# Patient Record
Sex: Male | Born: 1945 | Race: White | Hispanic: No | Smoking: Current every day smoker
Health system: Southern US, Community
[De-identification: ages and names within clinical notes are randomized; demographics above are authoritative.]

## PROBLEM LIST (undated history)

## (undated) DIAGNOSIS — F0390 Unspecified dementia without behavioral disturbance: Secondary | ICD-10-CM

## (undated) DIAGNOSIS — R569 Unspecified convulsions: Secondary | ICD-10-CM

## (undated) DIAGNOSIS — C801 Malignant (primary) neoplasm, unspecified: Secondary | ICD-10-CM

## (undated) DIAGNOSIS — F419 Anxiety disorder, unspecified: Secondary | ICD-10-CM

## (undated) DIAGNOSIS — F319 Bipolar disorder, unspecified: Secondary | ICD-10-CM

## (undated) DIAGNOSIS — F4310 Post-traumatic stress disorder, unspecified: Secondary | ICD-10-CM

## (undated) DIAGNOSIS — G4730 Sleep apnea, unspecified: Secondary | ICD-10-CM

---

## 2021-10-20 ENCOUNTER — Emergency Department (HOSPITAL_COMMUNITY): Payer: No Typology Code available for payment source

## 2021-10-20 ENCOUNTER — Encounter (HOSPITAL_COMMUNITY): Payer: Self-pay

## 2021-10-20 ENCOUNTER — Other Ambulatory Visit: Payer: Self-pay

## 2021-10-20 ENCOUNTER — Inpatient Hospital Stay (HOSPITAL_COMMUNITY)
Admission: EM | Admit: 2021-10-20 | Discharge: 2021-10-25 | DRG: 446 | Disposition: A | Discharge status: Home / Self Care | Payer: No Typology Code available for payment source | Attending: Internal Medicine | Admitting: Internal Medicine

## 2021-10-20 ENCOUNTER — Inpatient Hospital Stay (HOSPITAL_COMMUNITY): Payer: No Typology Code available for payment source

## 2021-10-20 DIAGNOSIS — Z79899 Other long term (current) drug therapy: Secondary | ICD-10-CM

## 2021-10-20 DIAGNOSIS — N281 Cyst of kidney, acquired: Secondary | ICD-10-CM | POA: Diagnosis present

## 2021-10-20 DIAGNOSIS — K8689 Other specified diseases of pancreas: Secondary | ICD-10-CM | POA: Diagnosis present

## 2021-10-20 DIAGNOSIS — F17210 Nicotine dependence, cigarettes, uncomplicated: Secondary | ICD-10-CM | POA: Diagnosis present

## 2021-10-20 DIAGNOSIS — Z87820 Personal history of traumatic brain injury: Secondary | ICD-10-CM | POA: Diagnosis not present

## 2021-10-20 DIAGNOSIS — N400 Enlarged prostate without lower urinary tract symptoms: Secondary | ICD-10-CM | POA: Diagnosis present

## 2021-10-20 DIAGNOSIS — N289 Disorder of kidney and ureter, unspecified: Secondary | ICD-10-CM | POA: Diagnosis not present

## 2021-10-20 DIAGNOSIS — R195 Other fecal abnormalities: Secondary | ICD-10-CM | POA: Insufficient documentation

## 2021-10-20 DIAGNOSIS — E7220 Disorder of urea cycle metabolism, unspecified: Secondary | ICD-10-CM

## 2021-10-20 DIAGNOSIS — F319 Bipolar disorder, unspecified: Secondary | ICD-10-CM | POA: Diagnosis present

## 2021-10-20 DIAGNOSIS — E876 Hypokalemia: Secondary | ICD-10-CM | POA: Diagnosis present

## 2021-10-20 DIAGNOSIS — K869 Disease of pancreas, unspecified: Secondary | ICD-10-CM | POA: Diagnosis present

## 2021-10-20 DIAGNOSIS — K3189 Other diseases of stomach and duodenum: Secondary | ICD-10-CM | POA: Diagnosis not present

## 2021-10-20 DIAGNOSIS — R531 Weakness: Secondary | ICD-10-CM

## 2021-10-20 DIAGNOSIS — G40909 Epilepsy, unspecified, not intractable, without status epilepticus: Secondary | ICD-10-CM | POA: Diagnosis present

## 2021-10-20 DIAGNOSIS — K831 Obstruction of bile duct: Secondary | ICD-10-CM | POA: Diagnosis present

## 2021-10-20 DIAGNOSIS — R17 Unspecified jaundice: Secondary | ICD-10-CM | POA: Diagnosis present

## 2021-10-20 DIAGNOSIS — E782 Mixed hyperlipidemia: Secondary | ICD-10-CM | POA: Diagnosis present

## 2021-10-20 DIAGNOSIS — G4733 Obstructive sleep apnea (adult) (pediatric): Secondary | ICD-10-CM | POA: Diagnosis present

## 2021-10-20 DIAGNOSIS — G2581 Restless legs syndrome: Secondary | ICD-10-CM | POA: Diagnosis present

## 2021-10-20 DIAGNOSIS — R634 Abnormal weight loss: Secondary | ICD-10-CM | POA: Diagnosis not present

## 2021-10-20 DIAGNOSIS — F419 Anxiety disorder, unspecified: Secondary | ICD-10-CM | POA: Diagnosis present

## 2021-10-20 DIAGNOSIS — D649 Anemia, unspecified: Secondary | ICD-10-CM | POA: Diagnosis present

## 2021-10-20 DIAGNOSIS — Z7982 Long term (current) use of aspirin: Secondary | ICD-10-CM | POA: Diagnosis not present

## 2021-10-20 DIAGNOSIS — Z9889 Other specified postprocedural states: Secondary | ICD-10-CM | POA: Diagnosis not present

## 2021-10-20 DIAGNOSIS — K838 Other specified diseases of biliary tract: Secondary | ICD-10-CM | POA: Diagnosis not present

## 2021-10-20 DIAGNOSIS — K2970 Gastritis, unspecified, without bleeding: Secondary | ICD-10-CM | POA: Diagnosis not present

## 2021-10-20 DIAGNOSIS — R7989 Other specified abnormal findings of blood chemistry: Secondary | ICD-10-CM | POA: Diagnosis not present

## 2021-10-20 HISTORY — DX: Sleep apnea, unspecified: G47.30

## 2021-10-20 HISTORY — DX: Anxiety disorder, unspecified: F41.9

## 2021-10-20 HISTORY — DX: Bipolar disorder, unspecified: F31.9

## 2021-10-20 HISTORY — DX: Post-traumatic stress disorder, unspecified: F43.10

## 2021-10-20 LAB — COMPREHENSIVE METABOLIC PANEL
ALT: 84 U/L — ABNORMAL HIGH (ref 0–44)
AST: 103 U/L — ABNORMAL HIGH (ref 15–41)
Albumin: 3 g/dL — ABNORMAL LOW (ref 3.5–5.0)
Alkaline Phosphatase: 373 U/L — ABNORMAL HIGH (ref 38–126)
Anion gap: 8 (ref 5–15)
BUN: 9 mg/dL (ref 8–23)
CO2: 26 mmol/L (ref 22–32)
Calcium: 9 mg/dL (ref 8.9–10.3)
Chloride: 105 mmol/L (ref 98–111)
Creatinine, Ser: 0.97 mg/dL (ref 0.61–1.24)
GFR, Estimated: >60 mL/min (ref >60)
Glucose, Bld: 94 mg/dL (ref 70–99)
Potassium: 3.4 mmol/L — ABNORMAL LOW (ref 3.5–5.1)
Sodium: 139 mmol/L (ref 135–145)
Total Bilirubin: 9.9 mg/dL — ABNORMAL HIGH (ref 0.3–1.2)
Total Protein: 7.1 g/dL (ref 6.5–8.1)

## 2021-10-20 LAB — CBC WITH DIFFERENTIAL/PLATELET
Abs Immature Granulocytes: 0.03 10*3/uL (ref 0.00–0.07)
Basophils Absolute: 0 10*3/uL (ref 0.0–0.1)
Basophils Relative: 0 %
Eosinophils Absolute: 0.1 10*3/uL (ref 0.0–0.5)
Eosinophils Relative: 2 %
HCT: 37.1 % — ABNORMAL LOW (ref 39.0–52.0)
Hemoglobin: 12.4 g/dL — ABNORMAL LOW (ref 13.0–17.0)
Immature Granulocytes: 1 %
Lymphocytes Relative: 19 %
Lymphs Abs: 1.1 10*3/uL (ref 0.7–4.0)
MCH: 31.1 pg (ref 26.0–34.0)
MCHC: 33.4 g/dL (ref 30.0–36.0)
MCV: 93 fL (ref 80.0–100.0)
Monocytes Absolute: 0.6 10*3/uL (ref 0.1–1.0)
Monocytes Relative: 11 %
Neutro Abs: 3.8 10*3/uL (ref 1.7–7.7)
Neutrophils Relative %: 67 %
Platelets: 219 10*3/uL (ref 150–400)
RBC: 3.99 MIL/uL — ABNORMAL LOW (ref 4.22–5.81)
RDW: 15.9 % — ABNORMAL HIGH (ref 11.5–15.5)
WBC: 5.6 10*3/uL (ref 4.0–10.5)
nRBC: 0 % (ref 0.0–0.2)

## 2021-10-20 LAB — URINALYSIS, ROUTINE W REFLEX MICROSCOPIC
Glucose, UA: 100 mg/dL — AB
Ketones, ur: NEGATIVE mg/dL
Nitrite: POSITIVE — AB
Protein, ur: 100 mg/dL — AB
Specific Gravity, Urine: 1.025 (ref 1.005–1.030)
pH: 6.5 (ref 5.0–8.0)

## 2021-10-20 LAB — URINALYSIS, MICROSCOPIC (REFLEX)

## 2021-10-20 LAB — PROTIME-INR
INR: 1.3 — ABNORMAL HIGH (ref 0.8–1.2)
Prothrombin Time: 16.4 seconds — ABNORMAL HIGH (ref 11.4–15.2)

## 2021-10-20 LAB — LIPASE, BLOOD: Lipase: 33 U/L (ref 11–51)

## 2021-10-20 MED ORDER — ACETAMINOPHEN 650 MG RE SUPP: 650.0000 mg | Freq: Four times a day (QID) | RECTAL | Status: DC | PRN

## 2021-10-20 MED ORDER — SODIUM CHLORIDE 0.9 % IV BOLUS
1000.0000 mL | Freq: Once | INTRAVENOUS | Status: AC
  Administered 2021-10-20: 1000 mL via INTRAVENOUS

## 2021-10-20 MED ORDER — ONDANSETRON HCL 4 MG PO TABS: 4.0000 mg | ORAL_TABLET | Freq: Four times a day (QID) | ORAL | Status: DC | PRN

## 2021-10-20 MED ORDER — POLYETHYLENE GLYCOL 3350 17 G PO PACK: 17.0000 g | PACK | Freq: Every day | ORAL | Status: DC | PRN

## 2021-10-20 MED ORDER — POTASSIUM CHLORIDE 2 MEQ/ML IV SOLN
INTRAVENOUS | Status: AC
  Filled 2021-10-20: qty 1000

## 2021-10-20 MED ORDER — ONDANSETRON HCL 4 MG/2ML IJ SOLN: 4.0000 mg | Freq: Four times a day (QID) | INTRAMUSCULAR | Status: DC | PRN

## 2021-10-20 MED ORDER — GADOBUTROL 1 MMOL/ML IV SOLN
7.0000 mL | Freq: Once | INTRAVENOUS | Status: AC | PRN
Start: 2021-10-20 — End: 2021-10-20
  Administered 2021-10-20: 7 mL via INTRAVENOUS

## 2021-10-20 MED ORDER — ONDANSETRON HCL 4 MG/2ML IJ SOLN
4.0000 mg | Freq: Once | INTRAMUSCULAR | Status: AC
  Administered 2021-10-20: 4 mg via INTRAVENOUS
  Filled 2021-10-20: qty 2

## 2021-10-20 MED ORDER — ACETAMINOPHEN 325 MG PO TABS: 650.0000 mg | ORAL_TABLET | Freq: Four times a day (QID) | ORAL | Status: DC | PRN

## 2021-10-20 MED ORDER — IOHEXOL 300 MG/ML  SOLN
100.0000 mL | Freq: Once | INTRAMUSCULAR | Status: AC | PRN
  Administered 2021-10-20: 100 mL via INTRAVENOUS

## 2021-10-20 MED ORDER — MORPHINE SULFATE (PF) 2 MG/ML IV SOLN
2.0000 mg | INTRAVENOUS | Status: DC | PRN
  Administered 2021-10-23: 2 mg via INTRAVENOUS
  Filled 2021-10-20: qty 1
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
## 2021-10-21 DIAGNOSIS — K869 Disease of pancreas, unspecified: Secondary | ICD-10-CM

## 2021-10-21 DIAGNOSIS — G40909 Epilepsy, unspecified, not intractable, without status epilepticus: Secondary | ICD-10-CM

## 2021-10-21 DIAGNOSIS — R17 Unspecified jaundice: Secondary | ICD-10-CM

## 2021-10-21 DIAGNOSIS — E782 Mixed hyperlipidemia: Secondary | ICD-10-CM | POA: Diagnosis present

## 2021-10-21 DIAGNOSIS — K831 Obstruction of bile duct: Principal | ICD-10-CM

## 2021-10-21 DIAGNOSIS — N400 Enlarged prostate without lower urinary tract symptoms: Secondary | ICD-10-CM | POA: Diagnosis present

## 2021-10-21 DIAGNOSIS — G2581 Restless legs syndrome: Secondary | ICD-10-CM | POA: Diagnosis present

## 2021-10-21 DIAGNOSIS — R634 Abnormal weight loss: Secondary | ICD-10-CM

## 2021-10-21 LAB — COMPREHENSIVE METABOLIC PANEL
ALT: 74 U/L — ABNORMAL HIGH (ref 0–44)
AST: 101 U/L — ABNORMAL HIGH (ref 15–41)
Albumin: 2.7 g/dL — ABNORMAL LOW (ref 3.5–5.0)
Alkaline Phosphatase: 360 U/L — ABNORMAL HIGH (ref 38–126)
Anion gap: 8 (ref 5–15)
BUN: 10 mg/dL (ref 8–23)
CO2: 25 mmol/L (ref 22–32)
Calcium: 8.6 mg/dL — ABNORMAL LOW (ref 8.9–10.3)
Chloride: 106 mmol/L (ref 98–111)
Creatinine, Ser: 0.92 mg/dL (ref 0.61–1.24)
GFR, Estimated: >60 mL/min (ref >60)
Glucose, Bld: 107 mg/dL — ABNORMAL HIGH (ref 70–99)
Potassium: 4.1 mmol/L (ref 3.5–5.1)
Sodium: 139 mmol/L (ref 135–145)
Total Bilirubin: 9.7 mg/dL — ABNORMAL HIGH (ref 0.3–1.2)
Total Protein: 6.2 g/dL — ABNORMAL LOW (ref 6.5–8.1)

## 2021-10-21 LAB — CBC WITH DIFFERENTIAL/PLATELET
Abs Immature Granulocytes: 0.03 10*3/uL (ref 0.00–0.07)
Basophils Absolute: 0 10*3/uL (ref 0.0–0.1)
Basophils Relative: 1 %
Eosinophils Absolute: 0.1 10*3/uL (ref 0.0–0.5)
Eosinophils Relative: 2 %
HCT: 33.8 % — ABNORMAL LOW (ref 39.0–52.0)
Hemoglobin: 11.5 g/dL — ABNORMAL LOW (ref 13.0–17.0)
Immature Granulocytes: 1 %
Lymphocytes Relative: 21 %
Lymphs Abs: 1.2 10*3/uL (ref 0.7–4.0)
MCH: 31 pg (ref 26.0–34.0)
MCHC: 34 g/dL (ref 30.0–36.0)
MCV: 91.1 fL (ref 80.0–100.0)
Monocytes Absolute: 0.5 10*3/uL (ref 0.1–1.0)
Monocytes Relative: 10 %
Neutro Abs: 3.6 10*3/uL (ref 1.7–7.7)
Neutrophils Relative %: 65 %
Platelets: 181 10*3/uL (ref 150–400)
RBC: 3.71 MIL/uL — ABNORMAL LOW (ref 4.22–5.81)
RDW: 15.8 % — ABNORMAL HIGH (ref 11.5–15.5)
WBC: 5.5 10*3/uL (ref 4.0–10.5)
nRBC: 0 % (ref 0.0–0.2)

## 2021-10-21 LAB — IRON AND TIBC
Iron: 43 ug/dL — ABNORMAL LOW (ref 45–182)
Saturation Ratios: 24 % (ref 17.9–39.5)
TIBC: 179 ug/dL — ABNORMAL LOW (ref 250–450)
UIBC: 136 ug/dL

## 2021-10-21 LAB — FERRITIN: Ferritin: 278 ng/mL (ref 24–336)

## 2021-10-21 LAB — MAGNESIUM: Magnesium: 1.5 mg/dL — ABNORMAL LOW (ref 1.7–2.4)

## 2021-10-21 LAB — BILIRUBIN, DIRECT: Bilirubin, Direct: 4.8 mg/dL — ABNORMAL HIGH (ref 0.0–0.2)

## 2021-10-21 LAB — AMMONIA: Ammonia: 80 umol/L — ABNORMAL HIGH (ref 9–35)

## 2021-10-21 MED ORDER — TAMSULOSIN HCL 0.4 MG PO CAPS
0.4000 mg | ORAL_CAPSULE | Freq: Every day | ORAL | Status: DC
  Administered 2021-10-21 – 2021-10-25 (×5): 0.4 mg via ORAL
  Filled 2021-10-21 (×5): qty 1

## 2021-10-21 MED ORDER — QUETIAPINE FUMARATE 50 MG PO TABS
100.0000 mg | ORAL_TABLET | Freq: Every day | ORAL | Status: DC
  Administered 2021-10-21 – 2021-10-24 (×4): 100 mg via ORAL
  Filled 2021-10-21 (×4): qty 2

## 2021-10-21 MED ORDER — ESCITALOPRAM OXALATE 10 MG PO TABS
5.0000 mg | ORAL_TABLET | Freq: Every day | ORAL | Status: DC
  Administered 2021-10-21 – 2021-10-25 (×5): 5 mg via ORAL
  Filled 2021-10-21 (×5): qty 1

## 2021-10-21 MED ORDER — ZOLPIDEM TARTRATE 5 MG PO TABS: 5.0000 mg | ORAL_TABLET | Freq: Every evening | ORAL | Status: DC | PRN

## 2021-10-21 MED ORDER — DIVALPROEX SODIUM ER 500 MG PO TB24
750.0000 mg | ORAL_TABLET | Freq: Every day | ORAL | Status: DC
  Administered 2021-10-21 – 2021-10-24 (×5): 750 mg via ORAL
  Filled 2021-10-21: qty 1
  Filled 2021-10-21 (×2): qty 3
  Filled 2021-10-21 (×2): qty 1
  Filled 2021-10-21: qty 3

## 2021-10-21 MED ORDER — ZOLPIDEM TARTRATE 5 MG PO TABS
5.0000 mg | ORAL_TABLET | Freq: Every evening | ORAL | Status: DC | PRN
  Administered 2021-10-21 – 2021-10-24 (×5): 5 mg via ORAL
  Filled 2021-10-21 (×5): qty 1

## 2021-10-21 MED ORDER — PRAZOSIN HCL 1 MG PO CAPS
1.0000 mg | ORAL_CAPSULE | Freq: Every day | ORAL | Status: DC
  Administered 2021-10-21 – 2021-10-24 (×4): 1 mg via ORAL
  Filled 2021-10-21 (×6): qty 1

## 2021-10-21 MED ORDER — DONEPEZIL HCL 5 MG PO TABS
5.0000 mg | ORAL_TABLET | Freq: Every day | ORAL | Status: DC
  Administered 2021-10-21 – 2021-10-25 (×5): 5 mg via ORAL
  Filled 2021-10-21 (×5): qty 1

## 2021-10-21 MED ORDER — MAGNESIUM SULFATE 2 GM/50ML IV SOLN
2.0000 g | Freq: Once | INTRAVENOUS | Status: AC
  Administered 2021-10-21: 2 g via INTRAVENOUS
  Filled 2021-10-21: qty 50

## 2021-10-21 MED ORDER — TRAZODONE HCL 150 MG PO TABS
75.0000 mg | ORAL_TABLET | Freq: Every evening | ORAL | Status: DC | PRN
Start: 2021-10-21 — End: 2021-10-25
  Administered 2021-10-23 – 2021-10-24 (×3): 75 mg via ORAL
  Filled 2021-10-21 (×3): qty 1

## 2021-10-21 MED ORDER — GABAPENTIN 100 MG PO CAPS
100.0000 mg | ORAL_CAPSULE | Freq: Three times a day (TID) | ORAL | Status: DC
  Administered 2021-10-21: 100 mg via ORAL
  Filled 2021-10-21: qty 1

## 2021-10-21 MED ORDER — TRAZODONE HCL 50 MG PO TABS: 100.0000 mg | ORAL_TABLET | Freq: Every day | ORAL | Status: DC

## 2021-10-21 MED ORDER — LEVETIRACETAM 500 MG PO TABS
1000.0000 mg | ORAL_TABLET | Freq: Two times a day (BID) | ORAL | Status: DC
  Administered 2021-10-21 – 2021-10-25 (×10): 1000 mg via ORAL
  Filled 2021-10-21 (×10): qty 2

## 2021-10-21 MED ORDER — ROSUVASTATIN CALCIUM 5 MG PO TABS
5.0000 mg | ORAL_TABLET | Freq: Every day | ORAL | Status: DC
  Administered 2021-10-21 – 2021-10-22 (×2): 5 mg via ORAL
  Filled 2021-10-21 (×2): qty 1

## 2021-10-21 MED ORDER — LACTULOSE 10 GM/15ML PO SOLN
20.0000 g | Freq: Two times a day (BID) | ORAL | Status: DC
  Administered 2021-10-21 – 2021-10-25 (×7): 20 g via ORAL
  Filled 2021-10-21 (×7): qty 30

## 2021-10-21 MED ORDER — ROPINIROLE HCL 0.5 MG PO TABS
1.0000 mg | ORAL_TABLET | Freq: Every day | ORAL | Status: DC
  Administered 2021-10-21 – 2021-10-24 (×5): 1 mg via ORAL
  Filled 2021-10-21: qty 1
  Filled 2021-10-21 (×2): qty 2
  Filled 2021-10-21 (×2): qty 1

## 2021-10-21 MED ORDER — FINASTERIDE 5 MG PO TABS
5.0000 mg | ORAL_TABLET | Freq: Every day | ORAL | Status: DC
  Administered 2021-10-21 – 2021-10-25 (×5): 5 mg via ORAL
  Filled 2021-10-21 (×5): qty 1

## 2021-10-21 MED ORDER — QUETIAPINE FUMARATE 200 MG PO TABS
200.0000 mg | ORAL_TABLET | Freq: Every day | ORAL | Status: DC
  Administered 2021-10-21: 200 mg via ORAL
  Filled 2021-10-21 (×2): qty 1

## 2021-10-21 NOTE — Assessment & Plan Note (Addendum)
Per my Dr. Elinor Parkinson with radiology, lesion of the left kidney appears to be a simple cyst on MRI. -Outpatient follow-up.

## 2021-10-21 NOTE — Assessment & Plan Note (Addendum)
-  Patient maintained on home regimen Keppra, depakote. -No seizures noted during the hospitalization.

## 2021-10-21 NOTE — Assessment & Plan Note (Addendum)
Patient was placed on Proscar during the hospitalization which was resumed.   -Outpatient follow-up.
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
## 2021-10-21 NOTE — Assessment & Plan Note (Addendum)
Hold crestor

## 2021-10-21 NOTE — Assessment & Plan Note (Addendum)
replace, follow
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
## 2021-10-21 NOTE — Assessment & Plan Note (Addendum)
Patient maintained on home regimen Seroquel and trazodone.   -Outpatient follow-up seroquel.
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
## 2021-10-21 NOTE — Assessment & Plan Note (Addendum)
Patient maintained on home regimen of Requip Not taking gabapentin per med rec

## 2021-10-21 NOTE — Assessment & Plan Note (Addendum)
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  Recommended further eval with ERCP/EUS. Afebrile, normal white count Elevated LFT's, T bili 9.0, alk phos 345, AST/ALT trending down Appreciate GI recs, planning for ERCP with EUS.  Question was posed as to whether he had capacity, he's Kerry [name]&O, is able to answer questions about why he's here generally and notes plan for surgery/procedure.  Currently demonstrates capacity to consent for procedure.  He tells me not to call his brother or Shirlean Mylar today (I tried to call [name] yesterday, but she didn't answer).

## 2021-10-22 DIAGNOSIS — E7220 Disorder of urea cycle metabolism, unspecified: Secondary | ICD-10-CM

## 2021-10-22 LAB — CBC WITH DIFFERENTIAL/PLATELET
Abs Immature Granulocytes: 0.02 10*3/uL (ref 0.00–0.07)
Basophils Absolute: 0 10*3/uL (ref 0.0–0.1)
Basophils Relative: 1 %
Eosinophils Absolute: 0.1 10*3/uL (ref 0.0–0.5)
Eosinophils Relative: 2 %
HCT: 30.6 % — ABNORMAL LOW (ref 39.0–52.0)
Hemoglobin: 10.6 g/dL — ABNORMAL LOW (ref 13.0–17.0)
Immature Granulocytes: 0 %
Lymphocytes Relative: 22 %
Lymphs Abs: 1.2 10*3/uL (ref 0.7–4.0)
MCH: 31.2 pg (ref 26.0–34.0)
MCHC: 34.6 g/dL (ref 30.0–36.0)
MCV: 90 fL (ref 80.0–100.0)
Monocytes Absolute: 0.6 10*3/uL (ref 0.1–1.0)
Monocytes Relative: 11 %
Neutro Abs: 3.4 10*3/uL (ref 1.7–7.7)
Neutrophils Relative %: 64 %
Platelets: 182 10*3/uL (ref 150–400)
RBC: 3.4 MIL/uL — ABNORMAL LOW (ref 4.22–5.81)
RDW: 15.9 % — ABNORMAL HIGH (ref 11.5–15.5)
WBC: 5.3 10*3/uL (ref 4.0–10.5)
nRBC: 0 % (ref 0.0–0.2)

## 2021-10-22 LAB — COMPREHENSIVE METABOLIC PANEL
ALT: 73 U/L — ABNORMAL HIGH (ref 0–44)
AST: 94 U/L — ABNORMAL HIGH (ref 15–41)
Albumin: 2.5 g/dL — ABNORMAL LOW (ref 3.5–5.0)
Alkaline Phosphatase: 345 U/L — ABNORMAL HIGH (ref 38–126)
Anion gap: 8 (ref 5–15)
BUN: 9 mg/dL (ref 8–23)
CO2: 25 mmol/L (ref 22–32)
Calcium: 8.2 mg/dL — ABNORMAL LOW (ref 8.9–10.3)
Chloride: 106 mmol/L (ref 98–111)
Creatinine, Ser: 0.8 mg/dL (ref 0.61–1.24)
GFR, Estimated: >60 mL/min (ref >60)
Glucose, Bld: 101 mg/dL — ABNORMAL HIGH (ref 70–99)
Potassium: 3.3 mmol/L — ABNORMAL LOW (ref 3.5–5.1)
Sodium: 139 mmol/L (ref 135–145)
Total Bilirubin: 9 mg/dL — ABNORMAL HIGH (ref 0.3–1.2)
Total Protein: 5.8 g/dL — ABNORMAL LOW (ref 6.5–8.1)

## 2021-10-22 LAB — PHOSPHORUS: Phosphorus: 3.5 mg/dL (ref 2.5–4.6)

## 2021-10-22 LAB — MAGNESIUM: Magnesium: 1.8 mg/dL (ref 1.7–2.4)

## 2021-10-22 LAB — AMMONIA: Ammonia: 55 umol/L — ABNORMAL HIGH (ref 9–35)
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
## 2021-10-22 NOTE — Assessment & Plan Note (Signed)
Rudolpho Claxton&O today, no asterixis Unclear significance Patient started on lactulose.   -Patient with no signs or symptoms of hepatic encephalopathy and as such lactulose discontinued on discharge.  -Outpatient follow-up with GI.

## 2021-10-23 ENCOUNTER — Encounter (HOSPITAL_COMMUNITY)
Admission: EM | Disposition: A | Discharge status: Home / Self Care | Payer: Self-pay | Source: Home / Self Care | Attending: Family Medicine

## 2021-10-23 ENCOUNTER — Inpatient Hospital Stay (HOSPITAL_COMMUNITY): Payer: No Typology Code available for payment source

## 2021-10-23 ENCOUNTER — Encounter (HOSPITAL_COMMUNITY): Payer: Self-pay | Admitting: Internal Medicine

## 2021-10-23 ENCOUNTER — Inpatient Hospital Stay (HOSPITAL_COMMUNITY): Payer: No Typology Code available for payment source | Admitting: Anesthesiology

## 2021-10-23 DIAGNOSIS — K838 Other specified diseases of biliary tract: Secondary | ICD-10-CM

## 2021-10-23 DIAGNOSIS — K831 Obstruction of bile duct: Secondary | ICD-10-CM

## 2021-10-23 DIAGNOSIS — K8689 Other specified diseases of pancreas: Secondary | ICD-10-CM

## 2021-10-23 DIAGNOSIS — K3189 Other diseases of stomach and duodenum: Secondary | ICD-10-CM

## 2021-10-23 DIAGNOSIS — K2970 Gastritis, unspecified, without bleeding: Secondary | ICD-10-CM

## 2021-10-23 HISTORY — PX: REMOVAL OF STONES: SHX5545

## 2021-10-23 HISTORY — PX: BILIARY STENT PLACEMENT: SHX5538

## 2021-10-23 HISTORY — PX: FINE NEEDLE ASPIRATION: SHX5430

## 2021-10-23 HISTORY — PX: UPPER ESOPHAGEAL ENDOSCOPIC ULTRASOUND (EUS): SHX6562

## 2021-10-23 HISTORY — PX: SPHINCTEROTOMY: SHX5279

## 2021-10-23 HISTORY — PX: BIOPSY: SHX5522

## 2021-10-23 HISTORY — PX: BILIARY BRUSHING: SHX6843

## 2021-10-23 HISTORY — PX: ERCP: SHX5425

## 2021-10-23 HISTORY — PX: ESOPHAGOGASTRODUODENOSCOPY (EGD) WITH PROPOFOL: SHX5813

## 2021-10-23 LAB — COMPREHENSIVE METABOLIC PANEL
ALT: 74 U/L — ABNORMAL HIGH (ref 0–44)
AST: 95 U/L — ABNORMAL HIGH (ref 15–41)
Albumin: 2.5 g/dL — ABNORMAL LOW (ref 3.5–5.0)
Alkaline Phosphatase: 375 U/L — ABNORMAL HIGH (ref 38–126)
Anion gap: 9 (ref 5–15)
BUN: 11 mg/dL (ref 8–23)
CO2: 24 mmol/L (ref 22–32)
Calcium: 8.7 mg/dL — ABNORMAL LOW (ref 8.9–10.3)
Chloride: 106 mmol/L (ref 98–111)
Creatinine, Ser: 0.76 mg/dL (ref 0.61–1.24)
GFR, Estimated: >60 mL/min (ref >60)
Glucose, Bld: 105 mg/dL — ABNORMAL HIGH (ref 70–99)
Potassium: 3 mmol/L — ABNORMAL LOW (ref 3.5–5.1)
Sodium: 139 mmol/L (ref 135–145)
Total Bilirubin: 10.9 mg/dL — ABNORMAL HIGH (ref 0.3–1.2)
Total Protein: 6.2 g/dL — ABNORMAL LOW (ref 6.5–8.1)

## 2021-10-23 LAB — CBC
HCT: 30.4 % — ABNORMAL LOW (ref 39.0–52.0)
Hemoglobin: 10.8 g/dL — ABNORMAL LOW (ref 13.0–17.0)
MCH: 31.3 pg (ref 26.0–34.0)
MCHC: 35.5 g/dL (ref 30.0–36.0)
MCV: 88.1 fL (ref 80.0–100.0)
Platelets: 190 10*3/uL (ref 150–400)
RBC: 3.45 MIL/uL — ABNORMAL LOW (ref 4.22–5.81)
RDW: 16.1 % — ABNORMAL HIGH (ref 11.5–15.5)
WBC: 5.3 10*3/uL (ref 4.0–10.5)
nRBC: 0 % (ref 0.0–0.2)

## 2021-10-23 LAB — PROTIME-INR
INR: 1.6 — ABNORMAL HIGH (ref 0.8–1.2)
Prothrombin Time: 19.1 seconds — ABNORMAL HIGH (ref 11.4–15.2)

## 2021-10-23 LAB — CANCER ANTIGEN 19-9: CA 19-9: 264 U/mL — ABNORMAL HIGH (ref 0–35)

## 2021-10-23 SURGERY — ESOPHAGOGASTRODUODENOSCOPY (EGD) WITH PROPOFOL
Anesthesia: General

## 2021-10-23 MED ORDER — PROPOFOL 10 MG/ML IV BOLUS
INTRAVENOUS | Status: DC | PRN
  Administered 2021-10-23: 100 mg via INTRAVENOUS

## 2021-10-23 MED ORDER — SUGAMMADEX SODIUM 200 MG/2ML IV SOLN
INTRAVENOUS | Status: DC | PRN
  Administered 2021-10-23: 200 mg via INTRAVENOUS

## 2021-10-23 MED ORDER — LACTATED RINGERS IV SOLN
INTRAVENOUS | Status: DC
  Administered 2021-10-23: 1000 mL via INTRAVENOUS

## 2021-10-23 MED ORDER — GLUCAGON HCL RDNA (DIAGNOSTIC) 1 MG IJ SOLR
INTRAMUSCULAR | Status: DC | PRN
  Administered 2021-10-23 (×2): .25 mg via INTRAVENOUS

## 2021-10-23 MED ORDER — LACTATED RINGERS IV SOLN: INTRAVENOUS | Status: DC | PRN

## 2021-10-23 MED ORDER — ONDANSETRON HCL 4 MG/2ML IJ SOLN
INTRAMUSCULAR | Status: DC | PRN
  Administered 2021-10-23: 4 mg via INTRAVENOUS

## 2021-10-23 MED ORDER — GLUCAGON HCL RDNA (DIAGNOSTIC) 1 MG IJ SOLR
INTRAMUSCULAR | Status: AC
  Filled 2021-10-23: qty 2

## 2021-10-23 MED ORDER — CIPROFLOXACIN IN D5W 400 MG/200ML IV SOLN
INTRAVENOUS | Status: AC
  Filled 2021-10-23: qty 200

## 2021-10-23 MED ORDER — INDOMETHACIN 50 MG RE SUPP
RECTAL | Status: DC | PRN
  Administered 2021-10-23: 100 mg via RECTAL

## 2021-10-23 MED ORDER — SODIUM CHLORIDE 0.9 % IV SOLN
INTRAVENOUS | Status: DC | PRN
  Administered 2021-10-23: 15 mL

## 2021-10-23 MED ORDER — POTASSIUM CHLORIDE 20 MEQ PO PACK
40.0000 meq | PACK | ORAL | Status: AC
  Administered 2021-10-23 (×2): 40 meq via ORAL
  Filled 2021-10-23 (×2): qty 2

## 2021-10-23 MED ORDER — INDOMETHACIN 50 MG RE SUPP
RECTAL | Status: AC
  Filled 2021-10-23: qty 2

## 2021-10-23 MED ORDER — DEXAMETHASONE SODIUM PHOSPHATE 10 MG/ML IJ SOLN
INTRAMUSCULAR | Status: DC | PRN
  Administered 2021-10-23: 10 mg via INTRAVENOUS

## 2021-10-23 MED ORDER — ROCURONIUM BROMIDE 10 MG/ML (PF) SYRINGE
PREFILLED_SYRINGE | INTRAVENOUS | Status: DC | PRN
  Administered 2021-10-23: 20 mg via INTRAVENOUS
  Administered 2021-10-23: 50 mg via INTRAVENOUS

## 2021-10-23 MED ORDER — LIDOCAINE 2% (20 MG/ML) 5 ML SYRINGE
INTRAMUSCULAR | Status: DC | PRN
  Administered 2021-10-23: 80 mg via INTRAVENOUS

## 2021-10-23 MED ORDER — CIPROFLOXACIN IN D5W 400 MG/200ML IV SOLN
INTRAVENOUS | Status: DC | PRN
  Administered 2021-10-23: 400 mg via INTRAVENOUS

## 2021-10-23 MED ORDER — EPINEPHRINE 1 MG/10ML IJ SOSY
PREFILLED_SYRINGE | INTRAMUSCULAR | Status: AC
  Filled 2021-10-23: qty 10

## 2021-10-23 MED ORDER — PHENYLEPHRINE HCL-NACL 20-0.9 MG/250ML-% IV SOLN
INTRAVENOUS | Status: DC | PRN
  Administered 2021-10-23: 50 ug/min via INTRAVENOUS

## 2021-10-23 SURGICAL SUPPLY — 15 items

## 2021-10-23 NOTE — Anesthesia Procedure Notes (Signed)
Procedure Name: Intubation Date/Time: 10/23/2021 12:01 PM Performed by: Georgia Duff, CRNA Pre-anesthesia Checklist: Patient identified, Emergency Drugs available, Suction available, Patient being monitored and Timeout performed Patient Re-evaluated:Patient Re-evaluated prior to induction Oxygen Delivery Method: Circle system utilized Preoxygenation: Pre-oxygenation with 100% oxygen Induction Type: IV induction Ventilation: Mask ventilation without difficulty Laryngoscope Size: Miller and 2 Grade View: Grade I Tube type: Oral Tube size: 7.5 mm Number of attempts: 1 Airway Equipment and Method: Stylet Placement Confirmation: ETT inserted through vocal cords under direct vision, positive ETCO2 and breath sounds checked- equal and bilateral Secured at: 21 cm Tube secured with: Tape Dental Injury: Teeth and Oropharynx as per pre-operative assessment

## 2021-10-23 NOTE — Op Note (Signed)
[facility] Patient Name: Kerry [name] Procedure Date : 10/23/2021 MRN: [id number] Attending MD: Justice Britain , MD Date of Birth: 03-23-1946 CSN: [id number] Age: 76 Admit Type: Inpatient Procedure:                ERCP Indications:              Common bile duct stricture, Abnormal abdominal CT,                            Abnormal MRCP, Biliary dilation on magnetic                            resonance cholangiopancreatography, Jaundice,                            Abnormal liver function test Providers:                Justice Britain, MD, Kary Kos RN, RN,                            William Dalton, Technician, Cletis Athens, Technician Referring MD:             Lajuan Lines. Pyrtle, MD, [facility] Medicines:                General Anesthesia, Cipro 400 mg IV, Indomethacin                            100 mg PR, Glucagon 0.5 mg IV Complications:            No immediate complications. Estimated Blood Loss:     Estimated blood loss was minimal. Procedure:                Pre-Anesthesia Assessment:                           - Prior to the procedure, a History and Physical                            was performed, and patient medications and                            allergies were reviewed. The patient's tolerance of                            previous anesthesia was also reviewed. The risks                            and benefits of the procedure and the sedation                            options and risks were discussed with the patient.                            All questions were answered, and informed consent  was obtained. Prior Anticoagulants: The patient has                            taken no previous anticoagulant or antiplatelet                            agents except for aspirin. ASA Grade Assessment:                            III - A patient with severe systemic disease. After                            reviewing the risks  and benefits, the patient was                            deemed in satisfactory condition to undergo the                            procedure.                           After obtaining informed consent, the scope was                            passed under direct vision. Throughout the                            procedure, the patient's blood pressure, pulse, and                            oxygen saturations were monitored continuously. The                            ERCP was accomplished without difficulty. The                            patient tolerated the procedure. The [id number]-Q190V                            ([id number]) Olympus duodenoscope was introduced                            through the mouth, and used to inject contrast into                            and used to inject contrast into the bile duct. Scope In: Scope Out: Findings:      The scout film was normal.      The esophagus was successfully intubated under direct vision without       detailed examination of the pharynx, larynx, and associated structures,       and upper GI tract. The major papilla was congested.      A short 0.035 inch Soft Jagwire was passed into the biliary tree on       first attempt. The Hydratome sphincterotome was  passed over the       guidewire and the bile duct was then deeply cannulated. Contrast was       injected. I personally interpreted the bile duct images. Ductal flow of       contrast was adequate. Image quality was adequate. Contrast extended to       the bifurcation. Opacification of the entire biliary tree except for the       cystic duct and gallbladder was successful. The lower third of the main       bile duct contained a single severe stenosis 20 mm in length. The middle       third of the main bile duct, upper third of the main bile duct and       hepatic duct bifurcation were severely dilated, secondary to       aforementioned stricture. The largest diameter was 17 mm. A 9 mm  biliary       sphincterotomy was made with a monofilament Hydratome sphincterotome       using ERBE electrocautery. There was no post-sphincterotomy bleeding. To       discover objects, the biliary tree was swept with a retrieval balloon.       Moderate amount of sludge was swept from the duct. Cells for cytology       were obtained by brushing in the lower third of the main bile duct       stricture by using 2 brushes. One 10 Fr by 5 cm plastic biliary stent       with a single external flap and a single internal flap was placed into       the common bile duct. Bile flowed through the stent. The stent was in       good position.      A pancreatogram was not performed.      The duodenoscope was withdrawn from the patient. Impression:               - The major papilla appeared congested.                           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 The                            stricture was malignant appearing. This was brushed                            x 2.                           - The upper third of the main bile duct and middle                            third of the main bile duct were severely dilated,                            secondary to aforementioned stricture.                           -  A biliary sphincterotomy was performed.                           - The biliary tree was swept and sludge was found.                           - One plastic biliary stent was placed into the                            common bile duct to traverse the stricture. Recommendation:           - The patient will be observed post-procedure,                            until all discharge criteria are met.                           - Return patient to hospital ward for ongoing care.                           - Advance diet as tolerated.                           - Observe patient's clinical course.                            - Check liver enzymes (AST, ALT, alkaline                            phosphatase, bilirubin) in the morning.                           - Await cytology results.                           - Follow up CA19-9 level.                           - Watch for pancreatitis, bleeding, perforation,                            and cholangitis.                           - If malignancy is confirmed then return to                            endoscopist for stent exchange at ERCP in 4-6                            months. Otherwise, if there is concern that we do                            not have a diagnosis, we will consider earlier  follow up ERCP and cholangioscopy.                           - The findings and recommendations were discussed                            with the patient.                           - The findings and recommendations were discussed                            with the referring physician. Procedure Code(s):        --- Professional ---                           [phone], Endoscopic retrograde                            cholangiopancreatography (ERCP); with placement of                            endoscopic stent into biliary or pancreatic duct,                            including pre- and post-dilation and guide wire                            passage, when performed, including sphincterotomy,                            when performed, each stent                           [id number], Endoscopic retrograde                            cholangiopancreatography (ERCP); with removal of                            calculi/debris from biliary/pancreatic duct(s) Diagnosis Code(s):        --- Professional ---                           K83.1, Obstruction of bile duct                           K83.9, Disease of biliary tract, unspecified                           R17, Unspecified jaundice                           R94.5, Abnormal results of liver function studies                            K83.8, Other specified diseases of biliary tract  R93.5, Abnormal findings on diagnostic imaging of                            other abdominal regions, including retroperitoneum                           R93.2, Abnormal findings on diagnostic imaging of                            liver and biliary tract CPT copyright 2019 [employer]. All rights reserved. The codes documented in this report are preliminary and upon coder review may  be revised to meet current compliance requirements. Justice Britain, MD 10/23/2021 2:02:01 PM Number of Addenda: 0

## 2021-10-23 NOTE — Transfer of Care (Signed)
Immediate Anesthesia Transfer of Care Note  Patient: Kerry [name]  Procedure(s) Performed: ESOPHAGOGASTRODUODENOSCOPY (EGD) WITH PROPOFOL UPPER ESOPHAGEAL ENDOSCOPIC ULTRASOUND (EUS) ENDOSCOPIC RETROGRADE CHOLANGIOPANCREATOGRAPHY (ERCP) BIOPSY FINE NEEDLE ASPIRATION (FNA) LINEAR  Patient Location: PACU  Anesthesia Type:General  Level of Consciousness: drowsy  Airway & Oxygen Therapy: Patient Spontanous Breathing and Patient connected to nasal cannula oxygen  Post-op Assessment: Report given to RN and Post -op Vital signs reviewed and stable  Post vital signs: Reviewed and stable  Last Vitals:  Vitals Value Taken Time  BP 100/43 10/23/21 1356  Temp 36.3 C 10/23/21 1356  Pulse 79 10/23/21 1357  Resp 18 10/23/21 1357  SpO2 94 % 10/23/21 1357  Vitals shown include unvalidated device data.  Last Pain:  Vitals:   10/23/21 1356  TempSrc: Temporal  PainSc: Asleep         Complications: No notable events documented.

## 2021-10-23 NOTE — Anesthesia Preprocedure Evaluation (Addendum)
Anesthesia Evaluation  Patient identified by MRN, date of birth, ID band Patient awake    Reviewed: Allergy & Precautions, NPO status , Patient's Chart, lab work & pertinent test results  Airway Mallampati: II  TM Distance: >3 FB Neck ROM: Full    Dental  (+) Teeth Intact, Implants, Dental Advisory Given   Pulmonary Current Smoker,    breath sounds clear to auscultation       Cardiovascular negative cardio ROS   Rhythm:Regular Rate:Normal     Neuro/Psych Seizures -,  PSYCHIATRIC DISORDERS Anxiety Bipolar Disorder    GI/Hepatic negative GI ROS, Neg liver ROS,   Endo/Other  negative endocrine ROS  Renal/GU      Musculoskeletal   Abdominal Normal abdominal exam  (+)   Peds  Hematology negative hematology ROS (+)   Anesthesia Other Findings   Reproductive/Obstetrics                            Anesthesia Physical Anesthesia Plan  ASA: 3  Anesthesia Plan: General   Post-op Pain Management:    Induction: Intravenous  PONV Risk Score and Plan: 2 and Ondansetron and Treatment may vary due to age or medical condition  Airway Management Planned: Oral ETT  Additional Equipment: None  Intra-op Plan:   Post-operative Plan: Extubation in OR  Informed Consent: I have reviewed the patients History and Physical, chart, labs and discussed the procedure including the risks, benefits and alternatives for the proposed anesthesia with the patient or authorized representative who has indicated his/her understanding and acceptance.     Dental advisory given  Plan Discussed with: CRNA  Anesthesia Plan Comments:        Anesthesia Quick Evaluation

## 2021-10-23 NOTE — Interval H&P Note (Signed)
History and Physical Interval Note:  10/23/2021 11:40 AM  Kerry [name]  has presented today for surgery, with the diagnosis of Malignant biliary obstruction.  The various methods of treatment have been discussed with the patient and family. After consideration of risks, benefits and other options for treatment, the patient has consented to  Procedure(s): ESOPHAGOGASTRODUODENOSCOPY (EGD) WITH PROPOFOL (N/A) UPPER ESOPHAGEAL ENDOSCOPIC ULTRASOUND (EUS) (N/A) ENDOSCOPIC RETROGRADE CHOLANGIOPANCREATOGRAPHY (ERCP) (N/A) as a surgical intervention.  The patient's history has been reviewed, patient examined, no change in status, stable for surgery.  I have reviewed the patient's chart and labs.  Questions were answered to the patient's satisfaction.     The risks of an EUS including intestinal perforation, bleeding, infection, aspiration, and medication effects were discussed as was the possibility it may not give a definitive diagnosis if a biopsy is performed.  When a biopsy of the pancreas is done as part of the EUS, there is an additional risk of pancreatitis at the rate of about 1-2%.  It was explained that procedure related pancreatitis is typically mild, although it can be severe and even life threatening, which is why we do not perform random pancreatic biopsies and only biopsy a lesion/area we feel is concerning enough to warrant the risk.   The risks of an ERCP were discussed at length, including but not limited to the risk of perforation, bleeding, abdominal pain, post-ERCP pancreatitis (while usually mild can be severe and even life threatening).   [employer]

## 2021-10-23 NOTE — Op Note (Signed)
[facility] Patient Name: Kerry [name] Procedure Date : 10/23/2021 MRN: [id number] Attending MD: Justice Britain , MD Date of Birth: April 25, 1946 CSN: [id number] Age: 76 Admit Type: Inpatient Procedure:                Upper EUS Indications:              Obstruction of bile duct on CT, Suspected mass in                            pancreas on MRCP, Obstruction of bile duct on MRCP,                            Abnormal lab work Providers:                Justice Britain, MD, Kary Kos RN, RN,                            William Dalton, Technician, Cletis Athens, Technician Referring MD:             Lajuan Lines. Pyrtle, MD, [facility] Medicines:                General Anesthesia Complications:            No immediate complications. Estimated Blood Loss:     Estimated blood loss was minimal. Procedure:                Pre-Anesthesia Assessment:                           - Prior to the procedure, a History and Physical                            was performed, and patient medications and                            allergies were reviewed. The patient's tolerance of                            previous anesthesia was also reviewed. The risks                            and benefits of the procedure and the sedation                            options and risks were discussed with the patient.                            All questions were answered, and informed consent                            was obtained. Prior Anticoagulants: The patient has                            taken no previous anticoagulant or antiplatelet  agents except for aspirin. ASA Grade Assessment:                            III - A patient with severe systemic disease. After                            reviewing the risks and benefits, the patient was                            deemed in satisfactory condition to undergo the                            procedure.                            After obtaining informed consent, the endoscope was                            passed under direct vision. Throughout the                            procedure, the patient's blood pressure, pulse, and                            oxygen saturations were monitored continuously. The                            [id number] Olympus endoscope was introduced                            through the mouth, and advanced to the second part                            of duodenum. The upper EUS was accomplished without                            difficulty. The patient tolerated the procedure.                            The [id number] Olympus duodenoscope was                            introduced through the mouth, and advanced to the                            area of papilla. The [id number]) Olympus                            linear ultrasound scope was introduced through the                            mouth, and advanced to the duodenum for ultrasound  examination from the stomach and duodenum. Findings:      ENDOSCOPIC FINDING: :      No gross lesions were noted in the entire esophagus.      The Z-line was regular and was found 40 cm from the incisors.      Patchy moderate inflammation characterized by erosions, erythema and       granularity was found in the entire examined stomach. Biopsies were       taken with a cold forceps for histology and Helicobacter pylori testing.      A j-shaped deformity was found of the stomach.      A moderately congested major papilla was found.      No other gross lesions were noted in the duodenal bulb, in the first       portion of the duodenum and in the second portion of the duodenum.      ENDOSONOGRAPHIC FINDING: :      A rounded mass-like region was identified in the superior pancreatic       head at the area where the CBD begins to dilate significantly. The mass       was hypoechoic. The mass measured  24 mm by 18 mm in maximal       cross-sectional diameter. The outer margins were irregular. There was       sonographic evidence suggesting abutment of the portal vein. An intact       interface was seen between the mass and the superior mesenteric artery       and celiac trunk suggesting a lack of invasion. The remainder of the       pancreas was examined. The endosonographic appearance of parenchyma and       the upstream pancreatic duct indicated no duct dilation (PDN - , PDB - ,       PDT - ). 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 Fine needle       biopsy was performed. Color Doppler imaging was utilized prior to needle       puncture to confirm a lack of significant vascular structures within the       needle path. Seven passes were made with the 22 gauge ultrasound core       biopsy needle using a transduodenal approach. A visible core of tissue       was obtained. Preliminary cytologic examination and touch preps were       performed. Final cytology results are pending.      Extensive hyperechoic material consistent with sludge was visualized       endosonographically in the gallbladder.      There was a suggestion of a stricture in the lower third of the main       bile duct.      There was dilation in the common bile duct and in the common hepatic       duct.      Endosonographic imaging of the ampulla showed no intramural       (subepithelial) lesion.      Endosonographic imaging in the visualized portion of the liver showed no       mass.      No malignant-appearing lymph nodes were visualized in the celiac region       (level 20), peripancreatic region and porta hepatis region.  The celiac region was visualized. Impression:               EGD Impression:                           - No gross lesions in esophagus. Z-line regular, 40                            cm from the incisors.                            - Gastritis. Biopsied.                           - J-shaped deformity.                           - Congested ampulla. No other gross lesions in the                            duodenal bulb, in the first portion of the duodenum                            and in the second portion of the duodenum.                           EUS Impression:                           - A mass was identified in the pancreatic head.                            Cytology results are pending. However, the                            endosonographic appearance is suspicious for                            malignancy. If this is an adenocarcinoma or a                            neuroendocrine tumor this was staged T2 N0 Mx by                            endosonographic criteria. The staging applies if                            malignancy is confirmed. Fine needle biopsy                            performed.                           - Hyperechoic material consistent with sludge was  visualized endosonographically in the gallbladder.                           - There was a suggestion of a stricture in the                            lower third of the main bile duct leading to                            dilation in the common bile duct and in the common                            hepatic duct.                           - No malignant-appearing lymph nodes were                            visualized in the celiac region (level 20),                            peripancreatic region and porta hepatis region. Recommendation:           - Proceed to scheduled ERCP.                           - Observe patient's clinical course.                           - Await cytology results and await path results.                           - The findings and recommendations were discussed                            with the patient.                           - The findings and recommendations were  discussed                            with the referring physician. Procedure Code(s):        --- Professional ---                           [phone], Esophagogastroduodenoscopy, flexible,                            transoral; with transendoscopic ultrasound-guided                            intramural or transmural fine needle                            aspiration/biopsy(s), (includes endoscopic  ultrasound examination limited to the esophagus,                            stomach or duodenum, and adjacent structures) Diagnosis Code(s):        --- Professional ---                           K29.70, Gastritis, unspecified, without bleeding                           K31.89, Other diseases of stomach and duodenum                           K86.89, Other specified diseases of pancreas                           K83.8, Other specified diseases of biliary tract                           I89.9, Noninfective disorder of lymphatic vessels                            and lymph nodes, unspecified                           K83.1, Obstruction of bile duct                           R79.9, Abnormal finding of blood chemistry,                            unspecified                           R93.3, Abnormal findings on diagnostic imaging of                            other parts of digestive tract                           R93.2, Abnormal findings on diagnostic imaging of                            liver and biliary tract CPT copyright 2019 American Medical Association. All rights reserved. The codes documented in this report are preliminary and upon coder review may  be revised to meet current compliance requirements. Justice Britain, MD 10/23/2021 2:05:01 PM Number of Addenda: 0
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
## 2021-10-23 NOTE — Anesthesia Postprocedure Evaluation (Signed)
Anesthesia Post Note  Patient: [name]  Procedure(s) Performed: ESOPHAGOGASTRODUODENOSCOPY (EGD) WITH PROPOFOL UPPER ESOPHAGEAL ENDOSCOPIC ULTRASOUND (EUS) ENDOSCOPIC RETROGRADE CHOLANGIOPANCREATOGRAPHY (ERCP) BIOPSY FINE NEEDLE ASPIRATION (FNA) LINEAR BILIARY BRUSHING BILIARY STENT PLACEMENT REMOVAL OF SLUDGE     Patient location during evaluation: PACU Anesthesia Type: General Level of consciousness: awake and alert Pain management: pain level controlled Vital Signs Assessment: post-procedure vital signs reviewed and stable Respiratory status: spontaneous breathing, nonlabored ventilation, respiratory function stable and patient connected to nasal cannula oxygen Cardiovascular status: blood pressure returned to baseline and stable Postop Assessment: no apparent nausea or vomiting Anesthetic complications: no   No notable events documented.  Last Vitals:  Vitals:   10/23/21 1425 10/23/21 1447  BP: (!) 104/40 117/62  Pulse: 66 (!) 59  Resp: 16 17  Temp:  (!) 36.4 C  SpO2: 96% 95%    Last Pain:  Vitals:   10/23/21 1447  TempSrc: Oral  PainSc:                  Shelton Silvas

## 2021-10-24 ENCOUNTER — Encounter (HOSPITAL_COMMUNITY): Payer: Self-pay | Admitting: Gastroenterology

## 2021-10-24 DIAGNOSIS — R7989 Other specified abnormal findings of blood chemistry: Secondary | ICD-10-CM

## 2021-10-24 DIAGNOSIS — Z9889 Other specified postprocedural states: Secondary | ICD-10-CM

## 2021-10-24 DIAGNOSIS — R195 Other fecal abnormalities: Secondary | ICD-10-CM

## 2021-10-24 LAB — CBC WITH DIFFERENTIAL/PLATELET
Abs Immature Granulocytes: 0.01 10*3/uL (ref 0.00–0.07)
Basophils Absolute: 0 10*3/uL (ref 0.0–0.1)
Basophils Relative: 0 %
Eosinophils Absolute: 0 10*3/uL (ref 0.0–0.5)
Eosinophils Relative: 0 %
HCT: 31.7 % — ABNORMAL LOW (ref 39.0–52.0)
Hemoglobin: 10.6 g/dL — ABNORMAL LOW (ref 13.0–17.0)
Immature Granulocytes: 0 %
Lymphocytes Relative: 13 %
Lymphs Abs: 0.6 10*3/uL — ABNORMAL LOW (ref 0.7–4.0)
MCH: 30.3 pg (ref 26.0–34.0)
MCHC: 33.4 g/dL (ref 30.0–36.0)
MCV: 90.6 fL (ref 80.0–100.0)
Monocytes Absolute: 0.4 10*3/uL (ref 0.1–1.0)
Monocytes Relative: 7 %
Neutro Abs: 3.9 10*3/uL (ref 1.7–7.7)
Neutrophils Relative %: 80 %
Platelets: 193 10*3/uL (ref 150–400)
RBC: 3.5 MIL/uL — ABNORMAL LOW (ref 4.22–5.81)
RDW: 16.1 % — ABNORMAL HIGH (ref 11.5–15.5)
WBC: 4.9 10*3/uL (ref 4.0–10.5)
nRBC: 0 % (ref 0.0–0.2)

## 2021-10-24 LAB — HEMOGLOBIN AND HEMATOCRIT, BLOOD
HCT: 33.2 % — ABNORMAL LOW (ref 39.0–52.0)
Hemoglobin: 11.3 g/dL — ABNORMAL LOW (ref 13.0–17.0)

## 2021-10-24 LAB — BASIC METABOLIC PANEL
Anion gap: 10 (ref 5–15)
BUN: 17 mg/dL (ref 8–23)
CO2: 23 mmol/L (ref 22–32)
Calcium: 8.8 mg/dL — ABNORMAL LOW (ref 8.9–10.3)
Chloride: 105 mmol/L (ref 98–111)
Creatinine, Ser: 0.77 mg/dL (ref 0.61–1.24)
GFR, Estimated: >60 mL/min (ref >60)
Glucose, Bld: 132 mg/dL — ABNORMAL HIGH (ref 70–99)
Potassium: 4.6 mmol/L (ref 3.5–5.1)
Sodium: 138 mmol/L (ref 135–145)

## 2021-10-24 LAB — MAGNESIUM: Magnesium: 1.8 mg/dL (ref 1.7–2.4)

## 2021-10-24 LAB — HEPATIC FUNCTION PANEL
ALT: 73 U/L — ABNORMAL HIGH (ref 0–44)
AST: 89 U/L — ABNORMAL HIGH (ref 15–41)
Albumin: 2.5 g/dL — ABNORMAL LOW (ref 3.5–5.0)
Alkaline Phosphatase: 370 U/L — ABNORMAL HIGH (ref 38–126)
Bilirubin, Direct: 3.6 mg/dL — ABNORMAL HIGH (ref 0.0–0.2)
Indirect Bilirubin: 3.3 mg/dL — ABNORMAL HIGH (ref 0.3–0.9)
Total Bilirubin: 6.9 mg/dL — ABNORMAL HIGH (ref 0.3–1.2)
Total Protein: 6.3 g/dL — ABNORMAL LOW (ref 6.5–8.1)

## 2021-10-24 LAB — SURGICAL PATHOLOGY

## 2021-10-24 LAB — PHOSPHORUS: Phosphorus: 3.4 mg/dL (ref 2.5–4.6)

## 2021-10-24 MED ORDER — PANTOPRAZOLE SODIUM 40 MG PO TBEC
40.0000 mg | DELAYED_RELEASE_TABLET | Freq: Two times a day (BID) | ORAL | Status: DC
  Administered 2021-10-24 – 2021-10-25 (×3): 40 mg via ORAL
  Filled 2021-10-24 (×3): qty 1

## 2021-10-24 NOTE — Plan of Care (Addendum)

## 2021-10-24 NOTE — Progress Notes (Signed)
Mobility Specialist Progress Note:   10/24/21 1040  Mobility  Activity Ambulated with assistance to bathroom  Level of Assistance Standby assist, set-up cues, supervision of patient - no hands on  Assistive Device None  Distance Ambulated (ft) 15 ft  Activity Response Tolerated fair  $Mobility charge 1 Mobility   Responded to bed alarm, pt received ambulating to BR with stool incontinence. Ambulated independently with no unsteadiness noted. Will f/u to get back to bed.   Addison L[facility] Secure Chat or Office Phone: [phone]

## 2021-10-24 NOTE — Assessment & Plan Note (Addendum)
He c/o dark stools, concern regarding GI bleed Follow Hb/Hct Appreciate GI recommendations
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
## 2021-10-24 NOTE — Progress Notes (Signed)
Gastroenterology Inpatient Follow-up Note   PATIENT IDENTIFICATION  Kerry [name] is a 76 y.o. male with a pmh significant for bipolar, OSA, anxiety, seizure disorder, RLS, prior SAH.  Hospitalized in setting of abnormal LFTs and obstructive jaundice with known CBD dilation and now status post ERCP/EUS. Hospital Day: 5  SUBJECTIVE  The patient's chart and labs were reviewed this morning with improving LFTs and stable hemogram. The patient denies fevers or chills. He has no abdominal pain. He has had chronic diarrhea since prior to admission.  This has continued overnight into this morning.  He did notice however that the stool was much darker in quality/characteristic today. The patient has no other significant concerns at this time other than wanting to have a diet.  OBJECTIVE  Scheduled Inpatient Medications:   divalproex  750 mg Oral QHS   donepezil  5 mg Oral Daily   escitalopram  5 mg Oral Daily   finasteride  5 mg Oral Daily   lactulose  20 g Oral BID   levETIRAcetam  1,000 mg Oral BID   prazosin  1 mg Oral QHS   QUEtiapine  100 mg Oral QHS   rOPINIRole  1 mg Oral QHS   tamsulosin  0.4 mg Oral Daily   Continuous Inpatient Infusions:  PRN Inpatient Medications: acetaminophen **OR** acetaminophen, morphine injection, ondansetron **OR** ondansetron (ZOFRAN) IV, polyethylene glycol, traZODone, zolpidem   Physical Examination  Temp:  [97.1 F (36.2 C)-98.2 F (36.8 C)] 97.7 F (36.5 C) (06/06 0826) Pulse Rate:  [51-79] 64 (06/06 0826) Resp:  [16-27] 18 (06/06 0826) BP: (100-121)/(40-62) 104/56 (06/06 0826) SpO2:  [93 %-97 %] 95 % (06/06 0826) Temp (24hrs), Avg:97.6 F (36.4 C), Min:97.1 F (36.2 C), Max:98.2 F (36.8 C)  Weight: 71.7 kg GEN: NAD, appears stated age, doesn't appear chronically ill PSYCH: Cooperative, without pressured speech EYE: Scleral icterus present ENT: MMM CV: Nontachycardic RESP: No audible wheezing GI: NABS, soft, NT/ND, without rebound  or guarding MSK/EXT: Trace bilateral lower extremity edema SKIN: Jaundiced NEURO:  Alert & Oriented x 3, no focal deficits   Review of Data   Laboratory Studies   Recent Labs  Lab 10/22/21 0046 10/23/21 0404 10/24/21 0151  NA 139   < > 138  K 3.3*   < > 4.6  CL 106   < > 105  CO2 25   < > 23  BUN 9   < > 17  CREATININE 0.80   < > 0.77  GLUCOSE 101*   < > 132*  CALCIUM 8.2*   < > 8.8*  MG 1.8  --  1.8  PHOS 3.5  --  3.4   < > = values in this interval not displayed.   Recent Labs  Lab 10/24/21 0151  AST 89*  ALT 73*  ALKPHOS 370*    Recent Labs  Lab 10/22/21 0046 10/23/21 0404 10/24/21 0151  WBC 5.3 5.3 4.9  HGB 10.6* 10.8* 10.6*  HCT 30.6* 30.4* 31.7*  PLT 182 190 193   Recent Labs  Lab 10/20/21 1802 10/23/21 0404  INR 1.3* 1.6*   MELD-Na score: 19 at 10/24/2021  1:51 AM MELD score: 19 at 10/24/2021  1:51 AM Calculated from: Serum Creatinine: 0.77 mg/dL (Using min of 1 mg/dL) at 1/6/10966/10/2021  0:451:51 AM Serum Sodium: 138 mmol/L (Using max of 137 mmol/L) at 10/24/2021  1:51 AM Total Bilirubin: 6.9 mg/dL at 4/0/98116/10/2021  9:141:51 AM INR(ratio): 1.6 at 10/23/2021  4:04 AM Age: 75 years  Imaging Studies  DG ERCP  Result Date: 10/23/2021 CLINICAL DATA:  Malignant obstruction EXAM: ERCP TECHNIQUE: Multiple spot images obtained with the fluoroscopic device and submitted for interpretation post-procedure. FLUOROSCOPY: Radiation Exposure Index (as provided by the fluoroscopic device): 25.19 mGy Kerma COMPARISON:  MRCP 10/20/2021 FINDINGS: A total of 8 intraoperative saved images are submitted for review. The images demonstrate a flexible duodenal scope in the descending duodenum followed by cannulation of the common bile duct. Cholangiography demonstrates intra and extrahepatic biliary ductal dilatation. There is complete obstruction in the mid to distal common bile duct. Subsequent images document placement of a plastic biliary stent. IMPRESSION: 1. Complete obstruction of the mid to  distal common bile duct with associated biliary ductal dilatation. 2. Placement of plastic biliary stent. These images were submitted for radiologic interpretation only. Please see the procedural report for the amount of contrast and the fluoroscopy time utilized. Electronically Signed   By: Malachy Moan M.D.   On: 10/23/2021 14:19    GI Procedures and Studies  EUS EGD Impression: - No gross lesions in esophagus. Z-line regular, 40 cm from the incisors. - Gastritis. Biopsied. - J-shaped deformity. - Congested ampulla. No other gross lesions in the duodenal bulb, in the first portion of the duodenum and in the second portion of the duodenum. EUS Impression: - A mass was identified in the pancreatic head. Cytology results are pending. However, the endosonographic appearance is suspicious for malignancy. If this is an adenocarcinoma or a neuroendocrine tumor this was staged T2 N0 Mx by endosonographic criteria. The staging applies if malignancy is confirmed. Fine needle biopsy performed. - Hyperechoic material consistent with sludge was visualized endosonographically in the gallbladder. - There was a suggestion of a stricture in the lower third of the main bile duct leading to dilation in the common bile duct and in the common hepatic duct. - No malignant-appearing lymph nodes were visualized in the celiac region (level 20), peripancreatic region and porta hepatis region.  ERCP - The major papilla appeared congested. - A single severe biliary stricture was found in the lower third of the main bile duct. The stricture was malignant appearing. This was brushed x 2. - The upper third of the main bile duct and middle third of the main bile duct were severely dilated, secondary to aforementioned stricture. - A biliary sphincterotomy was performed. - The biliary tree was swept and sludge was found. - One plastic biliary stent was placed into the common bile duct to traverse the  stricture.   ASSESSMENT  Mr. Kerry [name] is a 76 y.o. male with a pmh significant for bipolar, OSA, anxiety, seizure disorder, RLS, prior SAH.  Hospitalized in setting of abnormal LFTs and obstructive jaundice with known CBD dilation and now status post ERCP/EUS.  Patient is hemodynamically and clinically stable at this time.  He has done well status post his EUS/ERCP.  He has noted his stool to become darker in quality.  Certainly there is a risk of bleeding post sphincterotomy and postbiopsy but his hemogram is stable.  I recommend that we monitor the patient at least 1 more day.  We will recheck his hemogram this afternoon (order has been placed).  I would repeat his LFTs and formal CBC tomorrow.  I do not have any concerns with him going ahead and initiating a diet at this time.  However if he has a persistent drop in hemoglobin or persistent dark stool we may need to repeat upper endoscopic evaluation to ensure that there is no bleeding.  May initiate VTE prophylaxis tomorrow and for now keep him up and out of bed and SCDs if necessary/teds.  No biopsies or cytology back but certainly high concern for underlying malignant process.  Time will tell.  All patient questions were answered to the best of my ability, and the patient agrees to the aforementioned plan of action with follow-up as indicated.   PLAN/RECOMMENDATIONS  We will advance diet today Hemogram at 3 PM to monitor blood count CBC/CMP formally tomorrow If persistent or progressive anemia or persistent dark and tarry stools develop, may require repeat upper endoscopic evaluation We will follow-up cytology/pathology Stent exchange in a few months depending on finalization of pathology Please page/call with questions or concerns.   Corliss Parish, MD [facility] Office # [phone]    LOS: 4 days  Lemar Lofty  10/24/2021, 8:53 AM

## 2021-10-24 NOTE — Progress Notes (Signed)
Mobility Specialist Progress Note:   10/24/21 1100  Mobility  Activity Ambulated with assistance in room  Level of Assistance Standby assist, set-up cues, supervision of patient - no hands on  Assistive Device None  Distance Ambulated (ft) 15 ft  Activity Response Tolerated well  $Mobility charge 1 Mobility   Pt ambulated back to bed, linens changed. Left in bed with all needs met.   Nelta Numbers [facility] Secure Chat or Office Phone: [phone]
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
## 2021-10-25 ENCOUNTER — Encounter: Payer: Self-pay | Admitting: Gastroenterology

## 2021-10-25 DIAGNOSIS — E876 Hypokalemia: Secondary | ICD-10-CM

## 2021-10-25 DIAGNOSIS — N400 Enlarged prostate without lower urinary tract symptoms: Secondary | ICD-10-CM

## 2021-10-25 DIAGNOSIS — G40909 Epilepsy, unspecified, not intractable, without status epilepticus: Secondary | ICD-10-CM

## 2021-10-25 DIAGNOSIS — R531 Weakness: Secondary | ICD-10-CM

## 2021-10-25 DIAGNOSIS — E782 Mixed hyperlipidemia: Secondary | ICD-10-CM

## 2021-10-25 DIAGNOSIS — R17 Unspecified jaundice: Principal | ICD-10-CM

## 2021-10-25 DIAGNOSIS — N289 Disorder of kidney and ureter, unspecified: Secondary | ICD-10-CM

## 2021-10-25 DIAGNOSIS — E7220 Disorder of urea cycle metabolism, unspecified: Secondary | ICD-10-CM

## 2021-10-25 DIAGNOSIS — G2581 Restless legs syndrome: Secondary | ICD-10-CM

## 2021-10-25 DIAGNOSIS — F319 Bipolar disorder, unspecified: Secondary | ICD-10-CM

## 2021-10-25 LAB — CBC
HCT: 31.3 % — ABNORMAL LOW (ref 39.0–52.0)
Hemoglobin: 10.2 g/dL — ABNORMAL LOW (ref 13.0–17.0)
MCH: 30.1 pg (ref 26.0–34.0)
MCHC: 32.6 g/dL (ref 30.0–36.0)
MCV: 92.3 fL (ref 80.0–100.0)
Platelets: 197 10*3/uL (ref 150–400)
RBC: 3.39 MIL/uL — ABNORMAL LOW (ref 4.22–5.81)
RDW: 16.3 % — ABNORMAL HIGH (ref 11.5–15.5)
WBC: 6.2 10*3/uL (ref 4.0–10.5)
nRBC: 0 % (ref 0.0–0.2)

## 2021-10-25 LAB — COMPREHENSIVE METABOLIC PANEL
ALT: 95 U/L — ABNORMAL HIGH (ref 0–44)
AST: 123 U/L — ABNORMAL HIGH (ref 15–41)
Albumin: 2.4 g/dL — ABNORMAL LOW (ref 3.5–5.0)
Alkaline Phosphatase: 308 U/L — ABNORMAL HIGH (ref 38–126)
Anion gap: 7 (ref 5–15)
BUN: 17 mg/dL (ref 8–23)
CO2: 25 mmol/L (ref 22–32)
Calcium: 8.7 mg/dL — ABNORMAL LOW (ref 8.9–10.3)
Chloride: 108 mmol/L (ref 98–111)
Creatinine, Ser: 0.72 mg/dL (ref 0.61–1.24)
GFR, Estimated: >60 mL/min (ref >60)
Glucose, Bld: 94 mg/dL (ref 70–99)
Potassium: 3.7 mmol/L (ref 3.5–5.1)
Sodium: 140 mmol/L (ref 135–145)
Total Bilirubin: 5.1 mg/dL — ABNORMAL HIGH (ref 0.3–1.2)
Total Protein: 5.8 g/dL — ABNORMAL LOW (ref 6.5–8.1)

## 2021-10-25 LAB — CYTOLOGY - NON PAP

## 2021-10-25 MED ORDER — PANTOPRAZOLE SODIUM 40 MG PO TBEC: 40.0000 mg | DELAYED_RELEASE_TABLET | Freq: Every day | ORAL | 1 refills | Status: DC

## 2021-10-25 MED ORDER — NICOTINE 14 MG/24HR TD PT24: 14.0000 mg | MEDICATED_PATCH | Freq: Every day | TRANSDERMAL | 0 refills | Status: DC

## 2021-10-25 MED ORDER — ASPIRIN 81 MG PO TBEC: 81.0000 mg | DELAYED_RELEASE_TABLET | Freq: Every day | ORAL | 12 refills | Status: AC

## 2021-10-25 MED ORDER — NICOTINE 21 MG/24HR TD PT24: 21.0000 mg | MEDICATED_PATCH | Freq: Every day | TRANSDERMAL | Status: DC

## 2021-10-25 MED ORDER — ROSUVASTATIN CALCIUM 10 MG PO TABS: 5.0000 mg | ORAL_TABLET | Freq: Every day | ORAL | Status: AC

## 2021-10-25 NOTE — Progress Notes (Signed)
Pt states he lives at [facility]. I called them and asked them and they said that he does live there in the independent side. He has a key he showed me to his apt. Checking on transport back.

## 2021-10-25 NOTE — Progress Notes (Signed)
Obtained taxi voucher for ride back to [facility] living. DC instrucitons reviewed and copy given. All meds reviewed with patient and all follow up appts.

## 2021-10-25 NOTE — Progress Notes (Signed)
Mobility Specialist Progress Note:   10/25/21 1200  Mobility  Activity Ambulated with assistance in hallway  Level of Assistance Standby assist, set-up cues, supervision of patient - no hands on  Assistive Device None  Distance Ambulated (ft) 550 ft  Activity Response Tolerated well  $Mobility charge 1 Mobility   Patient eager for ambulation this am. Required no physical assist, only min guard for safety. No overt LOB noted. Pt back in bed with all needs met, encouraged frequent OOB mobility.  Nelta Numbers [facility] Secure Chat or Office Phone: [phone]
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
## 2021-10-25 NOTE — Assessment & Plan Note (Signed)
-   See #1 above.

## 2021-10-26 ENCOUNTER — Telehealth: Payer: Self-pay

## 2021-10-26 DIAGNOSIS — K831 Obstruction of bile duct: Secondary | ICD-10-CM

## 2021-10-26 NOTE — Telephone Encounter (Signed)
-----   Message from Lemar Lofty., MD sent at 10/26/2021  4:08 AM EDT ----- Regarding: Follow-up [facility], Please work on adding this patient for EUS/ERCP on June 19.  The case request has already been placed into the [facility].  However you need to arrange the other patients who are already there is fine.  Also, work with the patient's facility to have repeat LFTs in 1 week.  Thanks.  GM

## 2021-10-26 NOTE — Telephone Encounter (Signed)
Hey Kerry [name]! This patient needs to have an auth from the [place] to be seen with [place].  I know we did procedure for him while in ER, but [facility] will have [facility] we do outpatient.  He will have to let his primary know at the [place].

## 2021-10-26 NOTE — Telephone Encounter (Signed)
The pt is residing at [facility] Ind living  Left message on machine to call back   (Cell number-pt is independent living and does not have a nurse)

## 2021-10-26 NOTE — Telephone Encounter (Signed)
EUS ERCP 6/19 at 730 am at [facility] with [facility]   Left message on machine to call back

## 2021-10-27 NOTE — Telephone Encounter (Signed)
Left message on machine to call back  

## 2021-10-30 ENCOUNTER — Emergency Department (HOSPITAL_COMMUNITY): Payer: No Typology Code available for payment source

## 2021-10-30 ENCOUNTER — Emergency Department (HOSPITAL_COMMUNITY)
Admission: EM | Admit: 2021-10-30 | Discharge: 2021-10-30 | Disposition: A | Discharge status: Skilled Nursing Facility | Payer: No Typology Code available for payment source | Attending: Student | Admitting: Student

## 2021-10-30 DIAGNOSIS — Y92129 Unspecified place in nursing home as the place of occurrence of the external cause: Secondary | ICD-10-CM | POA: Diagnosis not present

## 2021-10-30 DIAGNOSIS — Y9301 Activity, walking, marching and hiking: Secondary | ICD-10-CM | POA: Insufficient documentation

## 2021-10-30 DIAGNOSIS — Z23 Encounter for immunization: Secondary | ICD-10-CM | POA: Diagnosis not present

## 2021-10-30 DIAGNOSIS — Z7982 Long term (current) use of aspirin: Secondary | ICD-10-CM | POA: Insufficient documentation

## 2021-10-30 DIAGNOSIS — S61012A Laceration without foreign body of left thumb without damage to nail, initial encounter: Secondary | ICD-10-CM | POA: Insufficient documentation

## 2021-10-30 DIAGNOSIS — F17210 Nicotine dependence, cigarettes, uncomplicated: Secondary | ICD-10-CM | POA: Insufficient documentation

## 2021-10-30 DIAGNOSIS — R4182 Altered mental status, unspecified: Secondary | ICD-10-CM | POA: Insufficient documentation

## 2021-10-30 DIAGNOSIS — L989 Disorder of the skin and subcutaneous tissue, unspecified: Secondary | ICD-10-CM | POA: Insufficient documentation

## 2021-10-30 DIAGNOSIS — S61011A Laceration without foreign body of right thumb without damage to nail, initial encounter: Secondary | ICD-10-CM | POA: Diagnosis present

## 2021-10-30 DIAGNOSIS — W19XXXA Unspecified fall, initial encounter: Secondary | ICD-10-CM | POA: Insufficient documentation

## 2021-10-30 DIAGNOSIS — S0081XA Abrasion of other part of head, initial encounter: Secondary | ICD-10-CM | POA: Diagnosis not present

## 2021-10-30 DIAGNOSIS — S0990XA Unspecified injury of head, initial encounter: Secondary | ICD-10-CM

## 2021-10-30 DIAGNOSIS — S63286A Dislocation of proximal interphalangeal joint of right little finger, initial encounter: Secondary | ICD-10-CM | POA: Insufficient documentation

## 2021-10-30 LAB — COMPREHENSIVE METABOLIC PANEL WITH GFR
ALT: 51 U/L — ABNORMAL HIGH (ref 0–44)
AST: 50 U/L — ABNORMAL HIGH (ref 15–41)
Albumin: 3.5 g/dL (ref 3.5–5.0)
Alkaline Phosphatase: 146 U/L — ABNORMAL HIGH (ref 38–126)
Anion gap: 9 (ref 5–15)
BUN: 13 mg/dL (ref 8–23)
CO2: 25 mmol/L (ref 22–32)
Calcium: 9.1 mg/dL (ref 8.9–10.3)
Chloride: 108 mmol/L (ref 98–111)
Creatinine, Ser: 0.76 mg/dL (ref 0.61–1.24)
GFR, Estimated: >60 mL/min
Glucose, Bld: 110 mg/dL — ABNORMAL HIGH (ref 70–99)
Potassium: 3.6 mmol/L (ref 3.5–5.1)
Sodium: 142 mmol/L (ref 135–145)
Total Bilirubin: 3.4 mg/dL — ABNORMAL HIGH (ref 0.3–1.2)
Total Protein: 7.2 g/dL (ref 6.5–8.1)

## 2021-10-30 LAB — CBC WITH DIFFERENTIAL/PLATELET
Abs Immature Granulocytes: 0.03 K/uL (ref 0.00–0.07)
Basophils Absolute: 0.1 K/uL (ref 0.0–0.1)
Basophils Relative: 1 %
Eosinophils Absolute: 0.1 K/uL (ref 0.0–0.5)
Eosinophils Relative: 1 %
HCT: 34.3 % — ABNORMAL LOW (ref 39.0–52.0)
Hemoglobin: 11.2 g/dL — ABNORMAL LOW (ref 13.0–17.0)
Immature Granulocytes: 0 %
Lymphocytes Relative: 20 %
Lymphs Abs: 1.9 K/uL (ref 0.7–4.0)
MCH: 31.2 pg (ref 26.0–34.0)
MCHC: 32.7 g/dL (ref 30.0–36.0)
MCV: 95.5 fL (ref 80.0–100.0)
Monocytes Absolute: 1.3 K/uL — ABNORMAL HIGH (ref 0.1–1.0)
Monocytes Relative: 14 %
Neutro Abs: 6.2 K/uL (ref 1.7–7.7)
Neutrophils Relative %: 64 %
Platelets: 279 K/uL (ref 150–400)
RBC: 3.59 MIL/uL — ABNORMAL LOW (ref 4.22–5.81)
RDW: 15.5 % (ref 11.5–15.5)
WBC: 9.6 K/uL (ref 4.0–10.5)
nRBC: 0 % (ref 0.0–0.2)

## 2021-10-30 LAB — URINALYSIS, ROUTINE W REFLEX MICROSCOPIC
Bacteria, UA: NONE SEEN
Bilirubin Urine: NEGATIVE
Glucose, UA: NEGATIVE mg/dL
Hgb urine dipstick: NEGATIVE
Ketones, ur: NEGATIVE mg/dL
Leukocytes,Ua: NEGATIVE
Nitrite: NEGATIVE
Protein, ur: 30 mg/dL — AB
Specific Gravity, Urine: 1.021 (ref 1.005–1.030)
pH: 8 (ref 5.0–8.0)

## 2021-10-30 LAB — TROPONIN I (HIGH SENSITIVITY)
Troponin I (High Sensitivity): 46 ng/L — ABNORMAL HIGH
Troponin I (High Sensitivity): 37 ng/L — ABNORMAL HIGH (ref <18)

## 2021-10-30 LAB — TYPE AND SCREEN
ABO/RH(D): O POS
Antibody Screen: NEGATIVE

## 2021-10-30 LAB — RAPID URINE DRUG SCREEN, HOSP PERFORMED
Amphetamines: NOT DETECTED
Barbiturates: NOT DETECTED
Benzodiazepines: NOT DETECTED
Cocaine: NOT DETECTED
Opiates: NOT DETECTED
Tetrahydrocannabinol: NOT DETECTED

## 2021-10-30 LAB — AMMONIA: Ammonia: 27 umol/L (ref 9–35)

## 2021-10-30 MED ORDER — NICOTINE 14 MG/24HR TD PT24
14.0000 mg | MEDICATED_PATCH | Freq: Every day | TRANSDERMAL | Status: DC
Start: 2021-10-30 — End: 2021-10-31

## 2021-10-30 MED ORDER — ESCITALOPRAM OXALATE 10 MG PO TABS
5.0000 mg | ORAL_TABLET | Freq: Every day | ORAL | Status: DC
  Administered 2021-10-30: 5 mg via ORAL
  Filled 2021-10-30: qty 1

## 2021-10-30 MED ORDER — QUETIAPINE FUMARATE 100 MG PO TABS
100.0000 mg | ORAL_TABLET | Freq: Every day | ORAL | Status: DC
  Administered 2021-10-30: 100 mg via ORAL
  Filled 2021-10-30: qty 1

## 2021-10-30 MED ORDER — ROPINIROLE HCL 1 MG PO TABS
1.0000 mg | ORAL_TABLET | Freq: Every day | ORAL | Status: DC
  Administered 2021-10-30: 1 mg via ORAL
  Filled 2021-10-30: qty 1

## 2021-10-30 MED ORDER — LEVETIRACETAM 500 MG PO TABS
1000.0000 mg | ORAL_TABLET | Freq: Two times a day (BID) | ORAL | Status: DC
  Administered 2021-10-30: 1000 mg via ORAL
  Filled 2021-10-30: qty 2

## 2021-10-30 MED ORDER — DIVALPROEX SODIUM ER 500 MG PO TB24
750.0000 mg | ORAL_TABLET | Freq: Every day | ORAL | Status: DC
  Administered 2021-10-30: 750 mg via ORAL
  Filled 2021-10-30: qty 1

## 2021-10-30 MED ORDER — TETANUS-DIPHTH-ACELL PERTUSSIS 5-2.5-18.5 LF-MCG/0.5 IM SUSY
0.5000 mL | PREFILLED_SYRINGE | Freq: Once | INTRAMUSCULAR | Status: AC
Start: 2021-10-30 — End: 2021-10-30
  Administered 2021-10-30: 0.5 mL via INTRAMUSCULAR
  Filled 2021-10-30: qty 0.5

## 2021-10-30 MED ORDER — DONEPEZIL HCL 5 MG PO TABS: 5.0000 mg | ORAL_TABLET | Freq: Every morning | ORAL | Status: DC

## 2021-10-30 MED ORDER — FINASTERIDE 5 MG PO TABS: 5.0000 mg | ORAL_TABLET | Freq: Every day | ORAL | Status: DC

## 2021-10-30 MED ORDER — LIDOCAINE HCL (PF) 1 % IJ SOLN
5.0000 mL | Freq: Once | INTRAMUSCULAR | Status: AC
  Administered 2021-10-30: 5 mL via INTRADERMAL
  Filled 2021-10-30: qty 30

## 2021-10-30 NOTE — Telephone Encounter (Signed)
Left message on machine to call back  

## 2021-10-30 NOTE — ED Notes (Signed)
Multiple attempts made to contact the facility.

## 2021-10-30 NOTE — Progress Notes (Signed)
Orthopedic Tech Progress Note Patient Details:  Kerry [name] 08-27-1945 [id number]  Patient ID: [name], male   DOB: Feb 02, 1946, 76 y.o.   MRN: [id number]  Saul Fordyce 10/30/2021, 1:24 PMCharge for Ortho supplies.  FINGER SPLINT
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
## 2021-10-30 NOTE — Telephone Encounter (Signed)
Dr Meridee Score FYI this pt is currently admitted

## 2021-10-31 NOTE — Telephone Encounter (Signed)
Looks like [facility] ED- he stated he was being admitted but looks like he was discharged.

## 2021-10-31 NOTE — Telephone Encounter (Signed)
I see that he was discharged after his finger issue was taking care of. He is already on the schedule for next Monday. Thanks. GM

## 2021-10-31 NOTE — Telephone Encounter (Signed)
I have faxed the information to the [facility] to get approval for the procedure.  I have also left several messages for the pt to inform him of the appt.  I have mailed the information to the pt by mail.

## 2021-10-31 NOTE — Telephone Encounter (Signed)
Will still need to have [facility] approval

## 2021-10-31 NOTE — Telephone Encounter (Signed)
Patty, What hospital is the patient admitted to? Thanks. GM

## 2021-10-31 NOTE — Telephone Encounter (Signed)
The pt will keep appt as planned.  

## 2021-11-01 NOTE — Telephone Encounter (Signed)
I have also attempted to reach the [facility] numbers with no response.

## 2021-11-01 NOTE — Telephone Encounter (Signed)
I have tried on multiple occasions to reach the pt by phone.  I have left several messages.  I did fax a request to the [facility] for PA.  I have notified Amy Mason Jim that this was completed.  I also mailed all the information to the pt home address.  I called the pt assisted living facility and tried to fax the information there but was told that the pt is assisted living and he does not have a nurse or anyone that helps him and I would need to speak to him directly.

## 2021-11-02 ENCOUNTER — Inpatient Hospital Stay (HOSPITAL_COMMUNITY)
Admission: EM | Admit: 2021-11-02 | Discharge: 2021-11-14 | DRG: 101 | Disposition: A | Discharge status: Home / Self Care | Payer: No Typology Code available for payment source | Source: Skilled Nursing Facility | Attending: Internal Medicine | Admitting: Internal Medicine

## 2021-11-02 ENCOUNTER — Emergency Department (HOSPITAL_COMMUNITY): Payer: No Typology Code available for payment source

## 2021-11-02 ENCOUNTER — Other Ambulatory Visit: Payer: Self-pay

## 2021-11-02 ENCOUNTER — Encounter (HOSPITAL_COMMUNITY): Payer: Self-pay | Admitting: Emergency Medicine

## 2021-11-02 DIAGNOSIS — Z888 Allergy status to other drugs, medicaments and biological substances status: Secondary | ICD-10-CM

## 2021-11-02 DIAGNOSIS — F17210 Nicotine dependence, cigarettes, uncomplicated: Secondary | ICD-10-CM | POA: Diagnosis present

## 2021-11-02 DIAGNOSIS — Z91148 Patient's other noncompliance with medication regimen for other reason: Secondary | ICD-10-CM

## 2021-11-02 DIAGNOSIS — Z960 Presence of urogenital implants: Secondary | ICD-10-CM | POA: Diagnosis present

## 2021-11-02 DIAGNOSIS — Z79899 Other long term (current) drug therapy: Secondary | ICD-10-CM

## 2021-11-02 DIAGNOSIS — R531 Weakness: Secondary | ICD-10-CM | POA: Diagnosis present

## 2021-11-02 DIAGNOSIS — T426X6A Underdosing of other antiepileptic and sedative-hypnotic drugs, initial encounter: Secondary | ICD-10-CM | POA: Diagnosis present

## 2021-11-02 DIAGNOSIS — E876 Hypokalemia: Secondary | ICD-10-CM | POA: Diagnosis present

## 2021-11-02 DIAGNOSIS — F4310 Post-traumatic stress disorder, unspecified: Secondary | ICD-10-CM | POA: Diagnosis present

## 2021-11-02 DIAGNOSIS — G40901 Epilepsy, unspecified, not intractable, with status epilepticus: Principal | ICD-10-CM | POA: Diagnosis present

## 2021-11-02 DIAGNOSIS — S0081XA Abrasion of other part of head, initial encounter: Secondary | ICD-10-CM | POA: Diagnosis present

## 2021-11-02 DIAGNOSIS — M6282 Rhabdomyolysis: Secondary | ICD-10-CM | POA: Diagnosis present

## 2021-11-02 DIAGNOSIS — G40909 Epilepsy, unspecified, not intractable, without status epilepticus: Secondary | ICD-10-CM

## 2021-11-02 DIAGNOSIS — Z87820 Personal history of traumatic brain injury: Secondary | ICD-10-CM

## 2021-11-02 DIAGNOSIS — I248 Other forms of acute ischemic heart disease: Secondary | ICD-10-CM | POA: Diagnosis present

## 2021-11-02 DIAGNOSIS — G4733 Obstructive sleep apnea (adult) (pediatric): Secondary | ICD-10-CM | POA: Diagnosis present

## 2021-11-02 DIAGNOSIS — R4182 Altered mental status, unspecified: Secondary | ICD-10-CM | POA: Diagnosis not present

## 2021-11-02 DIAGNOSIS — R569 Unspecified convulsions: Secondary | ICD-10-CM | POA: Diagnosis not present

## 2021-11-02 DIAGNOSIS — E785 Hyperlipidemia, unspecified: Secondary | ICD-10-CM | POA: Diagnosis present

## 2021-11-02 DIAGNOSIS — G4700 Insomnia, unspecified: Secondary | ICD-10-CM | POA: Diagnosis present

## 2021-11-02 DIAGNOSIS — Z79891 Long term (current) use of opiate analgesic: Secondary | ICD-10-CM

## 2021-11-02 DIAGNOSIS — G9341 Metabolic encephalopathy: Secondary | ICD-10-CM | POA: Diagnosis not present

## 2021-11-02 DIAGNOSIS — Z7982 Long term (current) use of aspirin: Secondary | ICD-10-CM | POA: Diagnosis not present

## 2021-11-02 DIAGNOSIS — W19XXXA Unspecified fall, initial encounter: Secondary | ICD-10-CM | POA: Diagnosis present

## 2021-11-02 DIAGNOSIS — G40919 Epilepsy, unspecified, intractable, without status epilepticus: Secondary | ICD-10-CM | POA: Diagnosis not present

## 2021-11-02 DIAGNOSIS — R17 Unspecified jaundice: Secondary | ICD-10-CM | POA: Diagnosis present

## 2021-11-02 DIAGNOSIS — F319 Bipolar disorder, unspecified: Secondary | ICD-10-CM | POA: Diagnosis present

## 2021-11-02 DIAGNOSIS — R410 Disorientation, unspecified: Principal | ICD-10-CM

## 2021-11-02 DIAGNOSIS — F05 Delirium due to known physiological condition: Secondary | ICD-10-CM | POA: Diagnosis not present

## 2021-11-02 LAB — URINALYSIS, ROUTINE W REFLEX MICROSCOPIC
Bilirubin Urine: NEGATIVE
Glucose, UA: NEGATIVE mg/dL
Ketones, ur: 20 mg/dL — AB
Leukocytes,Ua: NEGATIVE
Nitrite: NEGATIVE
Protein, ur: 30 mg/dL — AB
Specific Gravity, Urine: 1.019 (ref 1.005–1.030)
pH: 6 (ref 5.0–8.0)

## 2021-11-02 LAB — I-STAT CHEM 8, ED
BUN: 22 mg/dL (ref 8–23)
Calcium, Ion: 1.08 mmol/L — ABNORMAL LOW (ref 1.15–1.40)
Chloride: 110 mmol/L (ref 98–111)
Creatinine, Ser: 0.8 mg/dL (ref 0.61–1.24)
Glucose, Bld: 112 mg/dL — ABNORMAL HIGH (ref 70–99)
HCT: 34 % — ABNORMAL LOW (ref 39.0–52.0)
Hemoglobin: 11.6 g/dL — ABNORMAL LOW (ref 13.0–17.0)
Potassium: 3.4 mmol/L — ABNORMAL LOW (ref 3.5–5.1)
Sodium: 145 mmol/L (ref 135–145)
TCO2: 24 mmol/L (ref 22–32)

## 2021-11-02 LAB — CBC WITH DIFFERENTIAL/PLATELET
Abs Immature Granulocytes: 0.08 10*3/uL — ABNORMAL HIGH (ref 0.00–0.07)
Basophils Absolute: 0 10*3/uL (ref 0.0–0.1)
Basophils Relative: 0 %
Eosinophils Absolute: 0 10*3/uL (ref 0.0–0.5)
Eosinophils Relative: 0 %
HCT: 33.8 % — ABNORMAL LOW (ref 39.0–52.0)
Hemoglobin: 11.3 g/dL — ABNORMAL LOW (ref 13.0–17.0)
Immature Granulocytes: 1 %
Lymphocytes Relative: 9 %
Lymphs Abs: 1.2 10*3/uL (ref 0.7–4.0)
MCH: 31.4 pg (ref 26.0–34.0)
MCHC: 33.4 g/dL (ref 30.0–36.0)
MCV: 93.9 fL (ref 80.0–100.0)
Monocytes Absolute: 1.3 10*3/uL — ABNORMAL HIGH (ref 0.1–1.0)
Monocytes Relative: 9 %
Neutro Abs: 11.1 10*3/uL — ABNORMAL HIGH (ref 1.7–7.7)
Neutrophils Relative %: 81 %
Platelets: 209 10*3/uL (ref 150–400)
RBC: 3.6 MIL/uL — ABNORMAL LOW (ref 4.22–5.81)
RDW: 14.6 % (ref 11.5–15.5)
WBC: 13.7 10*3/uL — ABNORMAL HIGH (ref 4.0–10.5)
nRBC: 0 % (ref 0.0–0.2)

## 2021-11-02 LAB — I-STAT VENOUS BLOOD GAS, ED
Acid-Base Excess: 2 mmol/L (ref 0.0–2.0)
Bicarbonate: 24.6 mmol/L (ref 20.0–28.0)
Calcium, Ion: 1.11 mmol/L — ABNORMAL LOW (ref 1.15–1.40)
HCT: 34 % — ABNORMAL LOW (ref 39.0–52.0)
Hemoglobin: 11.6 g/dL — ABNORMAL LOW (ref 13.0–17.0)
O2 Saturation: 100 %
Potassium: 3.4 mmol/L — ABNORMAL LOW (ref 3.5–5.1)
Sodium: 145 mmol/L (ref 135–145)
TCO2: 26 mmol/L (ref 22–32)
pCO2, Ven: 29.6 mmHg — ABNORMAL LOW (ref 44–60)
pH, Ven: 7.529 — ABNORMAL HIGH (ref 7.25–7.43)
pO2, Ven: 175 mmHg — ABNORMAL HIGH (ref 32–45)

## 2021-11-02 LAB — AMMONIA: Ammonia: 20 umol/L (ref 9–35)

## 2021-11-02 LAB — VALPROIC ACID LEVEL: Valproic Acid Lvl: 29 ug/mL — ABNORMAL LOW (ref 50.0–100.0)

## 2021-11-02 LAB — COMPREHENSIVE METABOLIC PANEL
ALT: 37 U/L (ref 0–44)
AST: 47 U/L — ABNORMAL HIGH (ref 15–41)
Albumin: 3.3 g/dL — ABNORMAL LOW (ref 3.5–5.0)
Alkaline Phosphatase: 117 U/L (ref 38–126)
Anion gap: 14 (ref 5–15)
BUN: 21 mg/dL (ref 8–23)
CO2: 22 mmol/L (ref 22–32)
Calcium: 9.2 mg/dL (ref 8.9–10.3)
Chloride: 108 mmol/L (ref 98–111)
Creatinine, Ser: 1.07 mg/dL (ref 0.61–1.24)
GFR, Estimated: >60 mL/min (ref >60)
Glucose, Bld: 116 mg/dL — ABNORMAL HIGH (ref 70–99)
Potassium: 3.4 mmol/L — ABNORMAL LOW (ref 3.5–5.1)
Sodium: 144 mmol/L (ref 135–145)
Total Bilirubin: 3.4 mg/dL — ABNORMAL HIGH (ref 0.3–1.2)
Total Protein: 6.9 g/dL (ref 6.5–8.1)

## 2021-11-02 LAB — CK: Total CK: 1102 U/L — ABNORMAL HIGH (ref 49–397)

## 2021-11-02 LAB — LACTIC ACID, PLASMA: Lactic Acid, Venous: 2.3 mmol/L (ref 0.5–1.9)

## 2021-11-02 LAB — CBG MONITORING, ED: Glucose-Capillary: 119 mg/dL — ABNORMAL HIGH (ref 70–99)

## 2021-11-02 MED ORDER — POTASSIUM CHLORIDE 10 MEQ/100ML IV SOLN
10.0000 meq | INTRAVENOUS | Status: AC
  Administered 2021-11-02 – 2021-11-03 (×3): 10 meq via INTRAVENOUS
  Filled 2021-11-02 (×3): qty 100

## 2021-11-02 MED ORDER — LEVETIRACETAM IN NACL 1000 MG/100ML IV SOLN
1000.0000 mg | Freq: Two times a day (BID) | INTRAVENOUS | Status: DC
  Administered 2021-11-03 (×2): 1000 mg via INTRAVENOUS
  Filled 2021-11-02 (×2): qty 100

## 2021-11-02 MED ORDER — ASPIRIN 81 MG PO TBEC
81.0000 mg | DELAYED_RELEASE_TABLET | Freq: Every day | ORAL | Status: DC
  Administered 2021-11-04 – 2021-11-14 (×11): 81 mg via ORAL
  Filled 2021-11-02 (×11): qty 1

## 2021-11-02 MED ORDER — LACTATED RINGERS IV SOLN: INTRAVENOUS | Status: DC

## 2021-11-02 MED ORDER — ENOXAPARIN SODIUM 40 MG/0.4ML IJ SOSY
40.0000 mg | PREFILLED_SYRINGE | INTRAMUSCULAR | Status: DC
  Administered 2021-11-02 – 2021-11-06 (×4): 40 mg via SUBCUTANEOUS
  Filled 2021-11-02 (×4): qty 0.4

## 2021-11-02 MED ORDER — DIVALPROEX SODIUM ER 500 MG PO TB24
750.0000 mg | ORAL_TABLET | Freq: Every day | ORAL | Status: DC
  Filled 2021-11-02: qty 1

## 2021-11-02 MED ORDER — ESCITALOPRAM OXALATE 10 MG PO TABS
5.0000 mg | ORAL_TABLET | Freq: Every day | ORAL | Status: DC
  Administered 2021-11-04 – 2021-11-14 (×11): 5 mg via ORAL
  Filled 2021-11-02 (×11): qty 1

## 2021-11-02 MED ORDER — SODIUM CHLORIDE 0.9 % IV BOLUS
1000.0000 mL | Freq: Once | INTRAVENOUS | Status: AC
  Administered 2021-11-02: 1000 mL via INTRAVENOUS

## 2021-11-02 NOTE — H&P (Signed)
History and Physical  Kerry [name] YIR:[id number] DOB: Dec 11, 1945 DOA: 11/02/2021  Referring physician: Dr. Fredderick Phenix, EDP  PCP: [facility]  Outpatient Specialists: GI Patient coming from: Independent living facility  Chief Complaint: Altered mental status  HPI: Kerry [name] is a 76 y.o. male with medical history significant for bipolar disorder, seizure disorder, traumatic subarachnoid hemorrhage, PTSD, chronic anxiety/depression, tobacco use disorder, hyperlipidemia, obstructive sleep apnea, who presented to [facility] ED from independent living facility after being found in his apartment covered with urine and feces.  Last seen 2 days ago, was able to take care of his own ADLs.  He was brought into the ED for further evaluation.  Unable to obtain the history from the patient due to altered mental status.  Work-up in the ED revealed rhabdomyolysis, elevated lactic acid 2.3 which normalized after IV fluid hydration.  TRH was asked to admit for AMS work-up.  ED Course: Tmax 98.3.  BP 152/86, pulse 83, respiratory rate 27, saturation 93% on room air.  Lab studies remarkable for WBC 13.3, hemoglobin 11.3, MCV 93.  Platelet 209.  Review of Systems: Review of systems as noted in the HPI. All other systems reviewed and are negative.   Past Medical History:  Diagnosis Date   anxiety    Anxiety    Bipolar 1 disorder (HCC)    Sleep apnea    Past Surgical History:  Procedure Laterality Date   BILIARY BRUSHING  10/23/2021   Procedure: BILIARY BRUSHING;  Surgeon: Meridee Score Netty Starring., MD;  Location: [facility] ENDOSCOPY;  Service: Gastroenterology;;   BILIARY STENT PLACEMENT  10/23/2021   Procedure: BILIARY STENT PLACEMENT;  Surgeon: Lemar Lofty., MD;  Location: [facility] ENDOSCOPY;  Service: Gastroenterology;;   BIOPSY  10/23/2021   Procedure: BIOPSY;  Surgeon: Lemar Lofty., MD;  Location: [facility] ENDOSCOPY;  Service: Gastroenterology;;   ERCP N/A 10/23/2021   Procedure: ENDOSCOPIC  RETROGRADE CHOLANGIOPANCREATOGRAPHY (ERCP);  Surgeon: Lemar Lofty., MD;  Location: [facility] ENDOSCOPY;  Service: Gastroenterology;  Laterality: N/A;   ESOPHAGOGASTRODUODENOSCOPY (EGD) WITH PROPOFOL N/A 10/23/2021   Procedure: ESOPHAGOGASTRODUODENOSCOPY (EGD) WITH PROPOFOL;  Surgeon: Meridee Score Netty Starring., MD;  Location: [facility] ENDOSCOPY;  Service: Gastroenterology;  Laterality: N/A;   FINE NEEDLE ASPIRATION  10/23/2021   Procedure: FINE NEEDLE ASPIRATION (FNA) LINEAR;  Surgeon: Lemar Lofty., MD;  Location: [facility] ENDOSCOPY;  Service: Gastroenterology;;   REMOVAL OF STONES  10/23/2021   Procedure: REMOVAL OF SLUDGE;  Surgeon: Lemar Lofty., MD;  Location: [facility] ENDOSCOPY;  Service: Gastroenterology;;   [name]  10/23/2021   Procedure: [name];  Surgeon: Lemar Lofty., MD;  Location: [facility] ENDOSCOPY;  Service: Gastroenterology;;   UPPER ESOPHAGEAL ENDOSCOPIC ULTRASOUND (EUS) N/A 10/23/2021   Procedure: UPPER ESOPHAGEAL ENDOSCOPIC ULTRASOUND (EUS);  Surgeon: Lemar Lofty., MD;  Location: [facility] ENDOSCOPY;  Service: Gastroenterology;  Laterality: N/A;    Social History:  reports that he has been smoking cigarettes. He has never used smokeless tobacco. He reports that he does not drink alcohol and does not use drugs.   Allergies  Allergen Reactions   Fluoride Other (See Comments)     04/2014 Seizures and stroke after dental visit from using fluoride paste-per patient    Family History  Problem Relation Age of Onset   Heart disease Neg Hx       Prior to Admission medications   Medication Sig Start Date End Date Taking? Authorizing Provider  ALPRAZolam Prudy Feeler) 1 MG tablet Take 1 mg by mouth at bedtime as needed for sleep.  [provider]  ascorbic acid (VITAMIN C) 500 MG tablet Take 500 mg by mouth daily. Take with ferrous gluconate    [provider]  aspirin EC 81 MG tablet Take 1 tablet (81 mg total) by mouth daily. 10/28/21    Rodolph Bonghompson, Daniel V, MD  Cholecalciferol (VITAMIN D) 50 MCG (2000 UT) tablet Take 2,000 Units by mouth at bedtime.    [provider]  clotrimazole (LOTRIMIN) 1 % external solution Apply 1 application. topically See admin instructions. Apply small amount daily as needed for dystrophic nails    [provider]  divalproex (DEPAKOTE ER) 250 MG 24 hr tablet Take 750 mg by mouth at bedtime. For mood    [provider]  donepezil (ARICEPT) 5 MG tablet Take 5 mg by mouth every morning. For memory    [provider]  Emollient (CETAPHIL) cream Apply 1 application. topically daily.    [provider]  escitalopram (LEXAPRO) 10 MG tablet Take 5 mg by mouth daily. For mental health    [provider]  ferrous gluconate (FERGON) 324 MG tablet Take 324 mg by mouth daily. Take with Vitamin C/ascorbic acid    [provider]  finasteride (PROSCAR) 5 MG tablet Take 5 mg by mouth daily. For overactive bladder    [provider]  gabapentin (NEURONTIN) 100 MG capsule Take 100 mg by mouth in the morning, at noon, and at bedtime. Patient not taking: Reported on 10/21/2021 12/27/16   [provider]  levETIRAcetam (KEPPRA) 1000 MG tablet Take 1,000 mg by mouth in the morning and at bedtime. For seizures    [provider]  mupirocin ointment (BACTROBAN) 2 % Apply 1 application. topically 2 (two) times daily. For ingrown toenails    [provider]  nicotine (NICODERM CQ - DOSED IN MG/24 HOURS) 14 mg/24hr patch Place 1 patch (14 mg total) onto the skin daily. 10/25/21   Rodolph Bonghompson, Daniel V, MD  pantoprazole (PROTONIX) 40 MG tablet Take 1 tablet (40 mg total) by mouth daily. 10/25/21   Rodolph Bonghompson, Daniel V, MD  QUEtiapine (SEROQUEL) 100 MG tablet Take 100 mg by mouth at bedtime. For mood and sleep    [provider]  rOPINIRole (REQUIP) 1 MG tablet Take 1 mg by mouth at bedtime. For restless leg syndrome    [provider]  rosuvastatin (CRESTOR) 10 MG tablet Take 0.5 tablets (5 mg total) by mouth at bedtime. For cholesterol 11/01/21   Rodolph Bonghompson, Daniel V, MD  sildenafil (VIAGRA) 100 MG tablet Take 100 mg by mouth daily as needed for erectile dysfunction (one hour prior to sexual activity (do not exceed 1 dose in 24 hour period)).    [provider]  traZODone (DESYREL) 100 MG tablet Take 100 mg by mouth at bedtime. Patient not taking: Reported on 10/21/2021 11/16/19   [provider]  traZODone (DESYREL) 150 MG tablet Take 75 mg by mouth at bedtime as needed for sleep.    [provider]  Wound Cleansers (SKINTEGRITY WOUND EX) Apply 1 application. topically 2 (two) times daily. Topical spray    [provider]  zolpidem (AMBIEN) 10 MG tablet Take 5 mg by mouth at bedtime as needed for sleep.    [provider]    Physical Exam: BP (!) 152/86   Pulse 87   Temp 98.1 F (36.7 C) (Rectal)   Resp (!) 23   SpO2 91%   General: 76 y.o. year-old male frail-appearing, disheveled in no  acute distress.  Alert and confused.  Minimally verbal. Cardiovascular: Regular rate and rhythm with no rubs or gallops.  No thyromegaly or JVD noted.  No lower extremity edema. 2/4 pulses in all 4 extremities. Respiratory: Clear to auscultation with no wheezes or rales. Good inspiratory effort. Abdomen: Soft nontender nondistended with normal bowel sounds x4 quadrants. Muskuloskeletal: No cyanosis, clubbing or edema noted bilaterally Neuro: CN II-XII intact, strength, sensation, reflexes Skin: Bruising right side of forehead and temple region. Psychiatry: Judgement and insight appear altered. Mood is appropriate for condition and setting          Labs on Admission:  Basic Metabolic Panel: Recent Labs  Lab 10/30/21 0945 11/02/21 2041 11/02/21 2106  NA 142 144 145  145  K 3.6 3.4* 3.4*  3.4*  CL 108 108 110  CO2 25 22  --   GLUCOSE 110* 116* 112*  BUN 13 21 22    CREATININE 0.76 1.07 0.80  CALCIUM 9.1 9.2  --    Liver Function Tests: Recent Labs  Lab 10/30/21 0945 11/02/21 2041  AST 50* 47*  ALT 51* 37  ALKPHOS 146* 117  BILITOT 3.4* 3.4*  PROT 7.2 6.9  ALBUMIN 3.5 3.3*   No results for input(s): "LIPASE", "AMYLASE" in the last 168 hours. Recent Labs  Lab 10/30/21 0945 11/02/21 2041  AMMONIA 27 20   CBC: Recent Labs  Lab 10/30/21 0945 11/02/21 2041 11/02/21 2106  WBC 9.6 13.7*  --   NEUTROABS 6.2 11.1*  --   HGB 11.2* 11.3* 11.6*  11.6*  HCT 34.3* 33.8* 34.0*  34.0*  MCV 95.5 93.9  --   PLT 279 209  --    Cardiac Enzymes: Recent Labs  Lab 11/02/21 2041  CKTOTAL 1,102*    BNP (last 3 results) No results for input(s): "BNP" in the last 8760 hours.  ProBNP (last 3 results) No results for input(s): "PROBNP" in the last 8760 hours.  CBG: Recent Labs  Lab 11/02/21 2040  GLUCAP 119*    Radiological Exams on Admission: DG Chest Port 1 View  Result Date: 11/02/2021 CLINICAL DATA:  Unresponsive EXAM: PORTABLE CHEST 1 VIEW COMPARISON:  10/20/2021 FINDINGS: Lung volumes are small but are stable since prior examination. Mild right basilar atelectasis. No confluent pulmonary infiltrate. No pneumothorax or pleural effusion. Cardiac size within normal limits. No acute bone abnormality IMPRESSION: Pulmonary hypoinflation. Electronically Signed   By: 12/20/2021 M.D.   On: 11/02/2021 23:06   CT HEAD WO CONTRAST  Result Date: 11/02/2021 CLINICAL DATA:  Trauma. EXAM: CT HEAD WITHOUT CONTRAST CT MAXILLOFACIAL WITHOUT CONTRAST CT CERVICAL SPINE WITHOUT CONTRAST TECHNIQUE: Multidetector CT imaging of the head, cervical spine, and maxillofacial structures were performed using the standard protocol without intravenous contrast. Multiplanar CT image reconstructions of the cervical spine and maxillofacial structures were also generated. RADIATION DOSE REDUCTION: This exam was performed according to the departmental dose-optimization  program which includes automated exposure control, adjustment of the mA and/or kV according to patient size and/or use of iterative reconstruction technique. COMPARISON:  Head CT dated 10/30/2021. FINDINGS: CT HEAD FINDINGS Brain: Mild age-related atrophy and chronic microvascular ischemic changes. There is no acute intracranial hemorrhage. No mass effect or midline shift no extra-axial fluid collection. Vascular: No hyperdense vessel or unexpected calcification. Skull: Normal. Negative for fracture or focal lesion. Other: None. CT MAXILLOFACIAL FINDINGS Osseous: No acute fracture or dislocation. Orbits: The globes and retro-orbital fat are preserved. Sinuses: Clear. Soft tissues: Negative. CT CERVICAL SPINE FINDINGS Alignment: No subluxation.  Skull base and vertebrae: No acute fracture. Soft tissues and spinal canal: No prevertebral fluid or swelling. No visible canal hematoma. Disc levels:  No acute findings.  Mild degenerative changes. Upper chest: Negative. Other: Bilateral carotid bulb calcified plaques. IMPRESSION: 1. No acute intracranial pathology. Mild age-related atrophy and chronic microvascular ischemic changes. 2. No acute facial bone fractures. 3. No acute cervical spine pathology. Electronically Signed   By: Elgie Collard M.D.   On: 11/02/2021 22:59   CT CERVICAL SPINE WO CONTRAST  Result Date: 11/02/2021 CLINICAL DATA:  Trauma. EXAM: CT HEAD WITHOUT CONTRAST CT MAXILLOFACIAL WITHOUT CONTRAST CT CERVICAL SPINE WITHOUT CONTRAST TECHNIQUE: Multidetector CT imaging of the head, cervical spine, and maxillofacial structures were performed using the standard protocol without intravenous contrast. Multiplanar CT image reconstructions of the cervical spine and maxillofacial structures were also generated. RADIATION DOSE REDUCTION: This exam was performed according to the departmental dose-optimization program which includes automated exposure control, adjustment of the mA and/or kV according to patient  size and/or use of iterative reconstruction technique. COMPARISON:  Head CT dated 10/30/2021. FINDINGS: CT HEAD FINDINGS Brain: Mild age-related atrophy and chronic microvascular ischemic changes. There is no acute intracranial hemorrhage. No mass effect or midline shift no extra-axial fluid collection. Vascular: No hyperdense vessel or unexpected calcification. Skull: Normal. Negative for fracture or focal lesion. Other: None. CT MAXILLOFACIAL FINDINGS Osseous: No acute fracture or dislocation. Orbits: The globes and retro-orbital fat are preserved. Sinuses: Clear. Soft tissues: Negative. CT CERVICAL SPINE FINDINGS Alignment: No subluxation. Skull base and vertebrae: No acute fracture. Soft tissues and spinal canal: No prevertebral fluid or swelling. No visible canal hematoma. Disc levels:  No acute findings.  Mild degenerative changes. Upper chest: Negative. Other: Bilateral carotid bulb calcified plaques. IMPRESSION: 1. No acute intracranial pathology. Mild age-related atrophy and chronic microvascular ischemic changes. 2. No acute facial bone fractures. 3. No acute cervical spine pathology. Electronically Signed   By: Elgie Collard M.D.   On: 11/02/2021 22:59   CT Maxillofacial Wo Contrast  Result Date: 11/02/2021 CLINICAL DATA:  Trauma. EXAM: CT HEAD WITHOUT CONTRAST CT MAXILLOFACIAL WITHOUT CONTRAST CT CERVICAL SPINE WITHOUT CONTRAST TECHNIQUE: Multidetector CT imaging of the head, cervical spine, and maxillofacial structures were performed using the standard protocol without intravenous contrast. Multiplanar CT image reconstructions of the cervical spine and maxillofacial structures were also generated. RADIATION DOSE REDUCTION: This exam was performed according to the departmental dose-optimization program which includes automated exposure control, adjustment of the mA and/or kV according to patient size and/or use of iterative reconstruction technique. COMPARISON:  Head CT dated 10/30/2021. FINDINGS:  CT HEAD FINDINGS Brain: Mild age-related atrophy and chronic microvascular ischemic changes. There is no acute intracranial hemorrhage. No mass effect or midline shift no extra-axial fluid collection. Vascular: No hyperdense vessel or unexpected calcification. Skull: Normal. Negative for fracture or focal lesion. Other: None. CT MAXILLOFACIAL FINDINGS Osseous: No acute fracture or dislocation. Orbits: The globes and retro-orbital fat are preserved. Sinuses: Clear. Soft tissues: Negative. CT CERVICAL SPINE FINDINGS Alignment: No subluxation. Skull base and vertebrae: No acute fracture. Soft tissues and spinal canal: No prevertebral fluid or swelling. No visible canal hematoma. Disc levels:  No acute findings.  Mild degenerative changes. Upper chest: Negative. Other: Bilateral carotid bulb calcified plaques. IMPRESSION: 1. No acute intracranial pathology. Mild age-related atrophy and chronic microvascular ischemic changes. 2. No acute facial bone fractures. 3. No acute cervical spine pathology. Electronically Signed   By: Elgie Collard M.D.   On: 11/02/2021 22:59  EKG: I independently viewed the EKG done and my findings are as followed: Sinus rhythm rate of 88.  Nonspecific ST-T changes.  QTc 511.  Assessment/Plan Present on Admission:  AMS (altered mental status)  Principal Problem:   AMS (altered mental status)  Acute metabolic encephalopathy unclear etiology History of seizure disorder Check Keppra and valproic acid levels Ammonia level normal.  No respiratory acidosis/hypercarbia. Resume home medications Reorient as needed Fall/aspiration/delirium precautions Regular sleep and wake cycle Treat underlying conditions Noncontrast head CT negative for acute findings. Low threshold for MRI brain if no improvement  Seizure disorder Closely monitor on telemetry Restart home AEDs Seizure precautions  Rhabdomyolysis Found on the floor.  Unknown how long he was on the floor.  CPK on  admission 1100 IV fluid hydration Daily CPK  Bipolar disorder/chronic anxiety/depression/ Resume home regimen  Hypokalemia Serum potassium 3.3 Replete intravenously  Generalized weakness PT OT to assess Fall precautions  Prolonged Qtc Admission twelve-lead EKG with QTc of 511 Avoid QTc prolonging agents Optimize magnesium and potassium levels Repeat twelve-lead EKG in the morning   Critical care time: 65 minutes.    DVT prophylaxis: Subcu Lovenox daily  Code Status: Full code  Family Communication: None at bedside  Disposition Plan: Admitted to progressive unit  Consults called: None.  Admission status: Inpatient status   Status is: Inpatient The patient will require at least 2 midnights for further evaluation and treatment of present condition.   Darlin Drop MD [facility] Pager [phone]  If 7PM-7AM, please contact night-coverage [email or web address] Password [facility]  11/02/2021, 11:29 PM
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
## 2021-11-02 NOTE — ED Triage Notes (Signed)
Pt BIB [facility] from [facility], pt last seen normal by staff 2 days ago. Was found today on the floor in his room, covered in urine and feces. Staff reports at baseline pt, A&Ox4 and able to care for himself. Pt alert, but non-verbal at this time. Abrasion to right forehead and extremities. Given 1L LR by EMS. EMS VS: BP148/62, HR 88, SpO2 96% room air, CBG 128.

## 2021-11-02 NOTE — ED Notes (Signed)
Patient transported to CT 

## 2021-11-03 ENCOUNTER — Inpatient Hospital Stay (HOSPITAL_COMMUNITY): Payer: No Typology Code available for payment source

## 2021-11-03 DIAGNOSIS — Z91148 Patient's other noncompliance with medication regimen for other reason: Secondary | ICD-10-CM

## 2021-11-03 DIAGNOSIS — G40901 Epilepsy, unspecified, not intractable, with status epilepticus: Secondary | ICD-10-CM | POA: Diagnosis not present

## 2021-11-03 DIAGNOSIS — R4182 Altered mental status, unspecified: Secondary | ICD-10-CM | POA: Diagnosis not present

## 2021-11-03 DIAGNOSIS — R569 Unspecified convulsions: Secondary | ICD-10-CM

## 2021-11-03 LAB — COMPREHENSIVE METABOLIC PANEL
ALT: 37 U/L (ref 0–44)
AST: 53 U/L — ABNORMAL HIGH (ref 15–41)
Albumin: 2.9 g/dL — ABNORMAL LOW (ref 3.5–5.0)
Alkaline Phosphatase: 106 U/L (ref 38–126)
Anion gap: 12 (ref 5–15)
BUN: 17 mg/dL (ref 8–23)
CO2: 21 mmol/L — ABNORMAL LOW (ref 22–32)
Calcium: 8.6 mg/dL — ABNORMAL LOW (ref 8.9–10.3)
Chloride: 111 mmol/L (ref 98–111)
Creatinine, Ser: 0.91 mg/dL (ref 0.61–1.24)
GFR, Estimated: >60 mL/min (ref >60)
Glucose, Bld: 98 mg/dL (ref 70–99)
Potassium: 3.7 mmol/L (ref 3.5–5.1)
Sodium: 144 mmol/L (ref 135–145)
Total Bilirubin: 3.1 mg/dL — ABNORMAL HIGH (ref 0.3–1.2)
Total Protein: 6.5 g/dL (ref 6.5–8.1)

## 2021-11-03 LAB — CBC WITH DIFFERENTIAL/PLATELET
Abs Immature Granulocytes: 0.07 10*3/uL (ref 0.00–0.07)
Basophils Absolute: 0 10*3/uL (ref 0.0–0.1)
Basophils Relative: 0 %
Eosinophils Absolute: 0 10*3/uL (ref 0.0–0.5)
Eosinophils Relative: 0 %
HCT: 31.9 % — ABNORMAL LOW (ref 39.0–52.0)
Hemoglobin: 10.7 g/dL — ABNORMAL LOW (ref 13.0–17.0)
Immature Granulocytes: 1 %
Lymphocytes Relative: 16 %
Lymphs Abs: 1.8 10*3/uL (ref 0.7–4.0)
MCH: 31.5 pg (ref 26.0–34.0)
MCHC: 33.5 g/dL (ref 30.0–36.0)
MCV: 93.8 fL (ref 80.0–100.0)
Monocytes Absolute: 1.1 10*3/uL — ABNORMAL HIGH (ref 0.1–1.0)
Monocytes Relative: 10 %
Neutro Abs: 8.2 10*3/uL — ABNORMAL HIGH (ref 1.7–7.7)
Neutrophils Relative %: 73 %
Platelets: 221 10*3/uL (ref 150–400)
RBC: 3.4 MIL/uL — ABNORMAL LOW (ref 4.22–5.81)
RDW: 14.8 % (ref 11.5–15.5)
WBC: 11.2 10*3/uL — ABNORMAL HIGH (ref 4.0–10.5)
nRBC: 0 % (ref 0.0–0.2)

## 2021-11-03 LAB — I-STAT VENOUS BLOOD GAS, ED
Acid-Base Excess: 3 mmol/L — ABNORMAL HIGH (ref 0.0–2.0)
Bicarbonate: 25 mmol/L (ref 20.0–28.0)
Calcium, Ion: 1.09 mmol/L — ABNORMAL LOW (ref 1.15–1.40)
HCT: 32 % — ABNORMAL LOW (ref 39.0–52.0)
Hemoglobin: 10.9 g/dL — ABNORMAL LOW (ref 13.0–17.0)
O2 Saturation: 100 %
Potassium: 3.3 mmol/L — ABNORMAL LOW (ref 3.5–5.1)
Sodium: 146 mmol/L — ABNORMAL HIGH (ref 135–145)
TCO2: 26 mmol/L (ref 22–32)
pCO2, Ven: 29.2 mmHg — ABNORMAL LOW (ref 44–60)
pH, Ven: 7.54 — ABNORMAL HIGH (ref 7.25–7.43)
pO2, Ven: 183 mmHg — ABNORMAL HIGH (ref 32–45)

## 2021-11-03 LAB — URINE CULTURE

## 2021-11-03 LAB — LACTIC ACID, PLASMA: Lactic Acid, Venous: 1.2 mmol/L (ref 0.5–1.9)

## 2021-11-03 LAB — MAGNESIUM: Magnesium: 1.9 mg/dL (ref 1.7–2.4)

## 2021-11-03 LAB — CK: Total CK: 1319 U/L — ABNORMAL HIGH (ref 49–397)

## 2021-11-03 LAB — TROPONIN I (HIGH SENSITIVITY)
Troponin I (High Sensitivity): 46 ng/L — ABNORMAL HIGH (ref <18)
Troponin I (High Sensitivity): 47 ng/L — ABNORMAL HIGH (ref <18)

## 2021-11-03 LAB — PHOSPHORUS: Phosphorus: 2.3 mg/dL — ABNORMAL LOW (ref 2.5–4.6)

## 2021-11-03 MED ORDER — LORAZEPAM 2 MG/ML IJ SOLN
INTRAMUSCULAR | Status: AC
  Filled 2021-11-03: qty 1

## 2021-11-03 MED ORDER — VALPROIC ACID 250 MG/5ML PO SOLN
750.0000 mg | Freq: Every day | ORAL | Status: DC
Start: 2021-11-03 — End: 2021-11-03

## 2021-11-03 MED ORDER — VALPROIC ACID 250 MG/5ML PO SOLN
750.0000 mg | Freq: Every day | ORAL | Status: DC
  Filled 2021-11-03: qty 15

## 2021-11-03 MED ORDER — VALPROIC ACID 250 MG/5ML PO SOLN
1000.0000 mg | Freq: Every day | ORAL | Status: DC
Start: 2021-11-03 — End: 2021-11-03

## 2021-11-03 MED ORDER — VALPROATE SODIUM 100 MG/ML IV SOLN
450.0000 mg | Freq: Once | INTRAVENOUS | Status: AC
  Administered 2021-11-03: 450 mg via INTRAVENOUS
  Filled 2021-11-03: qty 4.5

## 2021-11-03 MED ORDER — LEVETIRACETAM IN NACL 1500 MG/100ML IV SOLN: 1500.0000 mg | Freq: Two times a day (BID) | INTRAVENOUS | Status: DC

## 2021-11-03 MED ORDER — DIVALPROEX SODIUM ER 500 MG PO TB24
1000.0000 mg | ORAL_TABLET | Freq: Every day | ORAL | Status: DC
  Administered 2021-11-03 – 2021-11-13 (×11): 1000 mg via ORAL
  Filled 2021-11-03 (×11): qty 2

## 2021-11-03 MED ORDER — LORAZEPAM 2 MG/ML IJ SOLN
2.0000 mg | INTRAMUSCULAR | Status: AC
  Administered 2021-11-03: 2 mg via INTRAVENOUS

## 2021-11-03 MED ORDER — POTASSIUM CHLORIDE 10 MEQ/100ML IV SOLN
10.0000 meq | INTRAVENOUS | Status: AC
  Administered 2021-11-03 (×2): 10 meq via INTRAVENOUS
  Filled 2021-11-03 (×2): qty 100

## 2021-11-03 MED ORDER — LEVETIRACETAM IN NACL 1000 MG/100ML IV SOLN
1000.0000 mg | Freq: Two times a day (BID) | INTRAVENOUS | Status: DC
  Administered 2021-11-03: 1000 mg via INTRAVENOUS
  Filled 2021-11-03: qty 100

## 2021-11-03 NOTE — Progress Notes (Signed)
EEG complete - results pending 

## 2021-11-03 NOTE — ED Notes (Addendum)
Noted pt had removed tele monitoring, on arrival to pt's room, pt noted to be lying on the floor. No new signs of trauma or change in mental status. Pt assisted back to bed with 2 person assistance. Pt had removed condom catheter and brief was soiled. Brief changed. Bed alarm set on hospital bed. Dr. Margo Aye made aware. No new orders at this time.

## 2021-11-03 NOTE — Progress Notes (Signed)
PT Cancellation Note  Patient Details Name: Kerry [name] MRN: [id number] DOB: [birth year]   Cancelled Treatment:    Reason Eval/Treat Not Completed: Fatigue/lethargy limiting ability to participate. Patient sleeping soundly after medications per nursing. Not appropriate for PT intervention at this time. Work-up also ongoing for possible seizure activity. PT to follow up as appropriate.   Donna Bernard, PT, MPT  Ina Homes 11/03/2021, 1:26 PM

## 2021-11-03 NOTE — Progress Notes (Signed)
vLTM started  all impedances below 10kohms   [facility] to monitor   Patient event button tested 

## 2021-11-03 NOTE — Progress Notes (Signed)
  Transition of Care [facility]) Screening Note   Patient Details  Name: Kerry [name] Date of Birth: 10-Aug-1945   Transition of Care [facility]) CM/SW Contact:    Baldemar Lenis, LCSW Phone Number: 11/03/2021, 10:59 AM    Transition of Care Department [facility]) has reviewed patient, medical workup ongoing at this time. We will continue to monitor patient advancement through interdisciplinary progression rounds. If new patient transition needs arise, please place a TOC consult.

## 2021-11-03 NOTE — ED Notes (Signed)
Pt placed in hospital bed, incontinent of urine, brief changed and condom catheter placed.

## 2021-11-03 NOTE — Progress Notes (Signed)
Progress NoteDemetri [name]    FHL:[id number] DOB: 19-Jan-1946 DOA: 11/02/2021  PCP: [facility]    Brief Narrative:    76 y.o. male with medical history significant for bipolar disorder, seizure disorder, traumatic subarachnoid hemorrhage, PTSD, chronic anxiety/depression, tobacco use disorder, hyperlipidemia, obstructive sleep apnea, who presented to [facility] ED from independent living facility after being found in his apartment covered with urine and feces.  Last seen 2 days ago, was able to take care of his own ADLs.  He was brought into the ED for further evaluation.   Subjective:   Seen the afternoon.  Encephalopathy resolved.  States he was not taking his Keppra.  When asked him why he stated "I forgot". \  Assessment and Plan:    Postictal encephalopathy: Resolved  Status epilepticus: Confirmed on EEG.  Undergoing continuous EEG monitoring.  Neurology following in consultation.  Resolved after 1 dose of Ativan IV.  Secondary to medication noncompliance.  Continue home Keppra IV.  Seizure precautions.  Seizure disorder: Plan as above  Bipolar disorder/chronic anxiety/depression: Continue home Depakote IV.  Valproic acid level low. Continue home Lexapro.  Mild rhabdo: Secondary to status epilepticus.  Continue IV fluids.  Repeat CPK in the morning.  No acute renal failure.  Elevated high-sensitivity troponins: Mild.  No chest pain.  Likely secondary to demand ischemia.  Generalized weakness: PT/OT and fall precautions  Obstructive jaundice: Status post recent EUS/ERCP with only atypical cells found. Dr. Francee Gentile had put him in for an urgent follow-up EUS this Monday at [facility] due to the nondiagnostic pathology to try and get more tissue/establish a diagnosis. Now planning to do on Monday inpatient if medically cleared. NPO Sun night. Otherwise will be done outpatient in July.   Other information:    DVT prophylaxis: Lovenox subcu Code  Status: Full code Family Communication: No family at bedside Disposition:   Status is: Inpatient Remains inpatient appropriate because: Status epilepticus resolved but awaiting neurology clearance    Consultants:   Neurology    Objective:    Vitals:   11/03/21 0205 11/03/21 0359 11/03/21 0914 11/03/21 1213  BP:  (!) 151/57 (!) 142/83 115/66  Pulse: 86 84 77 71  Resp: 18 19 20 20   Temp:  98.5 F (36.9 C) 97.7 F (36.5 C) 97.7 F (36.5 C)  TempSrc:  Oral Oral Oral  SpO2: 97% 97% 98% 100%    Intake/Output Summary (Last 24 hours) at 11/03/2021 1550 Last data filed at 11/03/2021 1500 Gross per 24 hour  Intake 1167.96 ml  Output --  Net 1167.96 ml     Physical Exam:    General exam: Appears calm and comfortable, chornically ill-appearing, thin and frail, EEG leads on scalp Respiratory system: Clear to auscultation. Respiratory effort normal. Cardiovascular system: S1 & S2 heard, RRR. No JVD, murmurs, rubs, gallops or clicks. No pedal edema. Gastrointestinal system: Abdomen is nondistended, soft and nontender. No organomegaly or masses felt. Normal bowel sounds heard. Central nervous system: Alert and oriented. No focal neurological deficits. Extremities: Symmetric 5 x 5 power. Skin: No rashes, lesions or ulcers Psychiatry: Judgement and insight appear normal. Mood & affect appropriate.     Data Reviewed:    I have personally reviewed following labs and imaging studies  CBC: Recent Labs  Lab 10/30/21 0945 11/02/21 2041 11/02/21 2106 11/03/21 0008 11/03/21 0542  WBC 9.6 13.7*  --   --  11.2*  NEUTROABS 6.2 11.1*  --   --  8.2*  HGB 11.2* 11.3* 11.6*  11.6* 10.9* 10.7*  HCT 34.3* 33.8* 34.0*  34.0* 32.0* 31.9*  MCV 95.5 93.9  --   --  93.8  PLT 279 209  --   --  221    Basic Metabolic Panel: Recent Labs  Lab 10/30/21 0945 11/02/21 2041 11/02/21 2106 11/03/21 0008 11/03/21 0542  NA 142 144 145  145 146* 144  K 3.6 3.4* 3.4*  3.4* 3.3* 3.7   CL 108 108 110  --  111  CO2 25 22  --   --  21*  GLUCOSE 110* 116* 112*  --  98  BUN 13 21 22   --  17  CREATININE 0.76 1.07 0.80  --  0.91  CALCIUM 9.1 9.2  --   --  8.6*  MG  --   --   --   --  1.9  PHOS  --   --   --   --  2.3*    GFR: Estimated Creatinine Clearance: 64.3 mL/min (by C-G formula based on SCr of 0.91 mg/dL).  Liver Function Tests: Recent Labs  Lab 10/30/21 0945 11/02/21 2041 11/03/21 0542  AST 50* 47* 53*  ALT 51* 37 37  ALKPHOS 146* 117 106  BILITOT 3.4* 3.4* 3.1*  PROT 7.2 6.9 6.5  ALBUMIN 3.5 3.3* 2.9*    CBG: Recent Labs  Lab 11/02/21 2040  GLUCAP 119*     Recent Results (from the past 240 hour(s))  Culture, blood (Routine X 2) w Reflex to ID Panel     Status: None (Preliminary result)   Collection Time: 11/02/21 11:52 PM   Specimen: BLOOD  Result Value Ref Range Status   Specimen Description BLOOD LEFT ANTECUBITAL  Final   Special Requests   Final    BOTTLES DRAWN AEROBIC AND ANAEROBIC Blood Culture adequate volume   Culture   Final    NO GROWTH < 12 HOURS Performed at [facility], [address], [place], [place]    Report Status PENDING  Incomplete  Culture, blood (Routine X 2) w Reflex to ID Panel     Status: None (Preliminary result)   Collection Time: 11/03/21 12:15 AM   Specimen: BLOOD RIGHT HAND  Result Value Ref Range Status   Specimen Description BLOOD RIGHT HAND  Final   Special Requests   Final    BOTTLES DRAWN AEROBIC AND ANAEROBIC Blood Culture results may not be optimal due to an excessive volume of blood received in culture bottles   Culture   Final    NO GROWTH < 12 HOURS Performed at [facility], [address] [place], [place]    Report Status PENDING  Incomplete         Radiology Studies:    EEG adult  Result Date: 11/03/2021 11/05/2021, MD     11/03/2021 10:17 AM Patient Name: Kerry [name] MRN: [name] Epilepsy Attending: [id number] Referring  Physician/Provider: Charlsie Quest, DO Date: 11/03/2021 Duration: 22.15 mins Patient history: 76 year old male with history of seizures on Keppra and Depakote presented with altered mental status.  EEG to evaluate for seizure. Level of alertness:  lethargic AEDs during EEG study: LEV Technical aspects: This EEG study was done with scalp electrodes positioned according to the 10-20 International system of electrode placement. Electrical activity was acquired at a sampling rate of 500Hz  and reviewed with a high frequency filter of 70Hz  and a low frequency filter of 1Hz .  EEG data were recorded continuously and digitally stored. Description: No clear posterior dominant rhythm was seen.  EEG showed continuous generalized and lateralized left hemisphere 3 to 7 Hz theta and delta slowing.  Frequent spikes were noted in left posterior quadrant which at times appear rhythmic without definite evolution lasting 2 to 7 seconds consistent with brief ictal-interictal rhythmic discharges.  Hyperventilation and photic stimulation were not performed.   ABNORMALITY -Brief ictal-interictal rhythmic discharges, left posterior quadrant -Continuous slow, generalized and lateralized left hemisphere IMPRESSION: This study showed evidence of epileptogenicity arising from left posterior quadrant which is on the ictal-interictal continuum with high potential for seizures.  Additionally there is cortical dysfunction in left hemisphere likely secondary to underlying structural abnormality, postictal state.  Lastly there is also moderate diffuse encephalopathy, nonspecific etiology but most likely related to seizures. Recommend long-term EEG monitoring for further evaluation of this EEG pattern. Dr. Thomasena Edis was notified. Charlsie Quest   DG Chest Port 1 View  Result Date: 11/02/2021 CLINICAL DATA:  Unresponsive EXAM: PORTABLE CHEST 1 VIEW COMPARISON:  10/20/2021 FINDINGS: Lung volumes are small but are stable since prior examination. Mild  right basilar atelectasis. No confluent pulmonary infiltrate. No pneumothorax or pleural effusion. Cardiac size within normal limits. No acute bone abnormality IMPRESSION: Pulmonary hypoinflation. Electronically Signed   By: Helyn Numbers M.D.   On: 11/02/2021 23:06   CT HEAD WO CONTRAST  Result Date: 11/02/2021 CLINICAL DATA:  Trauma. EXAM: CT HEAD WITHOUT CONTRAST CT MAXILLOFACIAL WITHOUT CONTRAST CT CERVICAL SPINE WITHOUT CONTRAST TECHNIQUE: Multidetector CT imaging of the head, cervical spine, and maxillofacial structures were performed using the standard protocol without intravenous contrast. Multiplanar CT image reconstructions of the cervical spine and maxillofacial structures were also generated. RADIATION DOSE REDUCTION: This exam was performed according to the departmental dose-optimization program which includes automated exposure control, adjustment of the mA and/or kV according to patient size and/or use of iterative reconstruction technique. COMPARISON:  Head CT dated 10/30/2021. FINDINGS: CT HEAD FINDINGS Brain: Mild age-related atrophy and chronic microvascular ischemic changes. There is no acute intracranial hemorrhage. No mass effect or midline shift no extra-axial fluid collection. Vascular: No hyperdense vessel or unexpected calcification. Skull: Normal. Negative for fracture or focal lesion. Other: None. CT MAXILLOFACIAL FINDINGS Osseous: No acute fracture or dislocation. Orbits: The globes and retro-orbital fat are preserved. Sinuses: Clear. Soft tissues: Negative. CT CERVICAL SPINE FINDINGS Alignment: No subluxation. Skull base and vertebrae: No acute fracture. Soft tissues and spinal canal: No prevertebral fluid or swelling. No visible canal hematoma. Disc levels:  No acute findings.  Mild degenerative changes. Upper chest: Negative. Other: Bilateral carotid bulb calcified plaques. IMPRESSION: 1. No acute intracranial pathology. Mild age-related atrophy and chronic microvascular ischemic  changes. 2. No acute facial bone fractures. 3. No acute cervical spine pathology. Electronically Signed   By: Elgie Collard M.D.   On: 11/02/2021 22:59   CT CERVICAL SPINE WO CONTRAST  Result Date: 11/02/2021 CLINICAL DATA:  Trauma. EXAM: CT HEAD WITHOUT CONTRAST CT MAXILLOFACIAL WITHOUT CONTRAST CT CERVICAL SPINE WITHOUT CONTRAST TECHNIQUE: Multidetector CT imaging of the head, cervical spine, and maxillofacial structures were performed using the standard protocol without intravenous contrast. Multiplanar CT image reconstructions of the cervical spine and maxillofacial structures were also generated. RADIATION DOSE REDUCTION: This exam was performed according to the departmental dose-optimization program which includes automated exposure control, adjustment of the mA and/or kV according to patient size and/or use of iterative reconstruction technique. COMPARISON:  Head CT dated 10/30/2021. FINDINGS: CT HEAD  FINDINGS Brain: Mild age-related atrophy and chronic microvascular ischemic changes. There is no acute intracranial hemorrhage. No mass effect or midline shift no extra-axial fluid collection. Vascular: No hyperdense vessel or unexpected calcification. Skull: Normal. Negative for fracture or focal lesion. Other: None. CT MAXILLOFACIAL FINDINGS Osseous: No acute fracture or dislocation. Orbits: The globes and retro-orbital fat are preserved. Sinuses: Clear. Soft tissues: Negative. CT CERVICAL SPINE FINDINGS Alignment: No subluxation. Skull base and vertebrae: No acute fracture. Soft tissues and spinal canal: No prevertebral fluid or swelling. No visible canal hematoma. Disc levels:  No acute findings.  Mild degenerative changes. Upper chest: Negative. Other: Bilateral carotid bulb calcified plaques. IMPRESSION: 1. No acute intracranial pathology. Mild age-related atrophy and chronic microvascular ischemic changes. 2. No acute facial bone fractures. 3. No acute cervical spine pathology. Electronically  Signed   By: Elgie Collard M.D.   On: 11/02/2021 22:59   CT Maxillofacial Wo Contrast  Result Date: 11/02/2021 CLINICAL DATA:  Trauma. EXAM: CT HEAD WITHOUT CONTRAST CT MAXILLOFACIAL WITHOUT CONTRAST CT CERVICAL SPINE WITHOUT CONTRAST TECHNIQUE: Multidetector CT imaging of the head, cervical spine, and maxillofacial structures were performed using the standard protocol without intravenous contrast. Multiplanar CT image reconstructions of the cervical spine and maxillofacial structures were also generated. RADIATION DOSE REDUCTION: This exam was performed according to the departmental dose-optimization program which includes automated exposure control, adjustment of the mA and/or kV according to patient size and/or use of iterative reconstruction technique. COMPARISON:  Head CT dated 10/30/2021. FINDINGS: CT HEAD FINDINGS Brain: Mild age-related atrophy and chronic microvascular ischemic changes. There is no acute intracranial hemorrhage. No mass effect or midline shift no extra-axial fluid collection. Vascular: No hyperdense vessel or unexpected calcification. Skull: Normal. Negative for fracture or focal lesion. Other: None. CT MAXILLOFACIAL FINDINGS Osseous: No acute fracture or dislocation. Orbits: The globes and retro-orbital fat are preserved. Sinuses: Clear. Soft tissues: Negative. CT CERVICAL SPINE FINDINGS Alignment: No subluxation. Skull base and vertebrae: No acute fracture. Soft tissues and spinal canal: No prevertebral fluid or swelling. No visible canal hematoma. Disc levels:  No acute findings.  Mild degenerative changes. Upper chest: Negative. Other: Bilateral carotid bulb calcified plaques. IMPRESSION: 1. No acute intracranial pathology. Mild age-related atrophy and chronic microvascular ischemic changes. 2. No acute facial bone fractures. 3. No acute cervical spine pathology. Electronically Signed   By: Elgie Collard M.D.   On: 11/02/2021 22:59        Medications:    Scheduled  Meds:  aspirin EC  81 mg Oral Daily   enoxaparin (LOVENOX) injection  40 mg Subcutaneous Q24H   escitalopram  5 mg Oral Daily   LORazepam       valproic acid  750 mg Oral QHS   Continuous Infusions:  lactated ringers 100 mL/hr at 11/03/21 0831   levETIRAcetam         LOS: 1 day    Time spent: 55 minutes    Verdia Kuba, MD [facility]   To contact the attending provider between 7A-7P or the covering provider during after hours 7P-7A, please log into the web site [email or web address] and access using universal [place] password for that web site. If you do not have the password, please call the hospital operator.  11/03/2021, 3:50 PM

## 2021-11-03 NOTE — Procedures (Signed)
Patient Name: Kerry [name]  MRN: [id number]  Epilepsy Attending: Charlsie Quest  Referring Physician/Provider: Verdia Kuba, DO  Date: 11/03/2021 Duration: 22.15 mins  Patient history: 76 year old male with history of seizures on Keppra and Depakote presented with altered mental status.  EEG to evaluate for seizure.  Level of alertness:  lethargic   AEDs during EEG study: LEV  Technical aspects: This EEG study was done with scalp electrodes positioned according to the 10-20 International system of electrode placement. Electrical activity was acquired at a sampling rate of 500Hz  and reviewed with a high frequency filter of 70Hz  and a low frequency filter of 1Hz . EEG data were recorded continuously and digitally stored.   Description: No clear posterior dominant rhythm was seen.  EEG showed continuous generalized and lateralized left hemisphere 3 to 7 Hz theta and delta slowing.  Frequent spikes were noted in left posterior quadrant which at times appear rhythmic without definite evolution lasting 2 to 7 seconds consistent with brief ictal-interictal rhythmic discharges.  Hyperventilation and photic stimulation were not performed.     ABNORMALITY -Brief ictal-interictal rhythmic discharges, left posterior quadrant -Continuous slow, generalized and lateralized left hemisphere  IMPRESSION: This study showed evidence of epileptogenicity arising from left posterior quadrant which is on the ictal-interictal continuum with high potential for seizures.  Additionally there is cortical dysfunction in left hemisphere likely secondary to underlying structural abnormality, postictal state.  Lastly there is also moderate diffuse encephalopathy, nonspecific etiology but most likely related to seizures.  Recommend long-term EEG monitoring for further evaluation of this EEG pattern.  Dr. was notified.  Kerry [name] 

## 2021-11-03 NOTE — Progress Notes (Signed)
Neurology Consultation  Reason for Consult: seizures Referring Physician: Linna Darner  CC: seizures  History is obtained from:Patient, family, chart  HPI: Kerry [name] is a 76 y.o. male with a PMH of bipolar disorder, seizure disorder, traumatic subarachnoid hemorrhage, PTSD, chronic anxiety/depression, tobacco use disorder, hyperlipidemia, obstructive sleep apnea, who presented to [facility] ED from independent living facility after being found in his apartment covered with urine and feces.  He is not compliant with his medications, he states that he forgets to take them, but he has no trouble with medication access as he gets them from the [place]. He has recently received ativan for status epilepticus seen on EEG and is now Ax02.    OAC:ZYSAYT to obtain due to altered mental status.   Past Medical History:  Diagnosis Date   anxiety    Anxiety    Bipolar 1 disorder (HCC)    Sleep apnea     Family History  Problem Relation Age of Onset   Heart disease Neg Hx     Social History:   reports that he has been smoking cigarettes. He has never used smokeless tobacco. He reports that he does not drink alcohol and does not use drugs.  Medications  Current Facility-Administered Medications:    aspirin EC tablet 81 mg, 81 mg, Oral, Daily, Hall, Carole N, DO   enoxaparin (LOVENOX) injection 40 mg, 40 mg, Subcutaneous, Q24H, Hall, Carole N, DO, 40 mg at 11/02/21 2351   escitalopram (LEXAPRO) tablet 5 mg, 5 mg, Oral, Daily, Hall, Carole N, DO   lactated ringers infusion, , Intravenous, Continuous, Lynwood Dawley S, DO, Last Rate: 100 mL/hr at 11/03/21 0831, Rate Change at 11/03/21 0831   levETIRAcetam (KEPPRA) IVPB 1000 mg/100 mL premix, 1,000 mg, Intravenous, BID, Dow Adolph N, DO, Last Rate: 400 mL/hr at 11/03/21 1007, 1,000 mg at 11/03/21 1007   LORazepam (ATIVAN) 2 MG/ML injection, , , ,    valproic acid (DEPAKENE) 250 MG/5ML solution 750 mg, 750 mg, Oral, QHS, Anwar, Shayan S, DO  Exam: Current  vital signs: BP 115/66 (BP Location: Right Arm)   Pulse 71   Temp 97.7 F (36.5 C) (Oral)   Resp 20   SpO2 100%  Vital signs in last 24 hours: Temp:  [97.7 F (36.5 C)-98.5 F (36.9 C)] 97.7 F (36.5 C) (06/16 1213) Pulse Rate:  [71-97] 71 (06/16 1213) Resp:  [14-30] 20 (06/16 1213) BP: (115-160)/(57-90) 115/66 (06/16 1213) SpO2:  [91 %-100 %] 100 % (06/16 1213)  GENERAL: Awake, alert, in no acute distress Psych: Affect appropriate for situation, patient is calm and cooperative with examination Head: Normocephalic and atraumatic, without obvious abnormality EENT: Normal conjunctivae, dry mucous membranes, no OP obstruction LUNGS: Normal respiratory effort. Non-labored breathing on room air CV: Regular rate and rhythm on telemetry ABDOMEN: Soft, non-tender, non-distended Extremities: warm, well perfused, without obvious deformity  NEURO:  Mental Status:  Drowsy but awakens immediately to voice, oriented to self and place. States it is June of 2013 when asked. He is able to tell me that he fell twice before coming to the hospital and that he probably had a seizure. Speech/Language: speech is clear.   No aphasia, but slow to answer complex questions. No neglect is noted Cranial Nerves:  II: PERRL 3 mm/brisk. visual fields full.  III, IV, VI: EOMI. Lid elevation symmetric and full.  V: Sensation is intact to light touch and symmetrical to face. Blinks to threat. Moves jaw back and forth.  VII: Face is symmetric resting  and smiling. Able to puff cheeks and raise eyebrows.  VIII: Hearing intact to voice IX, X: Palate elevation is symmetric. Phonation normal.  XI: Normal sternocleidomastoid and trapezius muscle strength XII: Tongue protrudes midline without fasciculations.   Motor: 5/5 strength is all muscle groups.  Tone is normal. Bulk is normal.  Sensation: Intact to light touch bilaterally in all four extremities. No extinction to DSS present.  Coordination: FTN intact  bilaterally. HKS intact bilaterally. No pronator drift. Alternating hand movements.  DTRs: 2+ throughout.  Gait: Deferred   Labs I have reviewed labs in epic and the results pertinent to this consultation are:  CBC    Component Value Date/Time   WBC 11.2 (H) 11/03/2021 0542   RBC 3.40 (L) 11/03/2021 0542   HGB 10.7 (L) 11/03/2021 0542   HCT 31.9 (L) 11/03/2021 0542   PLT 221 11/03/2021 0542   MCV 93.8 11/03/2021 0542   MCH 31.5 11/03/2021 0542   MCHC 33.5 11/03/2021 0542   RDW 14.8 11/03/2021 0542   LYMPHSABS 1.8 11/03/2021 0542   MONOABS 1.1 (H) 11/03/2021 0542   EOSABS 0.0 11/03/2021 0542   BASOSABS 0.0 11/03/2021 0542    CMP     Component Value Date/Time   NA 144 11/03/2021 0542   K 3.7 11/03/2021 0542   CL 111 11/03/2021 0542   CO2 21 (L) 11/03/2021 0542   GLUCOSE 98 11/03/2021 0542   BUN 17 11/03/2021 0542   CREATININE 0.91 11/03/2021 0542   CALCIUM 8.6 (L) 11/03/2021 0542   PROT 6.5 11/03/2021 0542   ALBUMIN 2.9 (L) 11/03/2021 0542   AST 53 (H) 11/03/2021 0542   ALT 37 11/03/2021 0542   ALKPHOS 106 11/03/2021 0542   BILITOT 3.1 (H) 11/03/2021 0542   GFRNONAA >60 11/03/2021 0542    Lipid Panel  No results found for: "CHOL", "TRIG", "HDL", "CHOLHDL", "VLDL", "LDLCALC", "LDLDIRECT"   Imaging I have reviewed the images obtained:  CT-scan of the brain- No acute intracranial pathology  Assessment: 76 y.o. male with medical history significant for bipolar disorder, seizure disorder, traumatic subarachnoid hemorrhage, PTSD, chronic anxiety/depression, tobacco use disorder, hyperlipidemia, obstructive sleep apnea, who presented to [facility] ED from independent living facility. Epileptiform discharges noted in EEG, mental status and speech improved with ativan after status was broken. Patient is now Aox2-3 and is conversational.   Impression: Status epilepticus due to noncompliance with medication  Recommendations: - Increase depakote to 1000mg  daily and bolus with  450mg  IV, unable to increase keppra due to kidney function - Continue LTM EEG  Patient seen and examined by NP/APP with MD. MD to update note as needed.   , DNP, FNP-BC [facility] Pager: [phone]

## 2021-11-03 NOTE — Progress Notes (Signed)
OT Cancellation Note  Patient Details Name: Kerry [name] MRN: [id number] DOB: 1946-04-04   Cancelled Treatment:    Reason Eval/Treat Not Completed: Patient at procedure or test/ unavailable- pt getting setup on long term eeg. Will follow and see as able.   Barry Brunner, OT Acute Rehabilitation Services Office [phone]   Chancy Milroy 11/03/2021, 10:19 AM

## 2021-11-03 NOTE — Progress Notes (Signed)
Pt is [facility] with [facility]: PCP: Dr Camillo Flaming SWDimas Aguas [phone] ext [phone] Pt is 100% service connected

## 2021-11-04 DIAGNOSIS — Z91148 Patient's other noncompliance with medication regimen for other reason: Secondary | ICD-10-CM | POA: Diagnosis not present

## 2021-11-04 DIAGNOSIS — R410 Disorientation, unspecified: Secondary | ICD-10-CM | POA: Diagnosis not present

## 2021-11-04 DIAGNOSIS — G40901 Epilepsy, unspecified, not intractable, with status epilepticus: Secondary | ICD-10-CM | POA: Diagnosis not present

## 2021-11-04 DIAGNOSIS — R4182 Altered mental status, unspecified: Secondary | ICD-10-CM | POA: Diagnosis not present

## 2021-11-04 DIAGNOSIS — G40919 Epilepsy, unspecified, intractable, without status epilepticus: Secondary | ICD-10-CM | POA: Diagnosis not present

## 2021-11-04 DIAGNOSIS — R569 Unspecified convulsions: Secondary | ICD-10-CM | POA: Diagnosis not present

## 2021-11-04 LAB — BASIC METABOLIC PANEL
Anion gap: 11 (ref 5–15)
BUN: 13 mg/dL (ref 8–23)
CO2: 25 mmol/L (ref 22–32)
Calcium: 8.5 mg/dL — ABNORMAL LOW (ref 8.9–10.3)
Chloride: 103 mmol/L (ref 98–111)
Creatinine, Ser: 0.71 mg/dL (ref 0.61–1.24)
GFR, Estimated: >60 mL/min (ref >60)
Glucose, Bld: 87 mg/dL (ref 70–99)
Potassium: 3.3 mmol/L — ABNORMAL LOW (ref 3.5–5.1)
Sodium: 139 mmol/L (ref 135–145)

## 2021-11-04 LAB — CBC
HCT: 32.9 % — ABNORMAL LOW (ref 39.0–52.0)
Hemoglobin: 10.8 g/dL — ABNORMAL LOW (ref 13.0–17.0)
MCH: 30.8 pg (ref 26.0–34.0)
MCHC: 32.8 g/dL (ref 30.0–36.0)
MCV: 93.7 fL (ref 80.0–100.0)
Platelets: 174 10*3/uL (ref 150–400)
RBC: 3.51 MIL/uL — ABNORMAL LOW (ref 4.22–5.81)
RDW: 14.6 % (ref 11.5–15.5)
WBC: 8 10*3/uL (ref 4.0–10.5)
nRBC: 0 % (ref 0.0–0.2)

## 2021-11-04 LAB — CK: Total CK: 490 U/L — ABNORMAL HIGH (ref 49–397)

## 2021-11-04 LAB — VALPROIC ACID LEVEL: Valproic Acid Lvl: 69 ug/mL (ref 50.0–100.0)

## 2021-11-04 MED ORDER — POTASSIUM CHLORIDE CRYS ER 20 MEQ PO TBCR: 40.0000 meq | EXTENDED_RELEASE_TABLET | Freq: Once | ORAL | Status: DC

## 2021-11-04 MED ORDER — POTASSIUM CHLORIDE CRYS ER 20 MEQ PO TBCR
20.0000 meq | EXTENDED_RELEASE_TABLET | Freq: Once | ORAL | Status: AC
Start: 2021-11-04 — End: 2021-11-04
  Administered 2021-11-04: 20 meq via ORAL
  Filled 2021-11-04: qty 1

## 2021-11-04 MED ORDER — K PHOS MONO-SOD PHOS DI & MONO 155-852-130 MG PO TABS
250.0000 mg | ORAL_TABLET | Freq: Two times a day (BID) | ORAL | Status: AC
  Administered 2021-11-04 (×2): 250 mg via ORAL
  Filled 2021-11-04 (×2): qty 1

## 2021-11-04 MED ORDER — LORAZEPAM 2 MG/ML IJ SOLN
2.0000 mg | Freq: Once | INTRAMUSCULAR | Status: AC
  Administered 2021-11-04: 2 mg via INTRAVENOUS
  Filled 2021-11-04: qty 1

## 2021-11-04 MED ORDER — LEVETIRACETAM 500 MG PO TABS
1000.0000 mg | ORAL_TABLET | Freq: Two times a day (BID) | ORAL | Status: DC
  Administered 2021-11-04 (×2): 1000 mg via ORAL
  Filled 2021-11-04 (×2): qty 2

## 2021-11-04 MED ORDER — LEVETIRACETAM ER 500 MG PO TB24
1500.0000 mg | ORAL_TABLET | Freq: Every day | ORAL | Status: DC
Start: 2021-11-04 — End: 2021-11-05
  Administered 2021-11-04: 500 mg via ORAL
  Administered 2021-11-05: 1500 mg via ORAL
  Filled 2021-11-04 (×2): qty 3

## 2021-11-04 NOTE — Progress Notes (Signed)
TRH floor coverage for both [facility] and [facility] (remote) on night of 11/03/21 into morning of 11/04/21:    I was notified by RN with update that the patient had passed his swallow evaluation, and requested clarification on diet ordered.  Per my chart review, including review of most recent rounding hospitalist progress note, it appears that the patient is not tended to be n.p.o. until midnight heading into 11/06/2021.  Given the proposed timeframe for initiation of n.p.o. status, I have subsequently initiated a regular diet in the interval.      Newton Pigg, DO Hospitalist

## 2021-11-04 NOTE — Progress Notes (Signed)
SLP Cancellation Note  Patient Details Name: Marcelino Campos MRN: [id number] DOB: 08-18-1945   Cancelled treatment:         Passed [facility] swallow screen, therefore,  per protocol, no SLP swallow evaluation is warranted.  Dewarren Ledbetter L. Samson Frederic, MA CCC/SLP Clinical Specialist - Acute Care SLP Acute Rehabilitation Services Office number [phone]   Blenda Mounts Laurice 11/04/2021, 1:50 PM

## 2021-11-04 NOTE — Procedures (Addendum)
Patient Name: Kerry [name]  MRN: [id number]  Epilepsy Attending: Charlsie Quest  Referring Physician/Provider: Charlsie Quest, MD Duration: 11/03/2021 1112 to 11/04/2021 1112   Patient history: 76 year old male with history of seizures on Keppra and Depakote presented with altered mental status.  EEG to evaluate for seizure.   Level of alertness: awake, asleep   AEDs during EEG study: LEV, VPA   Technical aspects: This EEG study was done with scalp electrodes positioned according to the 10-20 International system of electrode placement. Electrical activity was acquired at a sampling rate of 500Hz  and reviewed with a high frequency filter of 70Hz  and a low frequency filter of 1Hz . EEG data were recorded continuously and digitally stored.    Description: No clear posterior dominant rhythm was seen. Sleep was characterized by sleep spindles (12-14hz ), asymmetric, maximal right frontocentral region.  EEG showed continuous generalized and lateralized left hemisphere 3 to 7 Hz theta and delta slowing. At the beginning of study, EEG showed lateralized periodic discharges (LPD) were noted in left hemisphere, maximal left posterior quadrant at 1-1.5hz  which gradually improved and showed spikes in left hemisphere. Hyperventilation and photic stimulation were not performed.     Event button was pressed on 11/03/2021 at 1446 for eye flickering per bedside RN ( difficult to visualize on video). Concomitant eeg before, during and after the event didn't show any eeg change to suggest seizure.  Event button was pressed on 11/04/2021 at 0906 for decreased responsiveness , head and hand tremors per bedside RN ( difficult to visualize on video). Concomitant eeg before, during and after the event didn't show any eeg change to suggest seizure.   ABNORMALITY -Lateralized periodic discharges,left hemisphere, maximal left posterior quadrant -Continuous slow, generalized and lateralized left hemisphere    IMPRESSION: This study initially showed evidence of epileptogenicity arising from left posterior quadrant which is on the ictal-interictal continuum with high potential for seizures.  Gradually the eeg improved and was suggestive of cortical dysfunction in left hemisphere likely secondary to underlying structural abnormality, postictal state. Lastly there is also moderate diffuse encephalopathy, nonspecific etiology but most likely related to seizures.  Event button was pressed on 11/03/2021 at 1446 for eye flickering without concomitant eeg change. However, focal motor seizures may not be seen on scalp eeg. Clinical correlation is recommended.  Event button was pressed on 11/04/2021 at 0906 for decreased responsiveness , head and hand tremors per bedside RN ( difficult to visualize on video) without concomitant eeg change. However, focal seizures may not be seen on scalp eeg. Clinical correlation is recommended.  EEG appears improved compared to previous day.    [name] 11/06/2021

## 2021-11-04 NOTE — Progress Notes (Addendum)
Progress NoteThong [name]    SWF:[id number] DOB: 02-Jan-1946 DOA: 11/02/2021  PCP: [facility]    Brief Narrative:    76 y.o. male with medical history significant for bipolar disorder, seizure disorder, traumatic subarachnoid hemorrhage, PTSD, chronic anxiety/depression, tobacco use disorder, hyperlipidemia, obstructive sleep apnea, who presented to [facility] ED from independent living facility after being found in his apartment covered with urine and feces.  Last seen 2 days ago, was able to take care of his own ADLs.  He was brought into the ED for further evaluation.   Subjective:   No acute events overnight.  Remains on continuous EEG monitoring.  Noted to have some left eye twitching and more difficulty expressing himself today.  We will give a one-time dose of Ativan  Assessment and Plan:    Postictal encephalopathy: Resolved  Status epilepticus: Confirmed on EEG.  Undergoing continuous EEG monitoring.  Neurology following in consultation.  Repeat 1 dose of Ativan IV.  Secondary to medication noncompliance due to forgetfulness.  AED management per neurology. Seizure precautions.  Seizure disorder: Plan as above  Bipolar disorder/chronic anxiety/depression:  Continue home Lexapro.  Mild rhabdo: Secondary to status epilepticus.  Resolved.  Discontinue IV fluids  Elevated high-sensitivity troponins: Mild.  No chest pain.  Likely secondary to demand ischemia.  Generalized weakness: PT/OT and fall precautions  Obstructive jaundice: Status post recent EUS/ERCP with only atypical cells found. Dr. Francee Gentile had put him in for an urgent follow-up EUS this Monday at [facility] due to the nondiagnostic pathology to try and get more tissue/establish a diagnosis. Now planning to do on Monday inpatient if medically cleared. NPO Sun night. Otherwise will be done outpatient in July.   Other information:    DVT prophylaxis: Lovenox subcu Code Status: Full  code Family Communication: No family at bedside Disposition:   Status is: Inpatient Remains inpatient appropriate because: Status epilepticus resolved but awaiting neurology clearance    Consultants:   Neurology    Objective:    Vitals:   11/03/21 2336 11/04/21 0352 11/04/21 0830 11/04/21 1158  BP: 138/64 (!) 137/32 135/76 (!) 114/57  Pulse: (!) 59 65 67 (!) 53  Resp: 17 17 16 15   Temp: 98.2 F (36.8 C) 97.9 F (36.6 C) 97.9 F (36.6 C) 97.6 F (36.4 C)  TempSrc: Oral Oral Oral Oral  SpO2: 98% 100% 99% 98%    Intake/Output Summary (Last 24 hours) at 11/04/2021 1509 Last data filed at 11/04/2021 0600 Gross per 24 hour  Intake 1470 ml  Output 850 ml  Net 620 ml      Physical Exam:    General exam: Appears calm and comfortable, chornically ill-appearing, thin and frail, EEG leads on scalp Respiratory system: Clear to auscultation. Respiratory effort normal. Cardiovascular system: S1 & S2 heard, RRR. No JVD, murmurs, rubs, gallops or clicks. No pedal edema. Gastrointestinal system: Abdomen is nondistended, soft and nontender. No organomegaly or masses felt. Normal bowel sounds heard. Central nervous system: Alert and oriented x2 to name and place. No focal neurological deficits. Left eye twitching. Extremities: Symmetric 5 x 5 power. Skin: No rashes, lesions or ulcers Psychiatry: Judgement and insight appear normal. Mood & affect appropriate.     Data Reviewed:    I have personally reviewed following labs and imaging studies  CBC: Recent Labs  Lab 10/30/21 0945 11/02/21 2041 11/02/21 2106 11/03/21 0008 11/03/21 0542 11/04/21 0246  WBC 9.6 13.7*  --   --  11.2*  8.0  NEUTROABS 6.2 11.1*  --   --  8.2*  --   HGB 11.2* 11.3* 11.6*  11.6* 10.9* 10.7* 10.8*  HCT 34.3* 33.8* 34.0*  34.0* 32.0* 31.9* 32.9*  MCV 95.5 93.9  --   --  93.8 93.7  PLT 279 209  --   --  221 174     Basic Metabolic Panel: Recent Labs  Lab 10/30/21 0945 11/02/21 2041  11/02/21 2106 11/03/21 0008 11/03/21 0542 11/04/21 0246  NA 142 144 145  145 146* 144 139  K 3.6 3.4* 3.4*  3.4* 3.3* 3.7 3.3*  CL 108 108 110  --  111 103  CO2 25 22  --   --  21* 25  GLUCOSE 110* 116* 112*  --  98 87  BUN 13 21 22   --  17 13  CREATININE 0.76 1.07 0.80  --  0.91 0.71  CALCIUM 9.1 9.2  --   --  8.6* 8.5*  MG  --   --   --   --  1.9  --   PHOS  --   --   --   --  2.3*  --      GFR: CrCl cannot be calculated (Unknown ideal weight.).  Liver Function Tests: Recent Labs  Lab 10/30/21 0945 11/02/21 2041 11/03/21 0542  AST 50* 47* 53*  ALT 51* 37 37  ALKPHOS 146* 117 106  BILITOT 3.4* 3.4* 3.1*  PROT 7.2 6.9 6.5  ALBUMIN 3.5 3.3* 2.9*     CBG: Recent Labs  Lab 11/02/21 2040  GLUCAP 119*      Recent Results (from the past 240 hour(s))  Urine Culture     Status: Abnormal   Collection Time: 11/02/21  8:29 PM   Specimen: Urine, Clean Catch  Result Value Ref Range Status   Specimen Description URINE, CLEAN CATCH  Final   Special Requests   Final    NONE Performed at [facility], [address] [place], [place]    Culture MULTIPLE SPECIES PRESENT, SUGGEST RECOLLECTION (A)  Final   Report Status 11/03/2021 FINAL  Final  Culture, blood (Routine X 2) w Reflex to ID Panel     Status: None (Preliminary result)   Collection Time: 11/02/21 11:52 PM   Specimen: BLOOD  Result Value Ref Range Status   Specimen Description BLOOD LEFT ANTECUBITAL  Final   Special Requests   Final    BOTTLES DRAWN AEROBIC AND ANAEROBIC Blood Culture adequate volume   Culture   Final    NO GROWTH 1 DAY Performed at [facility], [address] [place], [place]    Report Status PENDING  Incomplete  Culture, blood (Routine X 2) w Reflex to ID Panel     Status: None (Preliminary result)   Collection Time: 11/03/21 12:15 AM   Specimen: BLOOD RIGHT HAND  Result Value Ref Range Status   Specimen Description BLOOD RIGHT HAND  Final    Special Requests   Final    BOTTLES DRAWN AEROBIC AND ANAEROBIC Blood Culture results may not be optimal due to an excessive volume of blood received in culture bottles   Culture   Final    NO GROWTH 1 DAY Performed at [facility], [address] [place], [place]    Report Status PENDING  Incomplete         Radiology Studies:    Overnight EEG with video  Result Date:  11/04/2021 Charlsie Quest, MD     11/04/2021 10:07 AM Patient Name: Kieffer Blatz MRN: [id number] Epilepsy Attending: Charlsie Quest Referring Physician/Provider: Charlsie Quest, MD Duration: 11/03/2021 1112 to 11/04/2021 1000  Patient history: 76 year old male with history of seizures on Keppra and Depakote presented with altered mental status.  EEG to evaluate for seizure.  Level of alertness: awake, asleep  AEDs during EEG study: LEV, VPA  Technical aspects: This EEG study was done with scalp electrodes positioned according to the 10-20 International system of electrode placement. Electrical activity was acquired at a sampling rate of 500Hz  and reviewed with a high frequency filter of 70Hz  and a low frequency filter of 1Hz . EEG data were recorded continuously and digitally stored.  Description: No clear posterior dominant rhythm was seen. Sleep was characterized by sleep spindles (12-14hz ), asymmetric, maximal right frontocentral region.  EEG showed continuous generalized and lateralized left hemisphere 3 to 7 Hz theta and delta slowing. At the beginning of study, EEG showed lateralized periodic discharges (LPD) were noted in left hemisphere, maximal left posterior quadrant at 1-1.5hz  which gradually improved and showed spikes in left hemisphere. Hyperventilation and photic stimulation were not performed.   Event button was pressed on 11/03/2021 at 1446 for eye flickering per bedside RN ( difficult to visualize on video). Concomitant eeg before, during and after the event didn't show any eeg change to suggest  seizure. Event button was pressed on 11/04/2021 at 0906 for decreased responsiveness , head and hand tremors per bedside RN ( difficult to visualize on video). Concomitant eeg before, during and after the event didn't show any eeg change to suggest seizure. ABNORMALITY -Lateralized periodic discharges,left hemisphere, maximal left posterior quadrant -Continuous slow, generalized and lateralized left hemisphere  IMPRESSION: This study initially showed evidence of epileptogenicity arising from left posterior quadrant which is on the ictal-interictal continuum with high potential for seizures.  Gradually the eeg improved and was suggestive of cortical dysfunction in left hemisphere likely secondary to underlying structural abnormality, postictal state. Lastly there is also moderate diffuse encephalopathy, nonspecific etiology but most likely related to seizures. Event button was pressed on 11/03/2021 at 1446 for eye flickering without concomitant eeg change. However, focal motor seizures may not be seen on scalp eeg. Clinical correlation is recommended. Event button was pressed on 11/04/2021 at 0906 for decreased responsiveness , head and hand tremors per bedside RN ( difficult to visualize on video) without concomitant eeg change. However, focal seizures may not be seen on scalp eeg. Clinical correlation is recommended. EEG appears improved compared to previous day.  11/06/2021   EEG adult  Result Date: 11/03/2021 11/06/2021, MD     11/03/2021 10:17 AM Patient Name: Linkoln Alkire MRN: Charlsie Quest Epilepsy Attending: 11/05/2021 Referring Physician/Provider: [name], DO Date: 11/03/2021 Duration: 22.15 mins Patient history: 76 year old male with history of seizures on Keppra and Depakote presented with altered mental status.  EEG to evaluate for seizure. Level of alertness:  lethargic AEDs during EEG study: LEV Technical aspects: This EEG study was done with scalp electrodes positioned  according to the 10-20 International system of electrode placement. Electrical activity was acquired at a sampling rate of 500Hz  and reviewed with a high frequency filter of 70Hz  and a low frequency filter of 1Hz . EEG data were recorded continuously and digitally stored. Description: No clear posterior dominant rhythm was seen.  EEG showed continuous generalized and lateralized left hemisphere 3 to 7 Hz theta and delta slowing.  Frequent spikes were noted in left posterior quadrant which at times appear rhythmic without definite evolution lasting 2 to 7 seconds consistent with brief ictal-interictal rhythmic discharges.  Hyperventilation and photic stimulation were not performed.   ABNORMALITY -Brief ictal-interictal rhythmic discharges, left posterior quadrant -Continuous slow, generalized and lateralized left hemisphere IMPRESSION: This study showed evidence of epileptogenicity arising from left posterior quadrant which is on the ictal-interictal continuum with high potential for seizures.  Additionally there is cortical dysfunction in left hemisphere likely secondary to underlying structural abnormality, postictal state.  Lastly there is also moderate diffuse encephalopathy, nonspecific etiology but most likely related to seizures. Recommend long-term EEG monitoring for further evaluation of this EEG pattern. Dr. Thomasena Edis was notified. Charlsie Quest   DG Chest Port 1 View  Result Date: 11/02/2021 CLINICAL DATA:  Unresponsive EXAM: PORTABLE CHEST 1 VIEW COMPARISON:  10/20/2021 FINDINGS: Lung volumes are small but are stable since prior examination. Mild right basilar atelectasis. No confluent pulmonary infiltrate. No pneumothorax or pleural effusion. Cardiac size within normal limits. No acute bone abnormality IMPRESSION: Pulmonary hypoinflation. Electronically Signed   By: Helyn Numbers M.D.   On: 11/02/2021 23:06   CT HEAD WO CONTRAST  Result Date: 11/02/2021 CLINICAL DATA:  Trauma. EXAM: CT HEAD  WITHOUT CONTRAST CT MAXILLOFACIAL WITHOUT CONTRAST CT CERVICAL SPINE WITHOUT CONTRAST TECHNIQUE: Multidetector CT imaging of the head, cervical spine, and maxillofacial structures were performed using the standard protocol without intravenous contrast. Multiplanar CT image reconstructions of the cervical spine and maxillofacial structures were also generated. RADIATION DOSE REDUCTION: This exam was performed according to the departmental dose-optimization program which includes automated exposure control, adjustment of the mA and/or kV according to patient size and/or use of iterative reconstruction technique. COMPARISON:  Head CT dated 10/30/2021. FINDINGS: CT HEAD FINDINGS Brain: Mild age-related atrophy and chronic microvascular ischemic changes. There is no acute intracranial hemorrhage. No mass effect or midline shift no extra-axial fluid collection. Vascular: No hyperdense vessel or unexpected calcification. Skull: Normal. Negative for fracture or focal lesion. Other: None. CT MAXILLOFACIAL FINDINGS Osseous: No acute fracture or dislocation. Orbits: The globes and retro-orbital fat are preserved. Sinuses: Clear. Soft tissues: Negative. CT CERVICAL SPINE FINDINGS Alignment: No subluxation. Skull base and vertebrae: No acute fracture. Soft tissues and spinal canal: No prevertebral fluid or swelling. No visible canal hematoma. Disc levels:  No acute findings.  Mild degenerative changes. Upper chest: Negative. Other: Bilateral carotid bulb calcified plaques. IMPRESSION: 1. No acute intracranial pathology. Mild age-related atrophy and chronic microvascular ischemic changes. 2. No acute facial bone fractures. 3. No acute cervical spine pathology. Electronically Signed   By: Elgie Collard M.D.   On: 11/02/2021 22:59   CT CERVICAL SPINE WO CONTRAST  Result Date: 11/02/2021 CLINICAL DATA:  Trauma. EXAM: CT HEAD WITHOUT CONTRAST CT MAXILLOFACIAL WITHOUT CONTRAST CT CERVICAL SPINE WITHOUT CONTRAST TECHNIQUE:  Multidetector CT imaging of the head, cervical spine, and maxillofacial structures were performed using the standard protocol without intravenous contrast. Multiplanar CT image reconstructions of the cervical spine and maxillofacial structures were also generated. RADIATION DOSE REDUCTION: This exam was performed according to the departmental dose-optimization program which includes automated exposure control, adjustment of the mA and/or kV according to patient size and/or use of iterative reconstruction technique. COMPARISON:  Head CT dated 10/30/2021. FINDINGS: CT HEAD FINDINGS Brain: Mild age-related atrophy and chronic microvascular ischemic changes. There is no acute intracranial hemorrhage. No mass effect or midline shift no extra-axial fluid collection. Vascular: No hyperdense vessel or unexpected calcification. Skull:  Normal. Negative for fracture or focal lesion. Other: None. CT MAXILLOFACIAL FINDINGS Osseous: No acute fracture or dislocation. Orbits: The globes and retro-orbital fat are preserved. Sinuses: Clear. Soft tissues: Negative. CT CERVICAL SPINE FINDINGS Alignment: No subluxation. Skull base and vertebrae: No acute fracture. Soft tissues and spinal canal: No prevertebral fluid or swelling. No visible canal hematoma. Disc levels:  No acute findings.  Mild degenerative changes. Upper chest: Negative. Other: Bilateral carotid bulb calcified plaques. IMPRESSION: 1. No acute intracranial pathology. Mild age-related atrophy and chronic microvascular ischemic changes. 2. No acute facial bone fractures. 3. No acute cervical spine pathology. Electronically Signed   By: Elgie CollardArash  Radparvar M.D.   On: 11/02/2021 22:59   CT Maxillofacial Wo Contrast  Result Date: 11/02/2021 CLINICAL DATA:  Trauma. EXAM: CT HEAD WITHOUT CONTRAST CT MAXILLOFACIAL WITHOUT CONTRAST CT CERVICAL SPINE WITHOUT CONTRAST TECHNIQUE: Multidetector CT imaging of the head, cervical spine, and maxillofacial structures were performed using  the standard protocol without intravenous contrast. Multiplanar CT image reconstructions of the cervical spine and maxillofacial structures were also generated. RADIATION DOSE REDUCTION: This exam was performed according to the departmental dose-optimization program which includes automated exposure control, adjustment of the mA and/or kV according to patient size and/or use of iterative reconstruction technique. COMPARISON:  Head CT dated 10/30/2021. FINDINGS: CT HEAD FINDINGS Brain: Mild age-related atrophy and chronic microvascular ischemic changes. There is no acute intracranial hemorrhage. No mass effect or midline shift no extra-axial fluid collection. Vascular: No hyperdense vessel or unexpected calcification. Skull: Normal. Negative for fracture or focal lesion. Other: None. CT MAXILLOFACIAL FINDINGS Osseous: No acute fracture or dislocation. Orbits: The globes and retro-orbital fat are preserved. Sinuses: Clear. Soft tissues: Negative. CT CERVICAL SPINE FINDINGS Alignment: No subluxation. Skull base and vertebrae: No acute fracture. Soft tissues and spinal canal: No prevertebral fluid or swelling. No visible canal hematoma. Disc levels:  No acute findings.  Mild degenerative changes. Upper chest: Negative. Other: Bilateral carotid bulb calcified plaques. IMPRESSION: 1. No acute intracranial pathology. Mild age-related atrophy and chronic microvascular ischemic changes. 2. No acute facial bone fractures. 3. No acute cervical spine pathology. Electronically Signed   By: Elgie CollardArash  Radparvar M.D.   On: 11/02/2021 22:59        Medications:    Scheduled Meds:  aspirin EC  81 mg Oral Daily   divalproex  1,000 mg Oral QHS   enoxaparin (LOVENOX) injection  40 mg Subcutaneous Q24H   escitalopram  5 mg Oral Daily   levETIRAcetam  1,500 mg Oral Daily   levETIRAcetam  1,000 mg Oral BID   phosphorus  250 mg Oral BID   Continuous Infusions:       LOS: 2 days    Time spent: 55 minutes    Verdia KubaShayan  S Khala Tarte, MD [facility]   To contact the attending provider between 7A-7P or the covering provider during after hours 7P-7A, please log into the web site [email or web address] and access using universal [place] password for that web site. If you do not have the password, please call the hospital operator.  11/04/2021, 3:09 PM

## 2021-11-04 NOTE — Progress Notes (Signed)
OT Cancellation Note  Patient Details Name: Mallory Enriques MRN: [id number] DOB: 23-Jun-1945   Cancelled Treatment:    Reason Eval/Treat Not Completed: Other (comment) (RN reporting pt with active seizures this AM, receieved Ativan, requesting therapy follow up later in the afternoon. Will  follow up as schedule allows.)  Alfonzo Beers, OTD, OTR/L Acute Rehab [phone]   Mayer Masker 11/04/2021, 10:48 AM

## 2021-11-04 NOTE — Progress Notes (Addendum)
Neurology Progress Note Kerry [name] MR# [id number] 11/04/2021  S: No overnight events; no new complaints.   O: Current vital signs: BP 135/76 (BP Location: Right Arm)   Pulse 67   Temp 97.9 F (36.6 C) (Oral)   Resp 16   SpO2 99%  Vital signs in last 24 hours: Temp:  [97.7 F (36.5 C)-98.2 F (36.8 C)] 97.9 F (36.6 C) (06/17 0830) Pulse Rate:  [59-77] 67 (06/17 0830) Resp:  [16-20] 16 (06/17 0830) BP: (115-142)/(32-83) 135/76 (06/17 0830) SpO2:  [95 %-100 %] 99 % (06/17 0830) GENERAL: Awake, alert in NAD HEENT: Normocephalic and atraumatic, moist mm, no LN++, no thyromegaly LUNGS: symmetric excursions bilaterally with no audible wheezes. CV: RR, equal pulses bilaterally. ABDOMEN: Soft, nontender, nondistended with normoactive BS Ext: warm, well perfused, intact peripheral pulses  NEURO:  Mental Status: AA&Ox3 mildly confused with no recollection of event. Language: Fluency intact; intact repetition and comprehension. PERR. EOMI, visual fields full, no facial asymmetry, facial sensation intact, hearing intact. No evidence of tongue atrophy or fibrillations, tongue/uvula/soft palate midline elevates symmetrically  Normal sternocleidomastoid and trapezius muscle strength. Motor: good strength in all extremities. Tone: Tone is normal and bulk decreased. Sensation: Intact to light touch bilaterally Coordination: FTN intact bilaterally, no ataxia in BLE. Gait - Deferred  He continues to have involuntary left eye twitching.  Labs Na 139   24 hour EEG showed Lateralized periodic discharges,left hemisphere, maximal left posterior quadrant. Continuous slow, generalized and lateralized left hemisphere  Assessment: Kerry [name] is a 76 y.o. male PMHx bipolar disorder, seizure disorder, traumatic subarachnoid hemorrhage, PTSD, chronic anxiety/depression, tobacco use disorder, hyperlipidemia, obstructive sleep apnea, who presented to [facility] ED from independent living facility.  Epileptiform discharges noted in EEG, mental status and speech improved with ativan after status was broken. Patient is now Aox3 and is conversational. He   He has epilepsy often forgets to take his medications and we will increase levetiracetam which because levels will not fall as rapidly if he misses a dose in his setting of mild renal impairment  Recommendations: Continue valproic acid ER maintenance dose 1,000mg  daily. Increase levetiracetam ER to 1,500mg  as he presented in prolonged status epilepticus due to noncompliance. 2HELPS2B score 2 and we will monitor cEEG for  additional 24 hours then discontinue.   Electronically signed by:  Marisue Humble, MD Page: [phone] 11/04/2021, 9:04 AM  If 7pm- 7am, please page neurology on call as listed in AMION.

## 2021-11-04 NOTE — Evaluation (Addendum)
Physical Therapy Evaluation Patient Details Name: Kerry [name] MRN: [id number] DOB: Mar 09, 1946 Today's Date: 11/04/2021  History of Present Illness  Pt is a 75y/o male admitted secondary to AMS with ?seizures. Head CT was negative for any acute findings. PMH including but not limited to Bipolar, PTSD.  Clinical Impression  Pt presented supine in bed with HOB elevated, awake and willing to participate in therapy session. Pt with continuous EEG leads/monitor connected as well. Pt very confused throughout and no family/caregivers present to provide any reliable information or confirm pt's reports of PLOF. Pt stated that he lives at an ALF and was previously independent with all functional mobility and ADLs. At the time of evaluation, pt able to complete bed mobility with supervision, transfers with min A and take several side steps at EOB with min A. Pt would continue to benefit from skilled physical therapy services at this time while admitted and after d/c to address the below listed limitations in order to improve overall safety and independence with functional mobility.      Recommendations for follow up therapy are one component of a multi-disciplinary discharge planning process, led by the attending physician.  Recommendations may be updated based on patient status, additional functional criteria and insurance authorization.  Follow Up Recommendations Skilled nursing-short term rehab (<3 hours/day)    Assistance Recommended at Discharge Frequent or constant Supervision/Assistance  Patient can return home with the following  A lot of help with walking and/or transfers;A lot of help with bathing/dressing/bathroom;Assistance with cooking/housework    Equipment Recommendations Other (comment) (TBD pending further mobility assessment)  Recommendations for Other Services       Functional Status Assessment Patient has had a recent decline in their functional status and demonstrates the  ability to make significant improvements in function in a reasonable and predictable amount of time.     Precautions / Restrictions Precautions Precautions: Fall Restrictions Weight Bearing Restrictions: No      Mobility  Bed Mobility Overal bed mobility: Needs Assistance Bed Mobility: Supine to Sit, Sit to Supine     Supine to sit: Supervision Sit to supine: Supervision   General bed mobility comments: no physical assist needed, supervision for safety    Transfers Overall transfer level: Needs assistance Equipment used: None Transfers: Sit to/from Stand Sit to Stand: Min guard, Min assist           General transfer comment: multimodal cueing to complete task; min guard for safety to stand, min A for stability when returning to sit    Ambulation/Gait               General Gait Details: pt still attached to continuous EEG leads/monitor, therefore, limited with distance. Pt able to take 5-6 side steps at EOB towards his right side with min A from PT for stability  Stairs            Wheelchair Mobility    Modified Rankin (Stroke Patients Only) Modified Rankin (Stroke Patients Only) Pre-Morbid Rankin Score: No symptoms Modified Rankin: Slight disability     Balance Overall balance assessment: Needs assistance Sitting-balance support: Feet supported Sitting balance-Leahy Scale: Fair     Standing balance support: During functional activity, No upper extremity supported Standing balance-Leahy Scale: Poor Standing balance comment: min A needed                             Pertinent Vitals/Pain Pain Assessment Pain Assessment: Faces Pain Score: 0-No  pain    Home Living Family/patient expects to be discharged to:: Assisted living                 Home Equipment: None Additional Comments: Pt reported that he lives in an ALF [facility]), no stairs, walk-in shower with a shower seat. He also stated that he typically walks  1-2 miles on a regular basis. No family/caregivers present throughout eval to confirm this information and pt is a questionable historian secondary to confusion throughout.    Prior Function Prior Level of Function : Independent/Modified Independent                     Hand Dominance        Extremity/Trunk Assessment   Upper Extremity Assessment Upper Extremity Assessment: Defer to OT evaluation    Lower Extremity Assessment Lower Extremity Assessment: Generalized weakness       Communication   Communication: No difficulties  Cognition Arousal/Alertness: Awake/alert Behavior During Therapy: WFL for tasks assessed/performed Overall Cognitive Status: Impaired/Different from baseline Area of Impairment: Attention, Memory, Following commands, Safety/judgement, Awareness, Problem solving                   Current Attention Level: Sustained Memory: Decreased short-term memory Following Commands: Follows one step commands inconsistently, Follows one step commands with increased time Safety/Judgement: Decreased awareness of safety, Decreased awareness of deficits Awareness: Intellectual Problem Solving: Slow processing, Decreased initiation, Difficulty sequencing, Requires verbal cues, Requires tactile cues General Comments: pt very confused throughout and having word finding difficulties        General Comments      Exercises     Assessment/Plan    PT Assessment Patient needs continued PT services  PT Problem List Decreased balance;Decreased mobility;Decreased coordination;Decreased cognition;Decreased knowledge of use of DME;Decreased safety awareness;Decreased knowledge of precautions       PT Treatment Interventions DME instruction;Gait training;Stair training;Functional mobility training;Therapeutic activities;Therapeutic exercise;Balance training;Neuromuscular re-education;Cognitive remediation;Patient/family education    PT Goals (Current goals can  be found in the Care Plan section)  Acute Rehab PT Goals Patient Stated Goal: unable to state PT Goal Formulation: Patient unable to participate in goal setting Time For Goal Achievement: 11/18/21 Potential to Achieve Goals: Good    Frequency Min 2X/week     Co-evaluation               AM-PAC PT "6 Clicks" Mobility  Outcome Measure Help needed turning from your back to your side while in a flat bed without using bedrails?: None Help needed moving from lying on your back to sitting on the side of a flat bed without using bedrails?: None Help needed moving to and from a bed to a chair (including a wheelchair)?: A Little Help needed standing up from a chair using your arms (e.g., wheelchair or bedside chair)?: A Little Help needed to walk in hospital room?: A Little Help needed climbing 3-5 steps with a railing? : A Lot 6 Click Score: 19    End of Session Equipment Utilized During Treatment: Gait belt Activity Tolerance: Patient tolerated treatment well Patient left: in bed;with call bell/phone within reach;with bed alarm set Nurse Communication: Mobility status PT Visit Diagnosis: Other abnormalities of gait and mobility (R26.89)    Time: 6283-1517 PT Time Calculation (min) (ACUTE ONLY): 14 min   Charges:   PT Evaluation $PT Eval Moderate Complexity: 1 Mod          Ginette Pitman, PT, DPT  Acute Rehabilitation Services  Office [phone]   Alessandra Bevels Farhana Fellows 11/04/2021, 11:17 AM

## 2021-11-04 NOTE — Progress Notes (Addendum)
LTM maint complete - no skin breakdown  Serviced Cz P3 Ref [facility] monitored, Event button test confirmed by [facility].

## 2021-11-04 NOTE — Progress Notes (Signed)
EEG maint complete.  ?

## 2021-11-05 DIAGNOSIS — G40901 Epilepsy, unspecified, not intractable, with status epilepticus: Secondary | ICD-10-CM | POA: Diagnosis not present

## 2021-11-05 DIAGNOSIS — R410 Disorientation, unspecified: Secondary | ICD-10-CM | POA: Diagnosis not present

## 2021-11-05 DIAGNOSIS — Z91148 Patient's other noncompliance with medication regimen for other reason: Secondary | ICD-10-CM | POA: Diagnosis not present

## 2021-11-05 DIAGNOSIS — R569 Unspecified convulsions: Secondary | ICD-10-CM | POA: Diagnosis not present

## 2021-11-05 DIAGNOSIS — R4182 Altered mental status, unspecified: Secondary | ICD-10-CM | POA: Diagnosis not present

## 2021-11-05 LAB — BASIC METABOLIC PANEL
Anion gap: 9 (ref 5–15)
BUN: 8 mg/dL (ref 8–23)
CO2: 26 mmol/L (ref 22–32)
Calcium: 8.5 mg/dL — ABNORMAL LOW (ref 8.9–10.3)
Chloride: 104 mmol/L (ref 98–111)
Creatinine, Ser: 0.75 mg/dL (ref 0.61–1.24)
GFR, Estimated: >60 mL/min (ref >60)
Glucose, Bld: 103 mg/dL — ABNORMAL HIGH (ref 70–99)
Potassium: 3.2 mmol/L — ABNORMAL LOW (ref 3.5–5.1)
Sodium: 139 mmol/L (ref 135–145)

## 2021-11-05 LAB — SURGICAL PCR SCREEN
MRSA, PCR: NEGATIVE
Staphylococcus aureus: NEGATIVE

## 2021-11-05 MED ORDER — LEVETIRACETAM IN NACL 1500 MG/100ML IV SOLN
1500.0000 mg | Freq: Two times a day (BID) | INTRAVENOUS | Status: DC
  Administered 2021-11-06 – 2021-11-07 (×3): 1500 mg via INTRAVENOUS
  Filled 2021-11-05 (×3): qty 100

## 2021-11-05 MED ORDER — POTASSIUM CHLORIDE CRYS ER 20 MEQ PO TBCR
40.0000 meq | EXTENDED_RELEASE_TABLET | Freq: Two times a day (BID) | ORAL | Status: AC
Start: 2021-11-05 — End: 2021-11-05
  Administered 2021-11-05 (×2): 40 meq via ORAL
  Filled 2021-11-05 (×2): qty 2

## 2021-11-05 NOTE — Progress Notes (Signed)
CSW received consult for possible SNF placement at time of discharge. Due to patients current orientation CSW called patients Brother Kerry [name] to discuss dc plans for patient. CSW LVM. CSW awaiting callback. CSW will continue to follow and assist with patients dc planning needs.

## 2021-11-05 NOTE — Progress Notes (Addendum)
Neurology Progress Note Kerry [name] MR# [id number] 11/05/2021  S: No overnight events; no new complaints.   O: Current vital signs: BP 138/77 (BP Location: Left Arm)   Pulse 68   Temp 98.5 F (36.9 C) (Oral)   Resp 16   SpO2 97%  Vital signs in last 24 hours: Temp:  [97.5 F (36.4 C)-98.5 F (36.9 C)] 98.5 F (36.9 C) (06/18 1204) Pulse Rate:  [59-73] 68 (06/18 1204) Resp:  [15-17] 16 (06/18 1204) BP: (114-153)/(62-77) 138/77 (06/18 1204) SpO2:  [96 %-99 %] 97 % (06/18 1204) GENERAL: Awake, alert in NAD HEENT: Normocephalic and atraumatic, moist mm, no LN++, no thyromegaly LUNGS: symmetric excursions bilaterally with no audible wheezes. CV: RR, equal pulses bilaterally. ABDOMEN: Soft, nontender, nondistended with normoactive BS Ext: warm, well perfused, intact peripheral pulses  NEURO:  Mental Status: AA&Ox3 mildly confused with no recollection of event. Language: Fluency intact; intact repetition and comprehension. PERR. EOMI, visual fields full, no facial asymmetry, facial sensation intact, hearing intact. No evidence of tongue atrophy or fibrillations, tongue/uvula/soft palate midline elevates symmetrically  Normal sternocleidomastoid and trapezius muscle strength. Motor: good strength in all extremities. Tone: Tone is normal and bulk decreased. Sensation: Intact to light touch bilaterally Coordination: FTN intact bilaterally, no ataxia in BLE. Gait - Deferred  Labs Na 139   24 hour EEG 11/04/2021 1112 to 11/05/2021 0900 Spikes,left hemisphere, maximal left posterior quadrant. Continuous slow, generalized and lateralized left hemisphere. No seizure activity.  Assessment: Kerry [name] is a 76 y.o. male PMHx bipolar disorder, seizure disorder, traumatic subarachnoid hemorrhage, PTSD, chronic anxiety/depression, tobacco use disorder, hyperlipidemia, obstructive sleep apnea, who presented to [facility] ED from independent living facility. Epileptiform discharges noted in  EEG, mental status and speech improved with ativan after status was broken. Patient is now Aox3 and is conversational. He has epilepsy often forgets to take his medications and we will increase levetiracetam which because levels will not fall as rapidly if he misses a dose in his setting of mild renal impairment.  Breakthrough seizure due to noncompliance. He remains seizure free and is tolerating medications.  Recommendations: Continue valproic acid ER 1,000mg  daily. Continue levetiracetam ER to 1,500mg  as he is noncompliant. Discontinue cEEG. Follow up outpatient neurology. Neurology will remain available, please call for questions.   Electronically signed by:  Marisue Humble, MD Page: [phone] 11/05/2021, 12:14 PM  If 7pm- 7am, please page neurology on call as listed in AMION.

## 2021-11-05 NOTE — Progress Notes (Signed)
Progress NoteOluwafemi [name]    HDQ:[id number] DOB: 20-Mar-1946 DOA: 11/02/2021  PCP: [facility]    Brief Narrative:    76 y.o. male with medical history significant for bipolar disorder, seizure disorder, traumatic subarachnoid hemorrhage, PTSD, chronic anxiety/depression, tobacco use disorder, hyperlipidemia, obstructive sleep apnea, who presented to [facility] ED from independent living facility after being found in his apartment covered with urine and feces.  Last seen 2 days ago, was able to take care of his own ADLs.  He was brought into the ED for further evaluation.   Subjective:   No acute events overnight. No complaints. Doing well. cEEG monitoring planned to be discontinued this morning.  Assessment and Plan:    Postictal encephalopathy: Resolved  Status epilepticus: Confirmed on EEG.  cEEG monitoring likely will be discontinued this morning.  Neurology following in consultation. Secondary to medication noncompliance due to forgetfulness. Now on Keppra ER and Depakote ER to help with ease of dosing.  Seizure disorder: Plan as above  Bipolar disorder/chronic anxiety/depression:  Continue home Lexapro.  Mild rhabdo: Secondary to status epilepticus.  Resolved.    Elevated high-sensitivity troponins: Mild.  No chest pain.  Likely secondary to demand ischemia.  Generalized weakness: PT/OT recommend SNF. Continue fall precautions   Obstructive jaundice: Status post recent EUS/ERCP with only atypical cells found. Dr. Francee Gentile had put him in for an urgent follow-up EUS this Monday at [facility] due to the nondiagnostic pathology to try and get more tissue/establish a diagnosis. Now planning to do on Monday inpatient if anesthesia is available. NPO Sun night. Otherwise will be done outpatient in July. In the interim will switch his Keppra ER PO to IV dosing tomorrow so he does not miss any doses. Can be switched back to ER dosing after procedure or if he does  not have the procedure.   Other information:    DVT prophylaxis: Lovenox subcu Code Status: Full code Family Communication: No family at bedside Disposition:   Status is: Inpatient Remains inpatient appropriate because: Status epilepticus resolved but ERCP/EUS possibly tomorrow and TOC discussing SNF rehab with family.    Consultants:   Neurology    Objective:    Vitals:   11/04/21 2348 11/05/21 0352 11/05/21 0901 11/05/21 1204  BP: 133/62 114/66 134/74 138/77  Pulse: 68 65 73 68  Resp: 15 17 16 16   Temp: 97.7 F (36.5 C) 97.8 F (36.6 C) 97.8 F (36.6 C) 98.5 F (36.9 C)  TempSrc: Oral Oral Oral Oral  SpO2: 97% 96% 97% 97%    Intake/Output Summary (Last 24 hours) at 11/05/2021 1333 Last data filed at 11/05/2021 1206 Gross per 24 hour  Intake --  Output 1550 ml  Net -1550 ml      Physical Exam:    General exam: Appears calm and comfortable, chornically ill-appearing, thin and frail, EEG leads on scalp Respiratory system: Clear to auscultation. Respiratory effort normal. Cardiovascular system: S1 & S2 heard, RRR. No JVD, murmurs, rubs, gallops or clicks. No pedal edema. Gastrointestinal system: Abdomen is nondistended, soft and nontender. No organomegaly or masses felt. Normal bowel sounds heard. Central nervous system: Alert and oriented x2 to name and place. No focal neurological deficits.  Extremities: Symmetric 5 x 5 power. Skin: No rashes, lesions or ulcers Psychiatry: Judgement and insight appear normal. Mood & affect appropriate.     Data Reviewed:    I have personally reviewed following labs and imaging studies  CBC: Recent Labs  Lab 10/30/21 0945 11/02/21 2041 11/02/21 2106 11/03/21 0008 11/03/21 0542 11/04/21 0246  WBC 9.6 13.7*  --   --  11.2* 8.0  NEUTROABS 6.2 11.1*  --   --  8.2*  --   HGB 11.2* 11.3* 11.6*  11.6* 10.9* 10.7* 10.8*  HCT 34.3* 33.8* 34.0*  34.0* 32.0* 31.9* 32.9*  MCV 95.5 93.9  --   --  93.8 93.7  PLT 279  209  --   --  221 174     Basic Metabolic Panel: Recent Labs  Lab 10/30/21 0945 11/02/21 2041 11/02/21 2106 11/03/21 0008 11/03/21 0542 11/04/21 0246 11/05/21 0321  NA 142 144 145  145 146* 144 139 139  K 3.6 3.4* 3.4*  3.4* 3.3* 3.7 3.3* 3.2*  CL 108 108 110  --  111 103 104  CO2 25 22  --   --  21* 25 26  GLUCOSE 110* 116* 112*  --  98 87 103*  BUN 13 21 22   --  17 13 8   CREATININE 0.76 1.07 0.80  --  0.91 0.71 0.75  CALCIUM 9.1 9.2  --   --  8.6* 8.5* 8.5*  MG  --   --   --   --  1.9  --   --   PHOS  --   --   --   --  2.3*  --   --      GFR: CrCl cannot be calculated (Unknown ideal weight.).  Liver Function Tests: Recent Labs  Lab 10/30/21 0945 11/02/21 2041 11/03/21 0542  AST 50* 47* 53*  ALT 51* 37 37  ALKPHOS 146* 117 106  BILITOT 3.4* 3.4* 3.1*  PROT 7.2 6.9 6.5  ALBUMIN 3.5 3.3* 2.9*     CBG: Recent Labs  Lab 11/02/21 2040  GLUCAP 119*      Recent Results (from the past 240 hour(s))  Urine Culture     Status: Abnormal   Collection Time: 11/02/21  8:29 PM   Specimen: Urine, Clean Catch  Result Value Ref Range Status   Specimen Description URINE, CLEAN CATCH  Final   Special Requests   Final    NONE Performed at [facility], [address], [place], [address] [place]    Culture MULTIPLE SPECIES PRESENT, SUGGEST RECOLLECTION (A)  Final   Report Status 11/03/2021 FINAL  Final  Culture, blood (Routine X 2) w Reflex to ID Panel     Status: None (Preliminary result)   Collection Time: 11/02/21 11:52 PM   Specimen: BLOOD  Result Value Ref Range Status   Specimen Description BLOOD LEFT ANTECUBITAL  Final   Special Requests   Final    BOTTLES DRAWN AEROBIC AND ANAEROBIC Blood Culture adequate volume   Culture   Final    NO GROWTH 2 DAYS Performed at [facility], [address] [place], [address] [place]    Report Status PENDING  Incomplete  Culture, blood (Routine X 2) w Reflex to ID Panel     Status: None (Preliminary  result)   Collection Time: 11/03/21 12:15 AM   Specimen: BLOOD RIGHT HAND  Result Value Ref Range Status   Specimen Description BLOOD RIGHT HAND  Final   Special Requests   Final    BOTTLES DRAWN AEROBIC AND ANAEROBIC Blood Culture results may not be optimal due to an excessive volume of blood received in culture bottles   Culture   Final    NO GROWTH 2 DAYS Performed  at [facility], [address] [place], [place] [address]    Report Status PENDING  Incomplete         Radiology Studies:    Overnight EEG with video  Result Date: 11/04/2021 Charlsie Quest, MD     11/05/2021  8:54 AM Patient Name: Conal Shetley MRN: [id number] Epilepsy Attending: Charlsie Quest Referring Physician/Provider: Charlsie Quest, MD Duration: 11/03/2021 1112 to 11/04/2021 1112  Patient history: 76 year old male with history of seizures on Keppra and Depakote presented with altered mental status.  EEG to evaluate for seizure.  Level of alertness: awake, asleep  AEDs during EEG study: LEV, VPA  Technical aspects: This EEG study was done with scalp electrodes positioned according to the 10-20 International system of electrode placement. Electrical activity was acquired at a sampling rate of 500Hz  and reviewed with a high frequency filter of 70Hz  and a low frequency filter of 1Hz . EEG data were recorded continuously and digitally stored.  Description: No clear posterior dominant rhythm was seen. Sleep was characterized by sleep spindles (12-14hz ), asymmetric, maximal right frontocentral region.  EEG showed continuous generalized and lateralized left hemisphere 3 to 7 Hz theta and delta slowing. At the beginning of study, EEG showed lateralized periodic discharges (LPD) were noted in left hemisphere, maximal left posterior quadrant at 1-1.5hz  which gradually improved and showed spikes in left hemisphere. Hyperventilation and photic stimulation were not performed.   Event button was pressed on 11/03/2021 at 1446  for eye flickering per bedside RN ( difficult to visualize on video). Concomitant eeg before, during and after the event didn't show any eeg change to suggest seizure. Event button was pressed on 11/04/2021 at 0906 for decreased responsiveness , head and hand tremors per bedside RN ( difficult to visualize on video). Concomitant eeg before, during and after the event didn't show any eeg change to suggest seizure. ABNORMALITY -Lateralized periodic discharges,left hemisphere, maximal left posterior quadrant -Continuous slow, generalized and lateralized left hemisphere  IMPRESSION: This study initially showed evidence of epileptogenicity arising from left posterior quadrant which is on the ictal-interictal continuum with high potential for seizures.  Gradually the eeg improved and was suggestive of cortical dysfunction in left hemisphere likely secondary to underlying structural abnormality, postictal state. Lastly there is also moderate diffuse encephalopathy, nonspecific etiology but most likely related to seizures. Event button was pressed on 11/03/2021 at 1446 for eye flickering without concomitant eeg change. However, focal motor seizures may not be seen on scalp eeg. Clinical correlation is recommended. Event button was pressed on 11/04/2021 at 0906 for decreased responsiveness , head and hand tremors per bedside RN ( difficult to visualize on video) without concomitant eeg change. However, focal seizures may not be seen on scalp eeg. Clinical correlation is recommended. EEG appears improved compared to previous day.  Priyanka 11/06/2021        Medications:    Scheduled Meds:  aspirin EC  81 mg Oral Daily   divalproex  1,000 mg Oral QHS   enoxaparin (LOVENOX) injection  40 mg Subcutaneous Q24H   escitalopram  5 mg Oral Daily   potassium chloride  40 mEq Oral BID   Continuous Infusions:  [START ON 11/06/2021] levETIRAcetam          LOS: 3 days    Time spent: 55 minutes    11/06/2021,  MD [facility]   To contact the attending provider between 7A-7P or the covering provider during after hours 7P-7A, please log into  the web site [email or web address] and access using universal [place] password for that web site. If you do not have the password, please call the hospital operator.  11/05/2021, 1:33 PM

## 2021-11-05 NOTE — Progress Notes (Signed)
LTM EEG discontinued - no skin breakdown at unhook.   
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
## 2021-11-05 NOTE — Plan of Care (Signed)
Patient is doing well. Alert and follows command. Can verbalize his needs and concerns. Patient needs an assistance during eating.   Problem: Clinical Measurements: Goal: Ability to maintain clinical measurements within normal limits will improve Outcome: Progressing Goal: Respiratory complications will improve Outcome: Progressing Goal: Cardiovascular complication will be avoided Outcome: Progressing   Problem: Activity: Goal: Risk for activity intolerance will decrease Outcome: Progressing   Problem: Nutrition: Goal: Adequate nutrition will be maintained Outcome: Progressing   Problem: Elimination: Goal: Will not experience complications related to urinary retention Outcome: Progressing   Problem: Safety: Goal: Ability to remain free from injury will improve Outcome: Progressing   Problem: Skin Integrity: Goal: Risk for impaired skin integrity will decrease Outcome: Progressing

## 2021-11-05 NOTE — Procedures (Addendum)
Patient Name: Kerry [name]  MRN: [id number]  Epilepsy Attending: Charlsie Quest  Referring Physician/Provider: Charlsie Quest, MD Duration: 11/04/2021 1112 to 11/05/2021 1236   Patient history: 76 year old male with history of seizures on Keppra and Depakote presented with altered mental status.  EEG to evaluate for seizure.   Level of alertness: awake, asleep   AEDs during EEG study: LEV, VPA   Technical aspects: This EEG study was done with scalp electrodes positioned according to the 10-20 International system of electrode placement. Electrical activity was acquired at a sampling rate of 500Hz  and reviewed with a high frequency filter of 70Hz  and a low frequency filter of 1Hz . EEG data were recorded continuously and digitally stored.    Description: No clear posterior dominant rhythm was seen. Sleep was characterized by sleep spindles (12-14hz ), asymmetric, maximal right frontocentral region.  EEG showed continuous generalized and lateralized left hemisphere 3 to 7 Hz theta and delta slowing. At the beginning of study, EEG showed spikes in left hemisphere,maximal left posterior quadrant which at times appear qasi-periodic at 1-1.5hz .  Hyperventilation and photic stimulation were not performed.      ABNORMALITY -Spikes,left hemisphere, maximal left posterior quadrant -Continuous slow, generalized and lateralized left hemisphere   IMPRESSION: This study showed evidence of epileptogenicity and cortical dysfunction arising from left hemisphere, maximal left posterior quadrant likely secondary to underlying structural abnormality, postictal state. There is also moderate diffuse encephalopathy, nonspecific etiology. No definite seizures were seen during the study.    Kerry [name] 

## 2021-11-06 ENCOUNTER — Encounter (HOSPITAL_COMMUNITY): Admission: RE | Payer: Self-pay | Source: Home / Self Care

## 2021-11-06 ENCOUNTER — Ambulatory Visit (HOSPITAL_COMMUNITY)
Admission: RE | Admit: 2021-11-06 | Payer: No Typology Code available for payment source | Source: Home / Self Care | Admitting: Gastroenterology

## 2021-11-06 DIAGNOSIS — R4182 Altered mental status, unspecified: Secondary | ICD-10-CM | POA: Diagnosis not present

## 2021-11-06 LAB — CBC
HCT: 34.6 % — ABNORMAL LOW (ref 39.0–52.0)
Hemoglobin: 11.3 g/dL — ABNORMAL LOW (ref 13.0–17.0)
MCH: 30.1 pg (ref 26.0–34.0)
MCHC: 32.7 g/dL (ref 30.0–36.0)
MCV: 92.3 fL (ref 80.0–100.0)
Platelets: 187 10*3/uL (ref 150–400)
RBC: 3.75 MIL/uL — ABNORMAL LOW (ref 4.22–5.81)
RDW: 13.8 % (ref 11.5–15.5)
WBC: 5.9 10*3/uL (ref 4.0–10.5)
nRBC: 0 % (ref 0.0–0.2)

## 2021-11-06 LAB — COMPREHENSIVE METABOLIC PANEL
ALT: 23 U/L (ref 0–44)
AST: 25 U/L (ref 15–41)
Albumin: 2.7 g/dL — ABNORMAL LOW (ref 3.5–5.0)
Alkaline Phosphatase: 82 U/L (ref 38–126)
Anion gap: 8 (ref 5–15)
BUN: 9 mg/dL (ref 8–23)
CO2: 25 mmol/L (ref 22–32)
Calcium: 8.7 mg/dL — ABNORMAL LOW (ref 8.9–10.3)
Chloride: 105 mmol/L (ref 98–111)
Creatinine, Ser: 0.69 mg/dL (ref 0.61–1.24)
GFR, Estimated: >60 mL/min (ref >60)
Glucose, Bld: 100 mg/dL — ABNORMAL HIGH (ref 70–99)
Potassium: 3.8 mmol/L (ref 3.5–5.1)
Sodium: 138 mmol/L (ref 135–145)
Total Bilirubin: 1.9 mg/dL — ABNORMAL HIGH (ref 0.3–1.2)
Total Protein: 6.3 g/dL — ABNORMAL LOW (ref 6.5–8.1)

## 2021-11-06 LAB — APTT: aPTT: 36 seconds (ref 24–36)

## 2021-11-06 LAB — PROTIME-INR
INR: 1.1 (ref 0.8–1.2)
Prothrombin Time: 14.5 seconds (ref 11.4–15.2)

## 2021-11-06 LAB — LEVETIRACETAM LEVEL: Levetiracetam Lvl: <2 ug/mL — ABNORMAL LOW (ref 10.0–40.0)

## 2021-11-06 SURGERY — UPPER ENDOSCOPIC ULTRASOUND (EUS) LINEAR
Anesthesia: General

## 2021-11-06 MED ORDER — NICOTINE 21 MG/24HR TD PT24
21.0000 mg | MEDICATED_PATCH | Freq: Every day | TRANSDERMAL | Status: DC
  Administered 2021-11-06 – 2021-11-14 (×9): 21 mg via TRANSDERMAL
  Filled 2021-11-06 (×9): qty 1

## 2021-11-06 MED ORDER — TRAZODONE HCL 50 MG PO TABS
75.0000 mg | ORAL_TABLET | Freq: Every evening | ORAL | Status: DC | PRN
Start: 2021-11-06 — End: 2021-11-14
  Administered 2021-11-06 – 2021-11-10 (×3): 75 mg via ORAL
  Filled 2021-11-06 (×4): qty 2

## 2021-11-06 MED ORDER — ENOXAPARIN SODIUM 40 MG/0.4ML IJ SOSY
40.0000 mg | PREFILLED_SYRINGE | INTRAMUSCULAR | Status: DC
  Administered 2021-11-08 – 2021-11-13 (×7): 40 mg via SUBCUTANEOUS
  Filled 2021-11-06 (×7): qty 0.4

## 2021-11-06 MED ORDER — LORAZEPAM 2 MG/ML IJ SOLN
1.0000 mg | Freq: Four times a day (QID) | INTRAMUSCULAR | Status: DC | PRN
  Administered 2021-11-06 – 2021-11-07 (×2): 1 mg via INTRAVENOUS
  Filled 2021-11-06 (×2): qty 1
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
## 2021-11-06 NOTE — Progress Notes (Signed)
Patient aggitated and wants to go outside to smoke a cigarette. Will not sit back in chair and refusing to get into the bed.  MD notified and received order for nicotine patch and ativan.  Both given.

## 2021-11-06 NOTE — Progress Notes (Signed)
Progress NoteCarmelo [name]    HYI:[id number] DOB: 1945/10/15 DOA: 11/02/2021  PCP: [facility], [name]    Brief Narrative:    76 y.o. male with medical history significant for bipolar disorder, seizure disorder, traumatic subarachnoid hemorrhage, PTSD, chronic anxiety/depression, tobacco use disorder, hyperlipidemia, obstructive sleep apnea, who presented to [facility] ED from independent living facility after being found in his apartment covered with urine and feces.  Last seen 2 days ago, was able to take care of his own ADLs.  He was brought into the ED for further evaluation.   Subjective:   No acute events overnight. No complaints. Tolerating diet  Assessment and Plan:    Postictal encephalopathy: Resolved  Status epilepticus: Confirmed on EEG.   Neurology following in consultation. Secondary to medication noncompliance due to forgetfulness. Now on Keppra ER and Depakote ER to help with ease of dosing. Valproic acid er 1000 mg daily and levetiracetam er 1500 mg daily  Seizure disorder: Plan as above  Bipolar disorder/chronic anxiety/depression:  Continue home Lexapro.  Mild rhabdo: Secondary to status epilepticus.  Resolved.    Elevated high-sensitivity troponins: Mild.  No chest pain.  Likely secondary to demand ischemia.  Generalized weakness: PT/OT recommend SNF. Continue fall precautions   Obstructive jaundice: Status post recent EUS/ERCP with only atypical cells found. Dr. Francee [name] had put him in for an urgent follow-up EUS this Monday at [facility] due to the nondiagnostic pathology to try and get more tissue/establish a diagnosis. Now planning to do on Monday inpatient if anesthesia is available. Spoke with GI who is still determining whether will be able to perform today. If not gi says will need to re-schedule as an outpatient. In the interim will switch his Keppra ER PO to IV dosing tomorrow so he does not miss any doses. Can be switched back to ER  dosing after procedure or if he does not have the procedure.   Other information:    DVT prophylaxis: Lovenox subcu Code Status: Full code Family Communication: No family at bedside Disposition:   Status is: Inpatient Remains inpatient appropriate because: ongoing inpatient w/u, pending snf bed    Consultants:   Neurology    Objective:    Vitals:   11/05/21 2042 11/05/21 2309 11/06/21 0327 11/06/21 0757  BP: 136/71 139/81 (!) 144/67 129/68  Pulse: 72 67 66 60  Resp: 17 19 18 19   Temp: 97.6 F (36.4 C) 98.5 F (36.9 C) 98.4 F (36.9 C) 98 F (36.7 C)  TempSrc: Oral Oral Oral Oral  SpO2: 98% 98% 97% 98%    Intake/Output Summary (Last 24 hours) at 11/06/2021 1130 Last data filed at 11/06/2021 11/08/2021 Gross per 24 hour  Intake 120 ml  Output 1065 ml  Net -945 ml     Physical Exam:    General exam: Appears calm and comfortable, chornically ill-appearing, thin and frail, EEG leads on scalp Respiratory system: Clear to auscultation. Respiratory effort normal. Cardiovascular system: S1 & S2 heard, RRR. No JVD, murmurs, rubs, gallops or clicks. No pedal edema. Gastrointestinal system: Abdomen is nondistended, soft and nontender. No organomegaly or masses felt. Normal bowel sounds heard. Central nervous system: Alert and oriented x2 to name and place. No focal neurological deficits.  Extremities: Symmetric 5 x 5 power. Skin: No rashes, lesions or ulcers Psychiatry: Judgement and insight appear normal. Mood & affect appropriate.     Data Reviewed:    I have personally reviewed following labs and imaging studies  CBC: Recent Labs  Lab 11/02/21 2041 11/02/21 2106 11/03/21 0008 11/03/21 0542 11/04/21 0246 11/06/21 0403  WBC 13.7*  --   --  11.2* 8.0 5.9  NEUTROABS 11.1*  --   --  8.2*  --   --   HGB 11.3* 11.6*  11.6* 10.9* 10.7* 10.8* 11.3*  HCT 33.8* 34.0*  34.0* 32.0* 31.9* 32.9* 34.6*  MCV 93.9  --   --  93.8 93.7 92.3  PLT 209  --   --  221 174 187     Basic Metabolic Panel: Recent Labs  Lab 11/02/21 2041 11/02/21 2106 11/03/21 0008 11/03/21 0542 11/04/21 0246 11/05/21 0321 11/06/21 0403  NA 144 145  145 146* 144 139 139 138  K 3.4* 3.4*  3.4* 3.3* 3.7 3.3* 3.2* 3.8  CL 108 110  --  111 103 104 105  CO2 22  --   --  21* 25 26 25   GLUCOSE 116* 112*  --  98 87 103* 100*  BUN 21 22  --  17 13 8 9   CREATININE 1.07 0.80  --  0.91 0.71 0.75 0.69  CALCIUM 9.2  --   --  8.6* 8.5* 8.5* 8.7*  MG  --   --   --  1.9  --   --   --   PHOS  --   --   --  2.3*  --   --   --     GFR: CrCl cannot be calculated (Unknown ideal weight.).  Liver Function Tests: Recent Labs  Lab 11/02/21 2041 11/03/21 0542 11/06/21 0403  AST 47* 53* 25  ALT 37 37 23  ALKPHOS 117 106 82  BILITOT 3.4* 3.1* 1.9*  PROT 6.9 6.5 6.3*  ALBUMIN 3.3* 2.9* 2.7*    CBG: Recent Labs  Lab 11/02/21 2040  GLUCAP 119*     Recent Results (from the past 240 hour(s))  Urine Culture     Status: Abnormal   Collection Time: 11/02/21  8:29 PM   Specimen: Urine, Clean Catch  Result Value Ref Range Status   Specimen Description URINE, CLEAN CATCH  Final   Special Requests   Final    NONE Performed at [facility], [address], [place], [address] [place]    Culture MULTIPLE SPECIES PRESENT, SUGGEST RECOLLECTION (A)  Final   Report Status 11/03/2021 FINAL  Final  Culture, blood (Routine X 2) w Reflex to ID Panel     Status: None (Preliminary result)   Collection Time: 11/02/21 11:52 PM   Specimen: BLOOD  Result Value Ref Range Status   Specimen Description BLOOD LEFT ANTECUBITAL  Final   Special Requests   Final    BOTTLES DRAWN AEROBIC AND ANAEROBIC Blood Culture adequate volume   Culture   Final    NO GROWTH 3 DAYS Performed at [facility], [address] [place], [address] [place]    Report Status PENDING  Incomplete  Culture, blood (Routine X 2) w Reflex to ID Panel     Status: None (Preliminary result)   Collection Time: 11/03/21  12:15 AM   Specimen: BLOOD RIGHT HAND  Result Value Ref Range Status   Specimen Description BLOOD RIGHT HAND  Final   Special Requests   Final    BOTTLES DRAWN AEROBIC AND ANAEROBIC Blood Culture results may not be optimal due to an excessive volume of blood received in culture bottles   Culture   Final    NO GROWTH 3  DAYS Performed at [facility], [address], [place], [place] [address]    Report Status PENDING  Incomplete  Surgical pcr screen     Status: None   Collection Time: 11/05/21  8:51 PM   Specimen: Nasal Mucosa; Nasal Swab  Result Value Ref Range Status   MRSA, PCR NEGATIVE NEGATIVE Final   Staphylococcus aureus NEGATIVE NEGATIVE Final    Comment: (NOTE) The Xpert SA Assay (FDA approved for NASAL specimens in patients 54 years of age and older), is one component of a comprehensive surveillance program. It is not intended to diagnose infection nor to guide or monitor treatment. Performed at [facility], [address] [place], [place] [address]          Radiology Studies:    No results found.      Medications:    Scheduled Meds:  aspirin EC  81 mg Oral Daily   divalproex  1,000 mg Oral QHS   enoxaparin (LOVENOX) injection  40 mg Subcutaneous Q24H   escitalopram  5 mg Oral Daily   Continuous Infusions:  levETIRAcetam 1,500 mg (11/06/21 1005)        LOS: 4 days    Time spent: 35 minutes    Silvano Bilis, MD [facility]   To contact the attending provider between 7A-7P or the covering provider during after hours 7P-7A, please log into the web site [email or web address] and access using universal [place] password for that web site. If you do not have the password, please call the hospital operator.  11/06/2021, 11:30 AM

## 2021-11-06 NOTE — Progress Notes (Signed)
Pt's EUS (upper endoscopic ultrasound) is set up w Dr Meridee Score tmrw 6/20 at 0730s.  Orders placed for NPO to allow for this.  Also holding tmrws dose of Lovenox which he last received at 0015 this AM.  Jennye Moccasin PA-C

## 2021-11-06 NOTE — TOC Initial Note (Signed)
Transition of Care [facility]) - Initial/Assessment Note    Patient Details  Name: Kerry [name] MRN: [id number] Date of Birth: 1946-01-25  Transition of Care [facility]) CM/SW Contact:    Geralynn Ochs, LCSW Phone Number: 11/06/2021, 3:39 PM  Clinical Narrative:        CSW obtained update from PT that patient still requires assistance, recommending SNF. CSW met with patient to discuss recommendation and patient in agreement. Patient with some difficulty answering questions, but mostly appropriate during conversation, and agreeable to SNF placement. CSW asked patient about Medicare card, as it is not on file, but patient said he didn't have it with him. CSW obtained patient's Medicare number to fax out with referral for SNF placement, awaiting bed offers. CSW to follow.           Expected Discharge Plan: Skilled Nursing Facility Barriers to Discharge: Continued Medical Work up   Patient Goals and CMS Choice Patient states their goals for this hospitalization and ongoing recovery are:: to be able to get back home CMS Medicare.gov Compare Post Acute Care list provided to:: Patient Choice offered to / list presented to : Patient  Expected Discharge Plan and Services Expected Discharge Plan: [place] Choice: [place] Living arrangements for the past 2 months: [place]                                      Prior Living Arrangements/Services Living arrangements for the past 2 months: [place] Lives with:: Self Patient language and need for interpreter reviewed:: No Do you feel safe going back to the place where you live?: Yes      Need for Family Participation in Patient Care: No (Comment) Care giver support system in place?: No (comment)   Criminal Activity/Legal Involvement Pertinent to Current Situation/Hospitalization: No - Comment as needed  Activities of Daily Living       Permission Sought/Granted Permission sought to share information with : Facility Art therapist granted to share information with : Yes, Verbal Permission Granted     Permission granted to share info w AGENCY: SNF        Emotional Assessment Appearance:: Appears stated age Attitude/Demeanor/Rapport: Engaged Affect (typically observed): Appropriate Orientation: : Oriented to Self, Oriented to Place, Oriented to  Time Alcohol / Substance Use: Not Applicable Psych Involvement: No (comment)  Admission diagnosis:  Disorientation [R41.0] Non-traumatic rhabdomyolysis [M62.82] AMS (altered mental status) [R41.82] Patient Active Problem List   Diagnosis Date Noted   AMS (altered mental status) 11/02/2021   Generalized weakness    Jaundice    Dark stools 10/24/2021   Serum ammonia increased ([place]) 10/22/2021   Mixed hyperlipidemia 10/21/2021   Restless leg syndrome 10/21/2021   BPH (benign prostatic hyperplasia) 10/21/2021   Seizure disorder ([place]) 10/21/2021   Common bile duct obstruction    Pancreatic lesion    Weight loss    Cholestatic jaundice 10/20/2021   Bipolar I disorder ([place]) 10/20/2021   Hypokalemia 10/20/2021   Lesion of left native kidney 10/20/2021   PCP:  Clinic, [place], [place] [place] [address] Phone: [phone] Fax: [phone]     Social Determinants of Health (SDOH) Interventions    Readmission Risk Interventions     No data to display

## 2021-11-06 NOTE — [place] FL2 (Signed)
  [place] MEDICAID FL2 LEVEL OF CARE SCREENING TOOL     IDENTIFICATION  Patient Name: Kerry [name] Birthdate: [birth year] Sex: male Admission Date (Current Location): 11/02/2021  [facility] and [place] Number:  Producer, television/film/video and Address:  [facility] [place], [address], [place], [place] [address]      Provider Number: [id number]  Attending Physician Name and Address:  Kathrynn Running, MD  Relative Name and Phone Number:       Current Level of Care: Hospital Recommended Level of Care: Skilled Nursing Facility Prior Approval Number:    Date Approved/Denied:   PASRR Number: [id number]  Discharge Plan: SNF    Current Diagnoses: Patient Active Problem List   Diagnosis Date Noted   AMS (altered mental status) 11/02/2021   Generalized weakness    Jaundice    Dark stools 10/24/2021   Serum ammonia increased ([facility]) 10/22/2021   Mixed hyperlipidemia 10/21/2021   Restless leg syndrome 10/21/2021   BPH (benign prostatic hyperplasia) 10/21/2021   Seizure disorder ([facility]) 10/21/2021   Common bile duct obstruction    Pancreatic lesion    Weight loss    Cholestatic jaundice 10/20/2021   Bipolar I disorder ([facility]) 10/20/2021   Hypokalemia 10/20/2021   Lesion of left native kidney 10/20/2021    Orientation RESPIRATION BLADDER Height & Weight     Self, Time, Place  Normal Incontinent Weight:   Height:     BEHAVIORAL SYMPTOMS/MOOD NEUROLOGICAL BOWEL NUTRITION STATUS    Convulsions/Seizures Incontinent Diet (regular)  AMBULATORY STATUS COMMUNICATION OF NEEDS Skin   Limited Assist Verbally Normal                       Personal Care Assistance Level of Assistance  Bathing, Feeding, Dressing Bathing Assistance: Limited assistance Feeding assistance: Limited assistance Dressing Assistance: Limited assistance     Functional Limitations Info             SPECIAL CARE FACTORS FREQUENCY  PT (By licensed PT), OT (By licensed OT)     PT  Frequency: 5x/wk OT Frequency: 5x/wk            Contractures Contractures Info: Not present    Additional Factors Info  Code Status, Allergies Code Status Info: Full Allergies Info: Fluoride           Current Medications (11/06/2021):  This is the current hospital active medication list Current Facility-Administered Medications  Medication Dose Route Frequency Provider Last Rate Last Admin   aspirin EC tablet 81 mg  81 mg Oral Daily Dow Adolph N, DO   81 mg at 11/06/21 1000   divalproex (DEPAKOTE ER) 24 hr tablet 1,000 mg  1,000 mg Oral QHS Shafer, Ludger Nutting, NP   1,000 mg at 11/05/21 2220   enoxaparin (LOVENOX) injection 40 mg  40 mg Subcutaneous Q24H Hall, Carole N, DO   40 mg at 11/06/21 0012   escitalopram (LEXAPRO) tablet 5 mg  5 mg Oral Daily Dow Adolph N, DO   5 mg at 11/06/21 1001   levETIRAcetam (KEPPRA) IVPB 1500 mg/ 100 mL premix  1,500 mg Intravenous Q12H Lynwood Dawley S, DO 400 mL/hr at 11/06/21 1005 1,500 mg at 11/06/21 1005     Discharge Medications: Please see discharge summary for a list of discharge medications.  Relevant Imaging Results:  Relevant Lab Results:   Additional Information SS#: [id number]  Baldemar Lenis, LCSW

## 2021-11-07 ENCOUNTER — Telehealth: Payer: Self-pay

## 2021-11-07 ENCOUNTER — Encounter (HOSPITAL_COMMUNITY)
Admission: EM | Disposition: A | Discharge status: Home / Self Care | Payer: Self-pay | Source: Skilled Nursing Facility | Attending: Internal Medicine

## 2021-11-07 ENCOUNTER — Inpatient Hospital Stay (HOSPITAL_COMMUNITY): Payer: No Typology Code available for payment source | Admitting: Certified Registered Nurse Anesthetist

## 2021-11-07 DIAGNOSIS — G9341 Metabolic encephalopathy: Secondary | ICD-10-CM

## 2021-11-07 LAB — URINALYSIS, COMPLETE (UACMP) WITH MICROSCOPIC
Bilirubin Urine: NEGATIVE
Glucose, UA: NEGATIVE mg/dL
Hgb urine dipstick: NEGATIVE
Ketones, ur: 5 mg/dL — AB
Leukocytes,Ua: NEGATIVE
Nitrite: POSITIVE — AB
Protein, ur: NEGATIVE mg/dL
Specific Gravity, Urine: 1.011 (ref 1.005–1.030)
pH: 9 — ABNORMAL HIGH (ref 5.0–8.0)

## 2021-11-07 LAB — COMPREHENSIVE METABOLIC PANEL
ALT: 21 U/L (ref 0–44)
AST: 21 U/L (ref 15–41)
Albumin: 2.8 g/dL — ABNORMAL LOW (ref 3.5–5.0)
Alkaline Phosphatase: 78 U/L (ref 38–126)
Anion gap: 12 (ref 5–15)
BUN: 17 mg/dL (ref 8–23)
CO2: 23 mmol/L (ref 22–32)
Calcium: 8.8 mg/dL — ABNORMAL LOW (ref 8.9–10.3)
Chloride: 103 mmol/L (ref 98–111)
Creatinine, Ser: 0.64 mg/dL (ref 0.61–1.24)
GFR, Estimated: >60 mL/min (ref >60)
Glucose, Bld: 100 mg/dL — ABNORMAL HIGH (ref 70–99)
Potassium: 3.8 mmol/L (ref 3.5–5.1)
Sodium: 138 mmol/L (ref 135–145)
Total Bilirubin: 1.9 mg/dL — ABNORMAL HIGH (ref 0.3–1.2)
Total Protein: 6.3 g/dL — ABNORMAL LOW (ref 6.5–8.1)

## 2021-11-07 SURGERY — CANCELLED PROCEDURE
Anesthesia: Monitor Anesthesia Care

## 2021-11-07 MED ORDER — LEVETIRACETAM 750 MG PO TABS
1500.0000 mg | ORAL_TABLET | Freq: Two times a day (BID) | ORAL | Status: DC
  Administered 2021-11-07 – 2021-11-14 (×14): 1500 mg via ORAL
  Filled 2021-11-07 (×14): qty 2

## 2021-11-07 MED ORDER — LACTATED RINGERS IV SOLN
INTRAVENOUS | Status: AC | PRN
  Administered 2021-11-07: 1000 mL via INTRAVENOUS

## 2021-11-07 SURGICAL SUPPLY — 14 items

## 2021-11-07 NOTE — Telephone Encounter (Signed)
The pt has been rescheduled to 12/19/21 at 930 am at [facility] with GM.  Arrival time of 8 am.  Nothing to eat or drink after midnight. The pt will be advised at discharge.  I will also mail the information to the home address.    Dr Meridee Score see above information regarding the hospital case.  730 am not available.

## 2021-11-07 NOTE — Telephone Encounter (Signed)
Thanks for update. GM 

## 2021-11-07 NOTE — Progress Notes (Signed)
Brief GI progress note  Patient has had some change in mental status over the last few hours per report of the RN team. Overnight he did have some cloudy urine and a urinalysis was ordered and the patient also received as needed trazodone. Unfortunately this morning the patient is only alert to person and place but not to situation.  He believes he is having an MRI today and x-rays.  I went over the planned procedure of EUS and the risks associated with it.  I gave him an opportunity to try and see if he could remember this and after 10 minutes he could not remember anything other than he was having a MRI and x-rays done today. He previously had declined having any of his family be aware of what was going on with his situation when I had done his EUS/ERCP and he was consentable at that point a few weeks ago.  As such I cannot proceed with EUS today.  Also, I will be out of the area for the next few weeks so at this point the patient is not an adequate candidate for procedures as we do not have the ability to consent him or the ability to consent his family from his prior request. I will have my team work on getting him rescheduled for an EUS attempt in July when I return.  Although not ideal to have a possible lingering lesion within the biliary tree and pancreas that could be causing him problems and could be an underlying malignancy, we cannot do procedures on patients were not consentable.  We will have the patient have a repeat CA 19-9 ordered now that he has been decompressed from a biliary standpoint to see what if any component of the CA 19-9 was a result of GIST biliary obstruction. We will defer further management to the patient's medical service and plan for skilled nursing facility placement as able. I will update the patient's medical service and I will write him for a diet.   Corliss Parish, MD [facility] Office # [phone]

## 2021-11-07 NOTE — Plan of Care (Signed)
Pt is doing well today. Pt is in and out of confusion. Pt stated he has short term memory. Pt worked well with PT/OT.  Problem: Education: Goal: Knowledge of General Education information will improve Description: Including pain rating scale, medication(s)/side effects and non-pharmacologic comfort measures Outcome: Progressing   Problem: Health Behavior/Discharge Planning: Goal: Ability to manage health-related needs will improve Outcome: Progressing   Problem: Clinical Measurements: Goal: Ability to maintain clinical measurements within normal limits will improve Outcome: Progressing Goal: Will remain free from infection Outcome: Progressing Goal: Diagnostic test results will improve Outcome: Progressing Goal: Respiratory complications will improve Outcome: Progressing Goal: Cardiovascular complication will be avoided Outcome: Progressing   Problem: Activity: Goal: Risk for activity intolerance will decrease Outcome: Progressing   Problem: Nutrition: Goal: Adequate nutrition will be maintained Outcome: Progressing   Problem: Coping: Goal: Level of anxiety will decrease Outcome: Progressing   Problem: Elimination: Goal: Will not experience complications related to bowel motility Outcome: Progressing Goal: Will not experience complications related to urinary retention Outcome: Progressing   Problem: Pain Managment: Goal: General experience of comfort will improve Outcome: Progressing   Problem: Safety: Goal: Ability to remain free from injury will improve Outcome: Progressing   Problem: Skin Integrity: Goal: Risk for impaired skin integrity will decrease Outcome: Progressing

## 2021-11-07 NOTE — Progress Notes (Signed)
TRH night cross cover note:   Per the patient's request, I have resumed his outpatient prn trazodone for insomnia.    Kerry Pigg, DO Hospitalist

## 2021-11-07 NOTE — Progress Notes (Addendum)
Occupational Therapy Treatment Patient Details Name: Kerry [name] MRN: [id number] DOB: [birth year] Today's Date: 11/07/2021   History of present illness Pt is a 76y/o male presenting to ED on 6/14 from [facility] secondary to AMS with ?seizures. Head CT negative for any acute findings. EEG revealing coritcal dysfunction in L hemisphere. PMH includes anxiety/depression, PTSD, OSA, tobacco use disorder, HLD, bipolar disorder, seizure disorder, and traumatic SAH.   OT comments  Pt seen for OT session, emphasis on med mgmt and ADLs. Pt lethargic this session disoriented to time and situation, thinking it is morning and wanting to order breakfast when it is mid-afternoon. Pt oriented to place and date, however does not recall why he is in the hospital. Administered pillbox assessment, as pt reports managing 16 medications on his own at ILF. Pt making 46 omission errors, and taking 3x longer than the 5 allotted minutes. When therapist reviewed errors with pt, pt does not understand, confusing words like "daily" with "today", states "he'll manage" at home. Pt with difficulty following 2 step commands during session, able to walk hallway distance, complete bed mobility and ADLs with supervision-minA. Pt presenting with impairments listed below, will follow acutely. Pt unsafe to manage medications and perform ADLs independently at this time, would need 24/7 assist if returning to facility. Continue to recommend SNF at d/c.   Recommendations for follow up therapy are one component of a multi-disciplinary discharge planning process, led by the attending physician.  Recommendations may be updated based on patient status, additional functional criteria and insurance authorization.    Follow Up Recommendations  Skilled nursing-short term rehab (<3 hours/day)    Assistance Recommended at Discharge Frequent or constant Supervision/Assistance  Patient can return home with the following  A little help with  walking and/or transfers;A lot of help with bathing/dressing/bathroom;Assistance with cooking/housework;Direct supervision/assist for medications management;Direct supervision/assist for financial management;Assist for transportation;Help with stairs or ramp for entrance   Equipment Recommendations  None recommended by OT;Other (comment) (defer to next venue of care)    Recommendations for Other Services PT consult    Precautions / Restrictions Precautions Precautions: Fall Restrictions Weight Bearing Restrictions: No       Mobility Bed Mobility Overal bed mobility: Needs Assistance Bed Mobility: Supine to Sit     Supine to sit: Supervision Sit to supine: Supervision        Transfers Overall transfer level: Needs assistance Equipment used: Rolling walker (2 wheels) Transfers: Sit to/from Stand Sit to Stand: Min guard                 Balance Overall balance assessment: Needs assistance Sitting-balance support: Feet supported Sitting balance-Leahy Scale: Fair Sitting balance - Comments: posterior lean if feet are not on floor   Standing balance support: During functional activity, No upper extremity supported Standing balance-Leahy Scale: Fair                             ADL either performed or assessed with clinical judgement   ADL Overall ADL's : Needs assistance/impaired Eating/Feeding: Minimal assistance;Sitting;Bed level Eating/Feeding Details (indicate cue type and reason): for fine motor aspects of task Grooming: Minimal assistance;Standing                   Toilet Transfer: Min guard;Ambulation;Regular Teacher, adult education Details (indicate cue type and reason): simulated in room, slow speed Toileting- Clothing Manipulation and Hygiene: Total assistance Toileting - Clothing Manipulation Details (indicate cue type and  reason): catheter     Functional mobility during ADLs: Min guard;Rolling walker (2 wheels) General ADL  Comments: goal of session was med mgmt IADL task, pt with 46 omission errors, taking 15:26 to complete, over 3x the allotted time for assessment    Extremity/Trunk Assessment Upper Extremity Assessment Upper Extremity Assessment: Generalized weakness   Lower Extremity Assessment Lower Extremity Assessment: Defer to PT evaluation        Vision   Vision Assessment?: No apparent visual deficits   Perception Perception Perception: Not tested   Praxis Praxis Praxis: Not tested    Cognition Arousal/Alertness: Awake/alert Behavior During Therapy: WFL for tasks assessed/performed Overall Cognitive Status: Impaired/Different from baseline Area of Impairment: Safety/judgement, Awareness, Memory                   Current Attention Level: Sustained Memory: Decreased short-term memory Following Commands: Follows one step commands with increased time, Follows multi-step commands inconsistently Safety/Judgement: Decreased awareness of safety, Decreased awareness of deficits Awareness: Emergent Problem Solving: Requires verbal cues, Requires tactile cues General Comments: pt lethargic, disoriented to time and situation thinking it is morning/wanting to order breakfast, pt reoriented that it is 3:15pm; poor insight to deficits, unable to identify errors made with pillbox test despite review        Exercises      Shoulder Instructions       General Comments VSS on RA    Pertinent Vitals/ Pain       Pain Assessment Pain Assessment: No/denies pain  Home Living                                          Prior Functioning/Environment              Frequency  Min 2X/week        Progress Toward Goals  OT Goals(current goals can now be found in the care plan section)  Progress towards OT goals: Progressing toward goals  Acute Rehab OT Goals Patient Stated Goal: to go to [facility] OT Goal Formulation: With patient Time For Goal Achievement:  11/19/21 Potential to Achieve Goals: Fair ADL Goals Pt Will Perform Upper Body Dressing: with supervision;sitting Pt Will Perform Lower Body Dressing: with supervision;sitting/lateral leans;sit to/from stand Pt Will Transfer to Toilet: with supervision;ambulating;regular height toilet Additional ADL Goal #1: pt will be able to follow 3 step trailmaking task in prep for ADLs  Plan Discharge plan remains appropriate;Frequency remains appropriate    Co-evaluation                 AM-PAC OT "6 Clicks" Daily Activity     Outcome Measure   Help from another person eating meals?: A Little Help from another person taking care of personal grooming?: A Little Help from another person toileting, which includes using toliet, bedpan, or urinal?: A Lot Help from another person bathing (including washing, rinsing, drying)?: A Lot Help from another person to put on and taking off regular upper body clothing?: A Lot Help from another person to put on and taking off regular lower body clothing?: A Lot 6 Click Score: 14    End of Session Equipment Utilized During Treatment: Gait belt;Rolling walker (2 wheels)  OT Visit Diagnosis: Unsteadiness on feet (R26.81);Other abnormalities of gait and mobility (R26.89);Muscle weakness (generalized) (M62.81);Other symptoms and signs involving cognitive function   Activity Tolerance Patient tolerated treatment well  Patient Left in bed;with call bell/phone within reach;with bed alarm set   Nurse Communication Mobility status        Time: 0623-7628 OT Time Calculation (min): 46 min  Charges: OT General Charges $OT Visit: 1 Visit OT Treatments $Self Care/Home Management : 23-37 mins $Therapeutic Activity: 8-22 mins  Alfonzo Beers, OTD, OTR/L Acute Rehab [phone]   Mayer Masker 11/07/2021, 3:54 PM

## 2021-11-07 NOTE — Progress Notes (Signed)
Physical Therapy Treatment Patient Details Name: Kerry [name] MRN: [id number] DOB: 1945/12/20 Today's Date: 11/07/2021   History of Present Illness Pt is a 76y/o male presenting to ED on 6/14 from [facility] secondary to AMS with ?seizures. Head CT negative for any acute findings. EEG revealing coritcal dysfunction in L hemisphere. PMH includes anxiety/depression, PTSD, OSA, tobacco use disorder, HLD, bipolar disorder, seizure disorder, and traumatic SAH.    PT Comments    Pt received with report "someone just came in here to bring me to [facility]." Pt oriented however is confused. Pt with noted decreased insight to safety and deficits and is adamant about going to [facility] which is a completely different mental state compared to yesterday's PT session. Pt did agree he needs a RW for safe ambulation as he is unsteady but reports he can bath and dress himself without any help and doesn't need rehab and desires to return to [facility] as that is where "my friend are, my therapist, my 16 medications, my jeans, and my shoes" are. Pt was indep PTA but now demonstrates both cognitive and functional deficits with high fall risk. Pt to benefit from ST-SNF to achieve indep function for safe transition back to [facility]. Aware pt is now refusing. Pt would need 24/7 assist for ADLs and mobility to return safely to [facility] ILF. Acute PT to cont to follow.   Recommendations for follow up therapy are one component of a multi-disciplinary discharge planning process, led by the attending physician.  Recommendations may be updated based on patient status, additional functional criteria and insurance authorization.  Follow Up Recommendations  Skilled nursing-short term rehab (<3 hours/day)     Assistance Recommended at Discharge Frequent or constant Supervision/Assistance  Patient can return home with the following A lot of help with walking and/or transfers;A lot of help with  bathing/dressing/bathroom;Assistance with cooking/housework   Equipment Recommendations  Other (comment) (TBD pending further mobility assessment)    Recommendations for Other Services       Precautions / Restrictions Precautions Precautions: Fall Restrictions Weight Bearing Restrictions: No     Mobility  Bed Mobility Overal bed mobility: Needs Assistance Bed Mobility: Supine to Sit     Supine to sit: Supervision Sit to supine: Supervision   General bed mobility comments: no physical assist needed, increased time, PT did assisted with getting blankets off of his feet    Transfers Overall transfer level: Needs assistance Equipment used: Rolling walker (2 wheels) Transfers: Sit to/from Stand Sit to Stand: Min guard           General transfer comment: required 2 attempts prior to reaching full standing, verbal cues to push up from the bed not pull up on the walker and to reach back for the chair    Ambulation/Gait Ambulation/Gait assistance: Min guard Gait Distance (Feet): 120 Feet Assistive device: Rolling walker (2 wheels) Gait Pattern/deviations: Step-to pattern, Decreased stride length, Shuffle Gait velocity: slow     General Gait Details: very slow, short shuffled steps even with the RW   Stairs             Wheelchair Mobility    Modified Rankin (Stroke Patients Only) Modified Rankin (Stroke Patients Only) Pre-Morbid Rankin Score: No symptoms Modified Rankin: Slight disability     Balance Overall balance assessment: Needs assistance Sitting-balance support: Feet supported Sitting balance-Leahy Scale: Fair     Standing balance support: During functional activity, No upper extremity supported Standing balance-Leahy Scale: Fair Standing balance comment:  walks unsupported, however states that he is weaker compared to baseline                            Cognition Arousal/Alertness: Awake/alert Behavior During Therapy: WFL for  tasks assessed/performed Overall Cognitive Status: Impaired/Different from baseline Area of Impairment: Safety/judgement, Awareness, Memory                   Current Attention Level: Sustained Memory: Decreased short-term memory (pt asked "how do I use this thing?" in regard to RW in which he used yesterday but didn't recall walking with PT or using a walker) Following Commands: Follows one step commands consistently, Follows multi-step commands with increased time Safety/Judgement: Decreased awareness of safety, Decreased awareness of deficits Awareness: Emergent Problem Solving: Requires verbal cues, Requires tactile cues General Comments: pt with decreased insight to deficits today and adamant about going home to [facility] as that is where "my friends are, my pants, my shoes, my 16 medications"        Exercises      General Comments General comments (skin integrity, edema, etc.): VSS on RA      Pertinent Vitals/Pain Pain Assessment Pain Assessment: No/denies pain    Home Living                          Prior Function            PT Goals (current goals can now be found in the care plan section) Acute Rehab PT Goals Patient Stated Goal: unable to state PT Goal Formulation: Patient unable to participate in goal setting Time For Goal Achievement: 11/18/21 Potential to Achieve Goals: Good Progress towards PT goals: Progressing toward goals    Frequency    Min 3X/week      PT Plan Frequency needs to be updated    Co-evaluation              AM-PAC PT "6 Clicks" Mobility   Outcome Measure  Help needed turning from your back to your side while in a flat bed without using bedrails?: None Help needed moving from lying on your back to sitting on the side of a flat bed without using bedrails?: None Help needed moving to and from a bed to a chair (including a wheelchair)?: A Little Help needed standing up from a chair using your arms (e.g.,  wheelchair or bedside chair)?: A Little Help needed to walk in hospital room?: A Little Help needed climbing 3-5 steps with a railing? : A Lot 6 Click Score: 19    End of Session Equipment Utilized During Treatment: Gait belt Activity Tolerance: Patient tolerated treatment well Patient left: with call bell/phone within reach;in chair;with chair alarm set Nurse Communication: Mobility status PT Visit Diagnosis: Other abnormalities of gait and mobility (R26.89)     Time: 2376-2831 PT Time Calculation (min) (ACUTE ONLY): 20 min  Charges:  $Gait Training: 8-22 mins                     Hamilton Shock, PT, DPT Acute Rehabilitation Services Secure chat preferred Office #: [phone]    Iona Hansen 11/07/2021, 2:47 PM

## 2021-11-07 NOTE — Telephone Encounter (Signed)
-----   Message from Lemar Lofty., MD sent at 11/07/2021 12:44 PM EDT ----- Regarding: EUS needs Kerry [name], As we discussed over the phone earlier today, unable to perform patient's procedure as a result of his altered mental status.  At this point recommend we plan to redo his procedure on my hospital week as a planned EUS.  He will remain in the hospital for few more days before going to the skilled nursing facility that is pending currently.  Plan to reach out to them in July so we can work on scheduling him for my Tuesday/Wednesday/Thursday/Friday at the hospital as a 730 case. Thanks. GM

## 2021-11-07 NOTE — TOC Progression Note (Addendum)
Transition of Care [facility]) - Progression Note    Patient Details  Name: Kerry [name] MRN: [id number] Date of Birth: Oct 28, 1945  [facility] CM/SW Contact  Baldemar Lenis, [place] Phone Number: 11/07/2021, 10:48 AM  Clinical Narrative:   CSW noting that patient is confused, unable to provide consent this morning. CSW attempted to reach patient's brother, Dorinda Hill, to discuss SNF offers. Number on file has been disconnected. CSW contacted [name], who is patient's Arboriculturist. She was able to look in the patient's records and provide a number for a veteran's service officer, but did not have any numbers for patient's brother. CSW contacted [facility], who provided a new cell number and a home number. Cell number provided has been disconnected, CSW left a HIPAA compliant voicemail on the home number provided. CSW also attempted to contact patient's VA social worker, Leavy Cella, to see about any other emergency contacts they may have in his primary care records, left a voicemail. Unable to reach patient's family at this time.  UPDATE: CSW received call back from the home number provided for Dorinda Hill, and there is not a Dorinda Hill that lives at that number.     Expected Discharge Plan: Skilled Nursing Facility Barriers to Discharge: Continued Medical Work up  Expected Discharge Plan and Services Expected Discharge Plan: Skilled Nursing Facility     Post Acute Care Choice: Skilled Nursing Facility Living arrangements for the past 2 months: Independent Living Facility                                       Social Determinants of Health (SDOH) Interventions    Readmission Risk Interventions     No data to display

## 2021-11-07 NOTE — Anesthesia Preprocedure Evaluation (Signed)
Anesthesia Evaluation  Patient identified by MRN, date of birth, ID band Patient awake    Reviewed: Allergy & Precautions, NPO status , Patient's Chart, lab work & pertinent test results  Airway Mallampati: II  TM Distance: >3 FB Neck ROM: Full    Dental  (+) Teeth Intact, Implants, Dental Advisory Given   Pulmonary sleep apnea , Current Smoker and Patient abstained from smoking.,    breath sounds clear to auscultation       Cardiovascular negative cardio ROS   Rhythm:Regular Rate:Normal     Neuro/Psych Seizures -,  PSYCHIATRIC DISORDERS Anxiety Bipolar Disorder    GI/Hepatic negative GI ROS, Neg liver ROS,   Endo/Other  negative endocrine ROS  Renal/GU      Musculoskeletal   Abdominal Normal abdominal exam  (+)   Peds  Hematology negative hematology ROS (+)   Anesthesia Other Findings   Reproductive/Obstetrics                             Anesthesia Physical  Anesthesia Plan  ASA: 3  Anesthesia Plan: MAC   Post-op Pain Management: Minimal or no pain anticipated   Induction: Intravenous  PONV Risk Score and Plan: Treatment may vary due to age or medical condition and Propofol infusion  Airway Management Planned: Nasal Cannula  Additional Equipment: None  Intra-op Plan:   Post-operative Plan:   Informed Consent: I have reviewed the patients History and Physical, chart, labs and discussed the procedure including the risks, benefits and alternatives for the proposed anesthesia with the patient or authorized representative who has indicated his/her understanding and acceptance.     Dental advisory given  Plan Discussed with: CRNA  Anesthesia Plan Comments:         Anesthesia Quick Evaluation

## 2021-11-07 NOTE — Progress Notes (Signed)
TRH night cross cover note:   I was notified by RN of the patient's cloudy appearing urine associated with foul smell.  I Subsequently placed order for urinalysis.     Newton Pigg, DO Hospitalist

## 2021-11-07 NOTE — Hospital Course (Signed)
76 y.o. male with medical history significant for bipolar disorder, seizure disorder, traumatic subarachnoid hemorrhage, PTSD, chronic anxiety/depression, tobacco use disorder, hyperlipidemia, obstructive sleep apnea, who presented to [facility] ED from independent living facility after being found in his apartment covered with urine and feces.   He was last seen normal 2 days prior. Due to memory difficulty, he forgets to take medications at times.  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  Status epilepticus broke with Ativan use.  He was transitioned to long-acting forms to help with noncompliance at times.

## 2021-11-07 NOTE — Plan of Care (Signed)

## 2021-11-07 NOTE — Progress Notes (Signed)
Patient is confused and unable to sign consent for EUS.  Pt states he also does not want any family called to get consent.  Patient returned to 3W10, bedside RN aware.  Roselie Awkward, RN

## 2021-11-07 NOTE — Progress Notes (Signed)
Progress Note    Quintin Hjort   [id number]  DOB: 06-11-1945  DOA: 11/02/2021     5 PCP: [facility]  Initial CC: AMS  Hospital Course: 76 y.o. male with medical history significant for bipolar disorder, seizure disorder, traumatic subarachnoid hemorrhage, PTSD, chronic anxiety/depression, tobacco use disorder, hyperlipidemia, obstructive sleep apnea, who presented to [facility] ED from independent living facility after being found in his apartment covered with urine and feces.   He was last seen normal 2 days prior. Due to memory difficulty, he forgets to take medications at times.  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  Status epilepticus broke with Ativan use.  He was transitioned to long-acting forms to help with noncompliance at times.   Interval History:  No events overnight.  Resting in bed in no distress.  During my evaluation, he was alert and oriented x3.  He was able to answer questions and carry on conversation fairly appropriately.  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   Neurology following in consultation. Secondary to medication noncompliance due to forgetfulness. Now on Keppra ER and Depakote ER to help with ease of dosing. Valproic acid er 1000 mg daily and levetiracetam er 1500 mg daily   Seizure disorder: Plan as above   Bipolar disorder/chronic anxiety/depression:  Continue home Lexapro.   Mild rhabdo: Secondary to status epilepticus.  Resolved.     Elevated high-sensitivity troponins: Mild.  No chest pain. Likely secondary to demand ischemia.   Generalized weakness: PT/OT recommend SNF. Continue fall precautions    Obstructive jaundice: Status post recent EUS/ERCP with only atypical cells found.  - patient was to have follow up EUS this week; he was unable to consent in the hospital and plan was for rescheduling  Old records  reviewed in assessment of this patient  Antimicrobials:   DVT prophylaxis:  enoxaparin (LOVENOX) injection 40 mg Start: 11/08/21 0000   Code Status:   Code Status: Full Code  Disposition Plan:  SNF Status is: Inpt  Objective: Blood pressure 112/65, pulse 70, temperature 97.8 F (36.6 C), temperature source Oral, resp. rate 16, SpO2 98 %.  Examination:  Physical Exam Constitutional:      General: He is not in acute distress.    Appearance: Normal appearance.  HENT:     Head: Normocephalic and atraumatic.     Mouth/Throat:     Mouth: Mucous membranes are moist.  Eyes:     Extraocular Movements: Extraocular movements intact.  Cardiovascular:     Rate and Rhythm: Normal rate and regular rhythm.     Heart sounds: Normal heart sounds.  Pulmonary:     Effort: Pulmonary effort is normal. No respiratory distress.     Breath sounds: Normal breath sounds. No wheezing.  Abdominal:     General: Bowel sounds are normal. There is no distension.     Palpations: Abdomen is soft.     Tenderness: There is no abdominal tenderness.  Musculoskeletal:        General: Normal range of motion.     Cervical back: Normal range of motion and neck supple.  Skin:    General: Skin is warm and dry.  Neurological:     Mental Status: He is alert and oriented to person, place, and time.  Psychiatric:        Mood and Affect: Mood normal.      Consultants:  GI Neurology  Procedures:    Data Reviewed: Results for orders placed or performed during the hospital encounter of 11/02/21 (from the past 24 hour(s))  Comprehensive metabolic panel     Status: Abnormal   Collection Time: 11/07/21  1:08 AM  Result Value Ref Range   Sodium 138 135 - 145 mmol/L   Potassium 3.8 3.5 - 5.1 mmol/L   Chloride 103 98 - 111 mmol/L   CO2 23 22 - 32 mmol/L   Glucose, Bld 100 (H) 70 - 99 mg/dL   BUN 17 8 - 23 mg/dL   Creatinine, Ser 3.71 0.61 - 1.24 mg/dL   Calcium 8.8 (L) 8.9 - 10.3 mg/dL   Total Protein 6.3  (L) 6.5 - 8.1 g/dL   Albumin 2.8 (L) 3.5 - 5.0 g/dL   AST 21 15 - 41 U/L   ALT 21 0 - 44 U/L   Alkaline Phosphatase 78 38 - 126 U/L   Total Bilirubin 1.9 (H) 0.3 - 1.2 mg/dL   GFR, Estimated >69 >67 mL/min   Anion gap 12 5 - 15  Urinalysis, Complete w Microscopic     Status: Abnormal   Collection Time: 11/07/21  6:35 AM  Result Value Ref Range   Color, Urine YELLOW YELLOW   APPearance HAZY (A) CLEAR   Specific Gravity, Urine 1.011 1.005 - 1.030   pH 9.0 (H) 5.0 - 8.0   Glucose, UA NEGATIVE NEGATIVE mg/dL   Hgb urine dipstick NEGATIVE NEGATIVE   Bilirubin Urine NEGATIVE NEGATIVE   Ketones, ur 5 (A) NEGATIVE mg/dL   Protein, ur NEGATIVE NEGATIVE mg/dL   Nitrite POSITIVE (A) NEGATIVE   Leukocytes,Ua NEGATIVE NEGATIVE   RBC / HPF 0-5 0 - 5 RBC/hpf   WBC, UA 0-5 0 - 5 WBC/hpf   Bacteria, UA FEW (A) NONE SEEN   Mucus PRESENT    Triple Phosphate Crystal PRESENT     I have Reviewed nursing notes, Vitals, and Lab results since pt's last encounter. Pertinent lab results : see above I have ordered test including BMP, CBC, Mg I have reviewed the last note from staff over past 24 hours I have discussed pt's care plan and test results with nursing staff, case manager   LOS: 5 days   Lewie Chamber, MD [facility] 11/07/2021, 1:57 PM

## 2021-11-08 DIAGNOSIS — G40901 Epilepsy, unspecified, not intractable, with status epilepticus: Secondary | ICD-10-CM | POA: Diagnosis not present

## 2021-11-08 DIAGNOSIS — R4182 Altered mental status, unspecified: Secondary | ICD-10-CM | POA: Diagnosis not present

## 2021-11-08 LAB — COMPREHENSIVE METABOLIC PANEL
ALT: 20 U/L (ref 0–44)
AST: 19 U/L (ref 15–41)
Albumin: 2.9 g/dL — ABNORMAL LOW (ref 3.5–5.0)
Alkaline Phosphatase: 82 U/L (ref 38–126)
Anion gap: 7 (ref 5–15)
BUN: 21 mg/dL (ref 8–23)
CO2: 24 mmol/L (ref 22–32)
Calcium: 9 mg/dL (ref 8.9–10.3)
Chloride: 106 mmol/L (ref 98–111)
Creatinine, Ser: 0.74 mg/dL (ref 0.61–1.24)
GFR, Estimated: >60 mL/min (ref >60)
Glucose, Bld: 100 mg/dL — ABNORMAL HIGH (ref 70–99)
Potassium: 4 mmol/L (ref 3.5–5.1)
Sodium: 137 mmol/L (ref 135–145)
Total Bilirubin: 1.7 mg/dL — ABNORMAL HIGH (ref 0.3–1.2)
Total Protein: 6.4 g/dL — ABNORMAL LOW (ref 6.5–8.1)

## 2021-11-08 LAB — CBC WITH DIFFERENTIAL/PLATELET
Abs Immature Granulocytes: 0.04 10*3/uL (ref 0.00–0.07)
Basophils Absolute: 0 10*3/uL (ref 0.0–0.1)
Basophils Relative: 1 %
Eosinophils Absolute: 0.2 10*3/uL (ref 0.0–0.5)
Eosinophils Relative: 3 %
HCT: 35.4 % — ABNORMAL LOW (ref 39.0–52.0)
Hemoglobin: 12 g/dL — ABNORMAL LOW (ref 13.0–17.0)
Immature Granulocytes: 1 %
Lymphocytes Relative: 37 %
Lymphs Abs: 2.4 10*3/uL (ref 0.7–4.0)
MCH: 30.9 pg (ref 26.0–34.0)
MCHC: 33.9 g/dL (ref 30.0–36.0)
MCV: 91.2 fL (ref 80.0–100.0)
Monocytes Absolute: 0.6 10*3/uL (ref 0.1–1.0)
Monocytes Relative: 10 %
Neutro Abs: 3.2 10*3/uL (ref 1.7–7.7)
Neutrophils Relative %: 48 %
Platelets: 192 10*3/uL (ref 150–400)
RBC: 3.88 MIL/uL — ABNORMAL LOW (ref 4.22–5.81)
RDW: 14 % (ref 11.5–15.5)
WBC: 6.5 10*3/uL (ref 4.0–10.5)
nRBC: 0 % (ref 0.0–0.2)

## 2021-11-08 LAB — CULTURE, BLOOD (ROUTINE X 2)
Culture: NO GROWTH
Culture: NO GROWTH
Special Requests: ADEQUATE

## 2021-11-08 LAB — MAGNESIUM: Magnesium: 2.1 mg/dL (ref 1.7–2.4)

## 2021-11-08 LAB — CANCER ANTIGEN 19-9: CA 19-9: 83 U/mL — ABNORMAL HIGH (ref 0–35)

## 2021-11-08 NOTE — Progress Notes (Signed)
Progress Note    Kerry [name]   VZD:[id number]  DOB: 1945/12/29  DOA: 11/02/2021     6 PCP: [facility], [facility]  Initial CC: AMS  Hospital Course: 76 y.o. male with medical history significant for bipolar disorder, seizure disorder, traumatic subarachnoid hemorrhage, PTSD, chronic anxiety/depression, tobacco use disorder, hyperlipidemia, obstructive sleep apnea, who presented to [facility] ED from independent living facility after being found in his apartment covered with urine and feces.   He was last seen normal 2 days prior. Due to memory difficulty, he forgets to take medications at times.  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  Status epilepticus broke with Ativan use.  He was transitioned to long-acting forms to help with noncompliance at times.   Interval History:  No events overnight. Walked in hall this morning. He still wants to go back to [facility] and is declining SNF at this time.   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   Neurology following in consultation. Secondary to medication noncompliance due to forgetfulness. Now on Keppra ER and Depakote ER to help with ease of dosing. Valproic acid er 1000 mg daily and levetiracetam er 1500 mg daily   Seizure disorder: Plan as above   Bipolar disorder/chronic anxiety/depression:  Continue home Lexapro.   Mild rhabdo: Secondary to status epilepticus.  Resolved.     Elevated high-sensitivity troponins: Mild.  No chest pain. Likely secondary to demand ischemia.   Generalized weakness: PT/OT recommend SNF. Patient declining and wishing to return to ALF instead. Continue fall precautions    Obstructive jaundice: Status post recent EUS/ERCP with only atypical cells found.  - patient was to have follow up EUS this week; he was unable to consent in the hospital and plan was for rescheduling  Old records  reviewed in assessment of this patient  Antimicrobials:   DVT prophylaxis:  enoxaparin (LOVENOX) injection 40 mg Start: 11/08/21 0000   Code Status:   Code Status: Full Code  Disposition Plan:  SNF vs ALF (patient prefers ALF) Status is: Inpt  Objective: Blood pressure 110/69, pulse 79, temperature 98 F (36.7 C), temperature source Oral, resp. rate 16, SpO2 98 %.  Examination:  Physical Exam Constitutional:      General: He is not in acute distress.    Appearance: Normal appearance.  HENT:     Head: Normocephalic and atraumatic.     Mouth/Throat:     Mouth: Mucous membranes are moist.  Eyes:     Extraocular Movements: Extraocular movements intact.  Cardiovascular:     Rate and Rhythm: Normal rate and regular rhythm.     Heart sounds: Normal heart sounds.  Pulmonary:     Effort: Pulmonary effort is normal. No respiratory distress.     Breath sounds: Normal breath sounds. No wheezing.  Abdominal:     General: Bowel sounds are normal. There is no distension.     Palpations: Abdomen is soft.     Tenderness: There is no abdominal tenderness.  Musculoskeletal:        General: Normal range of motion.     Cervical back: Normal range of motion and neck supple.  Skin:    General: Skin is warm and dry.  Neurological:     Mental Status: He is alert and oriented to person, place, and time.  Psychiatric:        Mood and Affect: Mood normal.  Consultants:  GI Neurology  Procedures:    Data Reviewed: Results for orders placed or performed during the hospital encounter of 11/02/21 (from the past 24 hour(s))  Comprehensive metabolic panel     Status: Abnormal   Collection Time: 11/08/21  2:16 AM  Result Value Ref Range   Sodium 137 135 - 145 mmol/L   Potassium 4.0 3.5 - 5.1 mmol/L   Chloride 106 98 - 111 mmol/L   CO2 24 22 - 32 mmol/L   Glucose, Bld 100 (H) 70 - 99 mg/dL   BUN 21 8 - 23 mg/dL   Creatinine, Ser 4.00 0.61 - 1.24 mg/dL   Calcium 9.0 8.9 - 86.7  mg/dL   Total Protein 6.4 (L) 6.5 - 8.1 g/dL   Albumin 2.9 (L) 3.5 - 5.0 g/dL   AST 19 15 - 41 U/L   ALT 20 0 - 44 U/L   Alkaline Phosphatase 82 38 - 126 U/L   Total Bilirubin 1.7 (H) 0.3 - 1.2 mg/dL   GFR, Estimated >61 >95 mL/min   Anion gap 7 5 - 15  CBC with Differential/Platelet     Status: Abnormal   Collection Time: 11/08/21  2:16 AM  Result Value Ref Range   WBC 6.5 4.0 - 10.5 K/uL   RBC 3.88 (L) 4.22 - 5.81 MIL/uL   Hemoglobin 12.0 (L) 13.0 - 17.0 g/dL   HCT 09.3 (L) 26.7 - 12.4 %   MCV 91.2 80.0 - 100.0 fL   MCH 30.9 26.0 - 34.0 pg   MCHC 33.9 30.0 - 36.0 g/dL   RDW 58.0 99.8 - 33.8 %   Platelets 192 150 - 400 K/uL   nRBC 0.0 0.0 - 0.2 %   Neutrophils Relative % 48 %   Neutro Abs 3.2 1.7 - 7.7 K/uL   Lymphocytes Relative 37 %   Lymphs Abs 2.4 0.7 - 4.0 K/uL   Monocytes Relative 10 %   Monocytes Absolute 0.6 0.1 - 1.0 K/uL   Eosinophils Relative 3 %   Eosinophils Absolute 0.2 0.0 - 0.5 K/uL   Basophils Relative 1 %   Basophils Absolute 0.0 0.0 - 0.1 K/uL   Immature Granulocytes 1 %   Abs Immature Granulocytes 0.04 0.00 - 0.07 K/uL  Magnesium     Status: None   Collection Time: 11/08/21  2:16 AM  Result Value Ref Range   Magnesium 2.1 1.7 - 2.4 mg/dL    I have Reviewed nursing notes, Vitals, and Lab results since pt's last encounter. Pertinent lab results : see above I have ordered test including BMP, CBC, Mg I have reviewed the last note from staff over past 24 hours I have discussed pt's care plan and test results with nursing staff, case manager   LOS: 6 days   Lewie Chamber, MD [facility] 11/08/2021, 3:09 PM

## 2021-11-08 NOTE — Plan of Care (Signed)
No changes today. Problem: Education: Goal: Knowledge of General Education information will improve Description: Including pain rating scale, medication(s)/side effects and non-pharmacologic comfort measures Outcome: Progressing   Problem: Health Behavior/Discharge Planning: Goal: Ability to manage health-related needs will improve Outcome: Progressing   Problem: Clinical Measurements: Goal: Ability to maintain clinical measurements within normal limits will improve Outcome: Progressing Goal: Will remain free from infection Outcome: Progressing Goal: Diagnostic test results will improve Outcome: Progressing Goal: Respiratory complications will improve Outcome: Progressing Goal: Cardiovascular complication will be avoided Outcome: Progressing   Problem: Activity: Goal: Risk for activity intolerance will decrease Outcome: Progressing   Problem: Nutrition: Goal: Adequate nutrition will be maintained Outcome: Progressing   Problem: Coping: Goal: Level of anxiety will decrease Outcome: Progressing   Problem: Elimination: Goal: Will not experience complications related to bowel motility Outcome: Progressing Goal: Will not experience complications related to urinary retention Outcome: Progressing   Problem: Pain Managment: Goal: General experience of comfort will improve Outcome: Progressing   Problem: Safety: Goal: Ability to remain free from injury will improve Outcome: Progressing   Problem: Skin Integrity: Goal: Risk for impaired skin integrity will decrease Outcome: Progressing

## 2021-11-08 NOTE — TOC Progression Note (Signed)
Transition of Care [facility]) - Progression Note    Patient Details  Name: Kerry [name] MRN: [id number] Date of Birth: 1945-07-31  Transition of Care [facility]) CM/SW [place], [place] Phone Number: 11/08/2021, 3:42 PM  Clinical Narrative:   CSW met with patient to discuss SNF, patient is still refusing. CSW received call from the Development worker, international aid at [employer], [place] [phone]) that they also have concerns about patient managing in independent living, specifically regarding medication management. [employer] secretary with [facility] for medication reminders, patient would need to set that up. CSW met with patient to discuss and patient is agreeable. CSW contacted patient's fiduciary with the [place], Shirlean Mylar, to discuss costs, and per Shirlean Mylar the patient is able to afford it. CSW received contact information for Estill Bamberg with [facility] [phone]) to provide referral information. Per [place] may cover because they have a contract, will need to discuss with patient's Education officer, museum. Otherwise, need to update Estill Bamberg when the patient is discharging. CSW to follow.    Expected Discharge Plan: [place] Barriers to Discharge: Continued Medical Work up  Expected Discharge Plan and Services Expected Discharge Plan: [place] Choice: [place] arrangements for the past 2 months: [place]                                       Social Determinants of Health (SDOH) Interventions    Readmission Risk Interventions     No data to display

## 2021-11-08 NOTE — Plan of Care (Signed)

## 2021-11-09 DIAGNOSIS — R4182 Altered mental status, unspecified: Secondary | ICD-10-CM | POA: Diagnosis not present

## 2021-11-09 NOTE — TOC Progression Note (Addendum)
Transition of Care [facility]) - Progression Note    Patient Details  Name: Kerry [name] MRN: [id number] Date of Birth: July 24, 1945  Transition of Care [facility]) CM/SW Contact  Carley Hammed, [place] Phone Number: 11/09/2021, 11:22 AM  Clinical Narrative:    CSW attempted to follow up with [facility] SW Leavy Cella ([id number]) to determine whether they would cover [facility] when he is ready to return to [facility] ILF. VM left. CSW will follow up with Facility and Renea Ee w/ options once CSW speaks w/ [place]. Will follow for DC and transportation needs.   3:00  CSW has attempted to call the [place] many times with no answer from [place] SW. Several messages left on VM and with reception. CSW spoke with Rene Kocher with [facility] who noted she would follow up with [employer] and [facility]. She stated if [facility] was not covered, [facility] could provide HH. Attempted to call Rene Kocher back, left VM.   4:15 CSW has not received any follow up calls on pt's DC plan. Unable to speak with anyone at the [place]. TOC will continue to follow. Expected Discharge Plan: Skilled Nursing Facility Barriers to Discharge: Continued Medical Work up  Expected Discharge Plan and Services Expected Discharge Plan: Skilled Nursing Facility     Post Acute Care Choice: Skilled Nursing Facility Living arrangements for the past 2 months: Independent Living Facility                                       Social Determinants of Health (SDOH) Interventions    Readmission Risk Interventions     No data to display

## 2021-11-09 NOTE — Progress Notes (Signed)
Progress Note    Kerry [name]   [id number]  DOB: 11-Oct-1945  DOA: 11/02/2021     7 PCP: [facility]  Initial CC: AMS  Hospital Course: 76 y.o. male with medical history significant for bipolar disorder, seizure disorder, traumatic subarachnoid hemorrhage, PTSD, chronic anxiety/depression, tobacco use disorder, hyperlipidemia, obstructive sleep apnea, who presented to [facility] ED from independent living facility after being found in his apartment covered with urine and feces.   He was last seen normal 2 days prior. Due to memory difficulty, he forgets to take medications at times.  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  Status epilepticus broke with Ativan use.  He was transitioned to long-acting forms to help with noncompliance at times.   Interval History:  No events overnight.  He is still wanting to go back to [facility]. Says he's feeling lonely in the hospital.   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   Neurology following in consultation. Secondary to medication noncompliance due to forgetfulness. Now on Keppra ER and Depakote ER to help with ease of dosing. Valproic acid er 1000 mg daily and levetiracetam er 1500 mg daily   Seizure disorder: Plan as above   Bipolar disorder/chronic anxiety/depression:  Continue home Lexapro.   Mild rhabdo: Secondary to status epilepticus.  Resolved.     Elevated high-sensitivity troponins: Mild.  No chest pain. Likely secondary to demand ischemia.   Generalized weakness: PT/OT recommend SNF. Patient declining and wishing to return to ALF instead. Continue fall precautions    Obstructive jaundice: Status post recent EUS/ERCP with only atypical cells found.  - patient was to have follow up EUS this week; he was unable to consent in the hospital and plan was for rescheduling  Old records reviewed in assessment of  this patient  Antimicrobials:   DVT prophylaxis:  enoxaparin (LOVENOX) injection 40 mg Start: 11/08/21 0000   Code Status:   Code Status: Full Code  Disposition Plan:  SNF vs ALF (patient prefers ALF) Status is: Inpt  Objective: Blood pressure 99/62, pulse 66, temperature 98.2 F (36.8 C), temperature source Oral, resp. rate 16, SpO2 99 %.  Examination:  Physical Exam Constitutional:      General: He is not in acute distress.    Appearance: Normal appearance.  HENT:     Head: Normocephalic and atraumatic.     Mouth/Throat:     Mouth: Mucous membranes are moist.  Eyes:     Extraocular Movements: Extraocular movements intact.  Cardiovascular:     Rate and Rhythm: Normal rate and regular rhythm.     Heart sounds: Normal heart sounds.  Pulmonary:     Effort: Pulmonary effort is normal. No respiratory distress.     Breath sounds: Normal breath sounds. No wheezing.  Abdominal:     General: Bowel sounds are normal. There is no distension.     Palpations: Abdomen is soft.     Tenderness: There is no abdominal tenderness.  Musculoskeletal:        General: Normal range of motion.     Cervical back: Normal range of motion and neck supple.  Skin:    General: Skin is warm and dry.  Neurological:     Mental Status: He is alert and oriented to person, place, and time.  Psychiatric:        Mood and Affect: Mood normal.      Consultants:  GI  Neurology  Procedures:    Data Reviewed: No results found for this or any previous visit (from the past 24 hour(s)).   I have Reviewed nursing notes, Vitals, and Lab results since pt's last encounter. Pertinent lab results : see above I have reviewed the last note from staff over past 24 hours I have discussed pt's care plan and test results with nursing staff, case manager   LOS: 7 days   Lewie Chamber, MD [facility] 11/09/2021, 2:31 PM

## 2021-11-09 NOTE — Plan of Care (Signed)
Pt doing well. Walked the hall. Keep stating he is ready to be discharged. Problem: Education: Goal: Knowledge of General Education information will improve Description: Including pain rating scale, medication(s)/side effects and non-pharmacologic comfort measures Outcome: Progressing   Problem: Health Behavior/Discharge Planning: Goal: Ability to manage health-related needs will improve Outcome: Progressing   Problem: Clinical Measurements: Goal: Ability to maintain clinical measurements within normal limits will improve Outcome: Progressing Goal: Will remain free from infection Outcome: Progressing Goal: Diagnostic test results will improve Outcome: Progressing Goal: Respiratory complications will improve Outcome: Progressing Goal: Cardiovascular complication will be avoided Outcome: Progressing   Problem: Activity: Goal: Risk for activity intolerance will decrease Outcome: Progressing   Problem: Nutrition: Goal: Adequate nutrition will be maintained Outcome: Progressing   Problem: Coping: Goal: Level of anxiety will decrease Outcome: Progressing   Problem: Elimination: Goal: Will not experience complications related to bowel motility Outcome: Progressing Goal: Will not experience complications related to urinary retention Outcome: Progressing   Problem: Pain Managment: Goal: General experience of comfort will improve Outcome: Progressing   Problem: Safety: Goal: Ability to remain free from injury will improve Outcome: Progressing   Problem: Skin Integrity: Goal: Risk for impaired skin integrity will decrease Outcome: Progressing

## 2021-11-09 NOTE — Plan of Care (Signed)
  Problem: Education: Goal: Knowledge of General Education information will improve Description: Including pain rating scale, medication(s)/side effects and non-pharmacologic comfort measures Outcome: Progressing   Problem: Health Behavior/Discharge Planning: Goal: Ability to manage health-related needs will improve Outcome: Progressing   Problem: Clinical Measurements: Goal: Ability to maintain clinical measurements within normal limits will improve Outcome: Progressing Goal: Will remain free from infection Outcome: Progressing   Problem: Activity: Goal: Risk for activity intolerance will decrease Outcome: Progressing   Problem: Nutrition: Goal: Adequate nutrition will be maintained Outcome: Progressing   Problem: Safety: Goal: Ability to remain free from injury will improve Outcome: Progressing
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
## 2021-11-10 DIAGNOSIS — R4182 Altered mental status, unspecified: Secondary | ICD-10-CM | POA: Diagnosis not present

## 2021-11-10 NOTE — Progress Notes (Signed)
Progress Note    Kerry [name]   UJW:[id number]  DOB: 10-07-1945  DOA: 11/02/2021     8 PCP: [facility], [facility]  Initial CC: AMS  Hospital Course: 76 y.o. male with medical history significant for bipolar disorder, seizure disorder, traumatic subarachnoid hemorrhage, PTSD, chronic anxiety/depression, tobacco use disorder, hyperlipidemia, obstructive sleep apnea, who presented to [facility] ED from independent living facility after being found in his apartment covered with urine and feces.   He was last seen normal 2 days prior. Due to memory difficulty, he forgets to take medications at times.  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  Status epilepticus broke with Ativan use.  He was transitioned to long-acting forms to help with noncompliance at times.   Interval History:  Worked better with PT today. If can get more sessions over the weekend, might be able to discharge back to [facility] on Monday.   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   Neurology following in consultation. Secondary to medication noncompliance due to forgetfulness. Now on Keppra ER and Depakote ER to help with ease of dosing. Valproic acid er 1000 mg daily and levetiracetam er 1500 mg daily   Seizure disorder: Plan as above   Bipolar disorder/chronic anxiety/depression:  Continue home Lexapro.   Mild rhabdo: Secondary to status epilepticus.  Resolved.     Elevated high-sensitivity troponins: Mild.  No chest pain. Likely secondary to demand ischemia.   Generalized weakness: PT/OT recommend SNF. Patient declining and wishing to return to ALF instead. Continue fall precautions    Obstructive jaundice: Status post recent EUS/ERCP with only atypical cells found.  - patient was to have follow up EUS this week; he was unable to consent in the hospital and plan was for rescheduling  Old  records reviewed in assessment of this patient  Antimicrobials:   DVT prophylaxis:  enoxaparin (LOVENOX) injection 40 mg Start: 11/08/21 0000   Code Status:   Code Status: Full Code  Disposition Plan:  SNF vs ALF (patient prefers ALF) Status is: Inpt  Objective: Blood pressure 120/68, pulse 74, temperature 98 F (36.7 C), temperature source Oral, resp. rate 16, SpO2 98 %.  Examination:  Physical Exam Constitutional:      General: He is not in acute distress.    Appearance: Normal appearance.  HENT:     Head: Normocephalic and atraumatic.     Mouth/Throat:     Mouth: Mucous membranes are moist.  Eyes:     Extraocular Movements: Extraocular movements intact.  Cardiovascular:     Rate and Rhythm: Normal rate and regular rhythm.     Heart sounds: Normal heart sounds.  Pulmonary:     Effort: Pulmonary effort is normal. No respiratory distress.     Breath sounds: Normal breath sounds. No wheezing.  Abdominal:     General: Bowel sounds are normal. There is no distension.     Palpations: Abdomen is soft.     Tenderness: There is no abdominal tenderness.  Musculoskeletal:        General: Normal range of motion.     Cervical back: Normal range of motion and neck supple.  Skin:    General: Skin is warm and dry.  Neurological:     Mental Status: He is alert and oriented to person, place, and time.  Psychiatric:        Mood and Affect: Mood normal.  Consultants:  GI Neurology  Procedures:    Data Reviewed: No results found for this or any previous visit (from the past 24 hour(s)).   I have Reviewed nursing notes, Vitals, and Lab results since pt's last encounter. Pertinent lab results : see above I have reviewed the last note from staff over past 24 hours I have discussed pt's care plan and test results with nursing staff, case manager   LOS: 8 days   Lewie Chamber, MD [facility] 11/10/2021, 1:32 PM

## 2021-11-11 DIAGNOSIS — F05 Delirium due to known physiological condition: Secondary | ICD-10-CM

## 2021-11-11 DIAGNOSIS — G40901 Epilepsy, unspecified, not intractable, with status epilepticus: Secondary | ICD-10-CM | POA: Diagnosis not present

## 2021-11-11 DIAGNOSIS — R4182 Altered mental status, unspecified: Secondary | ICD-10-CM | POA: Diagnosis not present

## 2021-11-11 MED ORDER — ORAL CARE MOUTH RINSE: 15.0000 mL | OROMUCOSAL | Status: DC | PRN

## 2021-11-12 DIAGNOSIS — G40901 Epilepsy, unspecified, not intractable, with status epilepticus: Secondary | ICD-10-CM | POA: Diagnosis not present

## 2021-11-12 MED ORDER — ACETAMINOPHEN 325 MG PO TABS
650.0000 mg | ORAL_TABLET | ORAL | Status: DC | PRN
  Administered 2021-11-12 – 2021-11-13 (×2): 650 mg via ORAL
  Filled 2021-11-12 (×2): qty 2

## 2021-11-12 MED ORDER — ACETAMINOPHEN 325 MG PO TABS
650.0000 mg | ORAL_TABLET | Freq: Four times a day (QID) | ORAL | Status: DC | PRN
  Administered 2021-11-12: 650 mg via ORAL
  Filled 2021-11-12: qty 2

## 2021-11-13 ENCOUNTER — Other Ambulatory Visit (HOSPITAL_COMMUNITY): Payer: Self-pay

## 2021-11-13 DIAGNOSIS — G40901 Epilepsy, unspecified, not intractable, with status epilepticus: Secondary | ICD-10-CM | POA: Diagnosis not present

## 2021-11-13 MED ORDER — DIVALPROEX SODIUM ER 500 MG PO TB24
1000.0000 mg | ORAL_TABLET | Freq: Every day | ORAL | 5 refills | Status: DC
  Filled 2021-11-13: qty 30, 15d supply, fill #0

## 2021-11-13 MED ORDER — LEVETIRACETAM 750 MG PO TABS
1500.0000 mg | ORAL_TABLET | Freq: Two times a day (BID) | ORAL | 5 refills | Status: DC
  Filled 2021-11-13: qty 60, 15d supply, fill #0
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
## 2021-11-14 DIAGNOSIS — G40901 Epilepsy, unspecified, not intractable, with status epilepticus: Secondary | ICD-10-CM | POA: Diagnosis not present

## 2021-11-14 NOTE — TOC Transition Note (Signed)
Transition of Care [facility]) - CM/SW Discharge Note   Patient Details  Name: Ronan Cochell MRN: [id number] Date of Birth: January 10, 1946  Transition of Care [facility]) CM/SW Contact:  Baldemar Lenis, LCSW Phone Number: 11/14/2021, 10:12 AM   Clinical Narrative:   CSW confirmed with [facility] bed availability and sent discharge summary. Patient in agreement. CSW arranged transportation with [facility] for next available, bed will be available at 11 am.   Nurse to call report to [phone], Room 3238.    Final next level of care: Skilled Nursing Facility Barriers to Discharge: Barriers Resolved   Patient Goals and CMS Choice Patient states their goals for this hospitalization and ongoing recovery are:: to be able to get back home CMS Medicare.gov Compare Post Acute Care list provided to:: Patient Choice offered to / list presented to : Patient  Discharge Placement              Patient chooses bed at: [facility] Patient to be transferred to facility by: [facility] Name of family member notified: Self Patient and family notified of of transfer: 11/14/21  Discharge Plan and Services     Post Acute Care Choice: Skilled Nursing Facility                               Social Determinants of Health (SDOH) Interventions     Readmission Risk Interventions     No data to display

## 2021-11-15 ENCOUNTER — Telehealth: Payer: Self-pay | Admitting: Gastroenterology

## 2021-11-15 NOTE — Telephone Encounter (Signed)
Kerry [name], from [facility], called trying to schedule a follow-up appointment with Dr. Meridee Score from the EUS and ERCP.  The appointment was scheduled for 8/23 at 2:50 p.m.  However, patient also has an EGD scheduled at [facility] 8/1.  With that being said, should the follow up appointment be prior to his next procedure or is it even necessary?  Could this be a follow up for all three procedures?  Also, Kerry [name] needs the instructions for the upcoming procedure (date, time, etc.) on 8/1.  Please call Kerry [name] at [phone] and advise.  Thank you.

## 2021-11-21 ENCOUNTER — Emergency Department (HOSPITAL_COMMUNITY)
Admission: EM | Admit: 2021-11-21 | Discharge: 2021-11-22 | Disposition: A | Discharge status: Home / Self Care | Payer: No Typology Code available for payment source | Attending: Emergency Medicine | Admitting: Emergency Medicine

## 2021-11-21 ENCOUNTER — Encounter (HOSPITAL_COMMUNITY): Payer: Self-pay

## 2021-11-21 ENCOUNTER — Other Ambulatory Visit: Payer: Self-pay

## 2021-11-21 DIAGNOSIS — M79672 Pain in left foot: Secondary | ICD-10-CM | POA: Diagnosis not present

## 2021-11-21 DIAGNOSIS — M79671 Pain in right foot: Secondary | ICD-10-CM | POA: Insufficient documentation

## 2021-11-21 NOTE — ED Provider Notes (Signed)
[facility]-EMERGENCY DEPT [facility] Emergency Department Provider Note MRN:  [id number]  Arrival date & time: 11/22/21     Chief Complaint   Foot Pain   History of Present Illness   Kerry [name] is a 76 y.o. year-old male presents to the ED with chief complaint of bilateral foot and leg pain.  He states that he "exercises too much" and feels sore in his legs.  He is from [facility] nursing home and has history of subarachnoid hemorrhage.  He denies any recent illnesses.  He states that a "massage would help his feet."  History provided by patient.   Review of Systems  Pertinent review of systems noted in HPI.    Physical Exam   Vitals:   11/21/21 2310  BP: 118/63  Pulse: 75  Resp: 18  Temp: 98.1 F (36.7 C)  SpO2: 97%    CONSTITUTIONAL:  well-appearing, NAD NEURO:  Alert and oriented x 3, CN 3-12 grossly intact EYES:  eyes equal and reactive ENT/NECK:  Supple, no stridor  CARDIO:  normal rate, regular rhythm, appears well-perfused  PULM:  No respiratory distress,  GI/GU:  non-distended,  MSK/SPINE:  No gross deformities, no edema, moves all extremities, no calf tenderness SKIN:  no rash, atraumatic   *Additional and/or pertinent findings included in MDM below  Diagnostic and Interventional Summary    EKG Interpretation  Date/Time:    Ventricular Rate:    PR Interval:    QRS Duration:   QT Interval:    QTC Calculation:   R Axis:     Text Interpretation:         Labs Reviewed  CBC WITH DIFFERENTIAL/PLATELET - Abnormal; Notable for the following components:      Result Value   RBC 3.30 (*)    Hemoglobin 10.0 (*)    HCT 30.5 (*)    All other components within normal limits  CK - Abnormal; Notable for the following components:   Total CK 43 (*)    All other components within normal limits  COMPREHENSIVE METABOLIC PANEL - Abnormal; Notable for the following components:   Glucose, Bld 131 (*)    Calcium 8.3 (*)    Albumin 3.0 (*)    All other  components within normal limits    No orders to display    Medications - No data to display   Procedures  /  Critical Care Procedures  ED Course and Medical Decision Making  I have reviewed the triage vital signs, the nursing notes, and pertinent available records from the EMR.  Social Determinants Affecting Complexity of Care: Patient has no clinically significant social determinants affecting this chief complaint..   ED Course:   Patient here with leg cramps/soreness bilaterally.  Top differential diagnoses include delayed onset muscle soreness, chronic pain, less likely rhabdo. Medical Decision Making Patient here with bilateral foot pain.  He states that he has been exercising too much and that his feet feel sore.  He is requesting a massage.  I informed the patient that we do not give massages to the emergency department.  Laboratory work-up shows no evidence of rhabdomyolysis, no significant electrolyte derangement.  Patient is stable and can follow-up with his primary care doctor.  Amount and/or Complexity of Data Reviewed Labs: ordered.    Details: CK is not significantly elevated, doubt rhabdomyolysis, otherwise reassuring labs.     Consultants: No consultations were needed in caring for this patient.   Treatment and Plan: Emergency department workup does not suggest an  emergent condition requiring admission or immediate intervention beyond  what has been performed at this time. The patient is safe for discharge and has  been instructed to return immediately for worsening symptoms, change in  symptoms or any other concerns    Final Clinical Impressions(s) / ED Diagnoses     ICD-10-CM   1. Foot pain, right  M79.671     2. Foot pain, left  M79.672       ED Discharge Orders     None         Discharge Instructions Discussed with and Provided to Patient:     Discharge Instructions      Your emergency department workup showed no serious findings. Please  follow-up with your doctor.       Roxy Horseman, PA-C 11/22/21 0109    Sloan Leiter, DO 11/22/21 [phone]

## 2021-11-21 NOTE — ED Triage Notes (Addendum)
Patient BIB [facility] from [facility]. [place] Spaniel he has been walking around too much, which is why both of his feet and ankles hurt/ are sore. In triage, patient walked to the bathroom. History of subarachnoid head bleed.   [facility] told EMS that before he can return they need to be called.

## 2021-11-22 ENCOUNTER — Encounter (HOSPITAL_COMMUNITY): Payer: Self-pay

## 2021-11-22 ENCOUNTER — Other Ambulatory Visit: Payer: Self-pay

## 2021-11-22 ENCOUNTER — Emergency Department (HOSPITAL_COMMUNITY)
Admission: EM | Admit: 2021-11-22 | Discharge: 2021-11-22 | Disposition: A | Discharge status: Home / Self Care | Payer: No Typology Code available for payment source | Attending: Emergency Medicine | Admitting: Emergency Medicine

## 2021-11-22 DIAGNOSIS — Z20822 Contact with and (suspected) exposure to covid-19: Secondary | ICD-10-CM | POA: Insufficient documentation

## 2021-11-22 DIAGNOSIS — R451 Restlessness and agitation: Secondary | ICD-10-CM | POA: Insufficient documentation

## 2021-11-22 DIAGNOSIS — R4689 Other symptoms and signs involving appearance and behavior: Secondary | ICD-10-CM | POA: Diagnosis not present

## 2021-11-22 HISTORY — DX: Other symptoms and signs involving appearance and behavior: R46.89

## 2021-11-22 LAB — COMPREHENSIVE METABOLIC PANEL
ALT: 13 U/L (ref 0–44)
AST: 18 U/L (ref 15–41)
Albumin: 3 g/dL — ABNORMAL LOW (ref 3.5–5.0)
Alkaline Phosphatase: 55 U/L (ref 38–126)
Anion gap: 5 (ref 5–15)
BUN: 10 mg/dL (ref 8–23)
CO2: 26 mmol/L (ref 22–32)
Calcium: 8.3 mg/dL — ABNORMAL LOW (ref 8.9–10.3)
Chloride: 108 mmol/L (ref 98–111)
Creatinine, Ser: 0.73 mg/dL (ref 0.61–1.24)
GFR, Estimated: >60 mL/min (ref >60)
Glucose, Bld: 131 mg/dL — ABNORMAL HIGH (ref 70–99)
Potassium: 3.5 mmol/L (ref 3.5–5.1)
Sodium: 139 mmol/L (ref 135–145)
Total Bilirubin: 0.9 mg/dL (ref 0.3–1.2)
Total Protein: 6.5 g/dL (ref 6.5–8.1)

## 2021-11-22 LAB — CBC WITH DIFFERENTIAL/PLATELET
Abs Immature Granulocytes: 0.03 10*3/uL (ref 0.00–0.07)
Basophils Absolute: 0 10*3/uL (ref 0.0–0.1)
Basophils Relative: 1 %
Eosinophils Absolute: 0.2 10*3/uL (ref 0.0–0.5)
Eosinophils Relative: 2 %
HCT: 30.5 % — ABNORMAL LOW (ref 39.0–52.0)
Hemoglobin: 10 g/dL — ABNORMAL LOW (ref 13.0–17.0)
Immature Granulocytes: 1 %
Lymphocytes Relative: 32 %
Lymphs Abs: 2.1 10*3/uL (ref 0.7–4.0)
MCH: 30.3 pg (ref 26.0–34.0)
MCHC: 32.8 g/dL (ref 30.0–36.0)
MCV: 92.4 fL (ref 80.0–100.0)
Monocytes Absolute: 0.8 10*3/uL (ref 0.1–1.0)
Monocytes Relative: 13 %
Neutro Abs: 3.3 10*3/uL (ref 1.7–7.7)
Neutrophils Relative %: 51 %
Platelets: 210 10*3/uL (ref 150–400)
RBC: 3.3 MIL/uL — ABNORMAL LOW (ref 4.22–5.81)
RDW: 13.7 % (ref 11.5–15.5)
WBC: 6.4 10*3/uL (ref 4.0–10.5)
nRBC: 0 % (ref 0.0–0.2)

## 2021-11-22 LAB — RESP PANEL BY RT-PCR (FLU A&B, COVID) ARPGX2
Influenza A by PCR: NEGATIVE
Influenza B by PCR: NEGATIVE
SARS Coronavirus 2 by RT PCR: NEGATIVE

## 2021-11-22 LAB — CK: Total CK: 43 U/L — ABNORMAL LOW (ref 49–397)

## 2021-11-22 MED ORDER — PANTOPRAZOLE SODIUM 40 MG PO TBEC
40.0000 mg | DELAYED_RELEASE_TABLET | Freq: Every day | ORAL | Status: DC
  Administered 2021-11-22: 40 mg via ORAL
  Filled 2021-11-22: qty 1

## 2021-11-22 MED ORDER — DIVALPROEX SODIUM ER 500 MG PO TB24: 1000.0000 mg | ORAL_TABLET | Freq: Every day | ORAL | Status: DC

## 2021-11-22 MED ORDER — ROSUVASTATIN CALCIUM 5 MG PO TABS
5.0000 mg | ORAL_TABLET | Freq: Every day | ORAL | Status: DC
Start: 2021-11-22 — End: 2021-11-22

## 2021-11-22 MED ORDER — ASPIRIN 81 MG PO TBEC
81.0000 mg | DELAYED_RELEASE_TABLET | Freq: Every day | ORAL | Status: DC
  Administered 2021-11-22: 81 mg via ORAL
  Filled 2021-11-22: qty 1

## 2021-11-22 MED ORDER — ZOLPIDEM TARTRATE 5 MG PO TABS: 5.0000 mg | ORAL_TABLET | Freq: Every evening | ORAL | Status: DC | PRN

## 2021-11-22 MED ORDER — ESCITALOPRAM OXALATE 10 MG PO TABS
5.0000 mg | ORAL_TABLET | Freq: Every day | ORAL | Status: DC
  Administered 2021-11-22: 5 mg via ORAL
  Filled 2021-11-22: qty 1

## 2021-11-22 MED ORDER — FERROUS GLUCONATE 324 (38 FE) MG PO TABS
324.0000 mg | ORAL_TABLET | Freq: Every day | ORAL | Status: DC
  Administered 2021-11-22: 324 mg via ORAL
  Filled 2021-11-22: qty 1

## 2021-11-22 MED ORDER — TRAZODONE HCL 50 MG PO TABS: 75.0000 mg | ORAL_TABLET | Freq: Every evening | ORAL | Status: DC | PRN

## 2021-11-22 MED ORDER — NICOTINE 14 MG/24HR TD PT24
14.0000 mg | MEDICATED_PATCH | Freq: Every day | TRANSDERMAL | Status: DC
  Administered 2021-11-22: 14 mg via TRANSDERMAL
  Filled 2021-11-22: qty 1

## 2021-11-22 MED ORDER — VITAMIN D 25 MCG (1000 UNIT) PO TABS: 2000.0000 [IU] | ORAL_TABLET | Freq: Every day | ORAL | Status: DC

## 2021-11-22 MED ORDER — MELATONIN 3 MG PO TABS: 3.0000 mg | ORAL_TABLET | Freq: Every day | ORAL | Status: DC

## 2021-11-22 MED ORDER — QUETIAPINE FUMARATE 100 MG PO TABS: 100.0000 mg | ORAL_TABLET | Freq: Every day | ORAL | Status: DC

## 2021-11-22 MED ORDER — ROPINIROLE HCL 1 MG PO TABS: 1.0000 mg | ORAL_TABLET | Freq: Every day | ORAL | Status: DC

## 2021-11-22 MED ORDER — LEVETIRACETAM 500 MG PO TABS
1500.0000 mg | ORAL_TABLET | Freq: Every day | ORAL | Status: DC
  Administered 2021-11-22: 1500 mg via ORAL
  Filled 2021-11-22: qty 3

## 2021-11-22 MED ORDER — ASCORBIC ACID 500 MG PO TABS
500.0000 mg | ORAL_TABLET | Freq: Every day | ORAL | Status: DC
  Administered 2021-11-22: 500 mg via ORAL
  Filled 2021-11-22: qty 1

## 2021-11-22 MED ORDER — DONEPEZIL HCL 5 MG PO TABS
5.0000 mg | ORAL_TABLET | Freq: Every morning | ORAL | Status: DC
  Administered 2021-11-22: 5 mg via ORAL
  Filled 2021-11-22: qty 1

## 2021-11-22 MED ORDER — FINASTERIDE 5 MG PO TABS
5.0000 mg | ORAL_TABLET | Freq: Every day | ORAL | Status: DC
  Administered 2021-11-22: 5 mg via ORAL
  Filled 2021-11-22: qty 1

## 2021-11-22 NOTE — ED Notes (Signed)
Pt belongings returned to patient.  

## 2021-11-22 NOTE — ED Notes (Addendum)
[facility] was contacted and they made this nurse aware that they were not accepting him back d/t his behaviors that were presented at their facility.

## 2021-11-22 NOTE — ED Notes (Addendum)
Charge RN Felicia informed me that [facility] has been speaking with Occupational psychologist, and that the administrator has approved pt to be returned to facility. Arranging discharge transportation with [facility].
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
## 2021-11-22 NOTE — ED Notes (Signed)
I provided reinforced discharge education based off of discharge instructions. Pt acknowledged and understood my education. Pt had no further questions/concerns for provider/myself.  °

## 2021-11-22 NOTE — ED Notes (Signed)
Pt ambulatory without any staff assistance, pt maintains steady and equal gait.

## 2021-11-22 NOTE — ED Notes (Signed)
Attempted to call [facility], [phone] to provide report. Secretary informed me that "The pt cannot return until the administrator approves his return. They will not be back until morning."   I notified charge nurse Felicia.

## 2021-11-22 NOTE — Progress Notes (Signed)
TOC CSW received a consult on pt's return to his facility. Pt is from [facility] for rehab with aggressive behaviors. CSW spoke with Alinda Money the administrator from [facility], he reported they cannot accept pt back due to his behaviors. He reported pt was only there for short term rehab, "not even two weeks."  He reported pt is from [facility] independent living.   CSW spoke with pt's RN she has reported pt is able to ambulate without assistance in the hallway, pt will have to d/c back to his independent living facility.    Valentina Shaggy.Shae Hinnenkamp, MSW, LCSWA [facility]  Transitions of Care Clinical Social Worker I Direct Dial: [phone]  Fax: [phone] [email or web address]

## 2021-11-22 NOTE — ED Notes (Signed)
Independent living facility at [facility] contacted and made aware that patient will be returning today.
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
## 2021-11-22 NOTE — ED Notes (Signed)
Patient visualized independently ambulating to the bathroom with a steady gate.

## 2021-11-22 NOTE — Consult Note (Signed)
[facility] Psych ED Discharge  11/22/2021 10:37 AM Kerry [name]  MRN:  [id number]  Method of visit?: Face to Face   Principal Problem: Aggressive behavior Discharge Diagnoses: Principal Problem:   Aggressive behavior   Subjective: Kerry [name] reported " I am feeling fine other than this food is horrible."  [name] 76 year old male who represented to the emergency department due to aggressive behavior.  Patient was seen and evaluated face-to-face.  During this assessment he presents slightly irritable but cooperative.  He is denying suicidal or homicidal ideations.  Denies auditory visual hallucinations.  States he is ready to get back home.  Unable to report medications that he is currently prescribed.  Patient currently resides at [facility] nursing care facility.  No documented aggressive behaviors noted while in the emergency department.  Patient to keep all outpatient follow-up appointments with neurology and psychiatry.  Case staffed with attending psychiatrist MD Lucianne Muss.  Support, encouragement and reassurance was provided.  Per admission assessment note:" Patient BIB [facility] from [facility]. York Spaniel he has been walking around too much, which is why both of his feet and ankles hurt/ are sore. In triage, patient walked to the bathroom. History of subarachnoid head bleed." Charted history with "bipolar disorder, seizure disorder, traumatic subarachnoid hemorrhage, PTSD, chronic anxiety/depression, tobacco use disorder, hyperlipidemia, obstructive sleep apnea."      Total Time spent with patient: 15 minutes  Past Psychiatric History:   Past Medical History:  Past Medical History:  Diagnosis Date   anxiety    Anxiety    Bipolar 1 disorder (HCC)    Sleep apnea     Past Surgical History:  Procedure Laterality Date   BILIARY BRUSHING  10/23/2021   Procedure: BILIARY BRUSHING;  Surgeon: Lemar Lofty., MD;  Location: [facility] ENDOSCOPY;  Service: Gastroenterology;;   BILIARY  STENT PLACEMENT  10/23/2021   Procedure: BILIARY STENT PLACEMENT;  Surgeon: Lemar Lofty., MD;  Location: [facility] ENDOSCOPY;  Service: Gastroenterology;;   BIOPSY  10/23/2021   Procedure: BIOPSY;  Surgeon: Lemar Lofty., MD;  Location: [facility] ENDOSCOPY;  Service: Gastroenterology;;   ERCP N/A 10/23/2021   Procedure: ENDOSCOPIC RETROGRADE CHOLANGIOPANCREATOGRAPHY (ERCP);  Surgeon: Lemar Lofty., MD;  Location: [facility] ENDOSCOPY;  Service: Gastroenterology;  Laterality: N/A;   ESOPHAGOGASTRODUODENOSCOPY (EGD) WITH PROPOFOL N/A 10/23/2021   Procedure: ESOPHAGOGASTRODUODENOSCOPY (EGD) WITH PROPOFOL;  Surgeon: Meridee Score Netty Starring., MD;  Location: [facility] ENDOSCOPY;  Service: Gastroenterology;  Laterality: N/A;   FINE NEEDLE ASPIRATION  10/23/2021   Procedure: FINE NEEDLE ASPIRATION (FNA) LINEAR;  Surgeon: Lemar Lofty., MD;  Location: [facility] ENDOSCOPY;  Service: Gastroenterology;;   REMOVAL OF STONES  10/23/2021   Procedure: REMOVAL OF SLUDGE;  Surgeon: Lemar Lofty., MD;  Location: [facility] ENDOSCOPY;  Service: Gastroenterology;;   [name]  10/23/2021   Procedure: [name];  Surgeon: Lemar Lofty., MD;  Location: [facility] ENDOSCOPY;  Service: Gastroenterology;;   UPPER ESOPHAGEAL ENDOSCOPIC ULTRASOUND (EUS) N/A 10/23/2021   Procedure: UPPER ESOPHAGEAL ENDOSCOPIC ULTRASOUND (EUS);  Surgeon: Lemar Lofty., MD;  Location: [facility] ENDOSCOPY;  Service: Gastroenterology;  Laterality: N/A;   Family History:  Family History  Problem Relation Age of Onset   Heart disease Neg Hx    Family Psychiatric  History:  Social History:  Social History   Substance and Sexual Activity  Alcohol Use Never     Social History   Substance and Sexual Activity  Drug Use Never    Social History   Socioeconomic History   Marital status: Married  Spouse name: Not on file   Number of children: Not on file   Years of education: Not on file   Highest education level: Not on file   Occupational History   Not on file  Tobacco Use   Smoking status: Every Day    Types: Cigarettes   Smokeless tobacco: Never  Vaping Use   Vaping Use: Never used  Substance and Sexual Activity   Alcohol use: Never   Drug use: Never   Sexual activity: Not on file  Other Topics Concern   Not on file  Social History Narrative   Not on file   Social Determinants of Health   Financial Resource Strain: Not on file  Food Insecurity: Not on file  Transportation Needs: Not on file  Physical Activity: Not on file  Stress: Not on file  Social Connections: Not on file    Tobacco Cessation:  N/A, patient does not currently use tobacco products  Current Medications: Current Facility-Administered Medications  Medication Dose Route Frequency Provider Last Rate Last Admin   ascorbic acid (VITAMIN C) tablet 500 mg  500 mg Oral Daily Roxy Horseman, PA-C   500 mg at 11/22/21 6979   aspirin EC tablet 81 mg  81 mg Oral Daily Roxy Horseman, PA-C   81 mg at 11/22/21 4801   cholecalciferol (VITAMIN D3) tablet 2,000 Units  2,000 Units Oral QHS Roxy Horseman, PA-C       donepezil (ARICEPT) tablet 5 mg  5 mg Oral q morning Roxy Horseman, PA-C   5 mg at 11/22/21 0932   escitalopram (LEXAPRO) tablet 5 mg  5 mg Oral Daily Roxy Horseman, PA-C   5 mg at 11/22/21 6553   ferrous gluconate (FERGON) tablet 324 mg  324 mg Oral Daily Roxy Horseman, PA-C   324 mg at 11/22/21 0932   finasteride (PROSCAR) tablet 5 mg  5 mg Oral Daily Roxy Horseman, PA-C   5 mg at 11/22/21 0931   levETIRAcetam (KEPPRA) tablet 1,500 mg  1,500 mg Oral Daily Roxy Horseman, PA-C   1,500 mg at 11/22/21 7482   melatonin tablet 3 mg  3 mg Oral QHS Roxy Horseman, PA-C       nicotine (NICODERM CQ - dosed in mg/24 hours) patch 14 mg  14 mg Transdermal Daily Roxy Horseman, PA-C   14 mg at 11/22/21 0933   pantoprazole (PROTONIX) EC tablet 40 mg  40 mg Oral Daily Roxy Horseman, PA-C   40 mg at 11/22/21 0932    QUEtiapine (SEROQUEL) tablet 100 mg  100 mg Oral QHS Roxy Horseman, PA-C       rOPINIRole (REQUIP) tablet 1 mg  1 mg Oral QHS Roxy Horseman, PA-C       rosuvastatin (CRESTOR) tablet 5 mg  5 mg Oral QHS Roxy Horseman, PA-C       Current Outpatient Medications  Medication Sig Dispense Refill   aspirin EC 81 MG tablet Take 1 tablet (81 mg total) by mouth daily. 30 tablet 12   Cholecalciferol (VITAMIN D) 50 MCG (2000 UT) tablet Take 2,000 Units by mouth at bedtime.     clotrimazole (LOTRIMIN) 1 % external solution Apply 1 application. topically See admin instructions. Apply small amount daily as needed for dystrophic nails     donepezil (ARICEPT) 5 MG tablet Take 5 mg by mouth every morning. For memory     Emollient (CETAPHIL) cream Apply 1 application. topically daily.     escitalopram (LEXAPRO) 10 MG tablet Take 5 mg by mouth  daily. For mental health     ferrous gluconate (FERGON) 324 MG tablet Take 324 mg by mouth daily. Take with Vitamin C/ascorbic acid     finasteride (PROSCAR) 5 MG tablet Take 5 mg by mouth daily. For overactive bladder     levETIRAcetam (KEPPRA) 750 MG tablet Take 2 tablets (1,500 mg total) by mouth 2 (two) times daily. (Patient taking differently: Take 1,500 mg by mouth daily.) 60 tablet 5   melatonin 3 MG TABS tablet Take 3 mg by mouth at bedtime.     nicotine (NICODERM CQ - DOSED IN MG/24 HOURS) 14 mg/24hr patch Place 14 mg onto the skin daily.     pantoprazole (PROTONIX) 40 MG tablet Take 1 tablet (40 mg total) by mouth daily. 30 tablet 1   QUEtiapine (SEROQUEL) 100 MG tablet Take 100 mg by mouth at bedtime. For mood and sleep     rOPINIRole (REQUIP) 1 MG tablet Take 1 mg by mouth at bedtime. For restless leg syndrome     rosuvastatin (CRESTOR) 10 MG tablet Take 0.5 tablets (5 mg total) by mouth at bedtime. For cholesterol     sildenafil (VIAGRA) 100 MG tablet Take 100 mg by mouth daily as needed for erectile dysfunction (one hour prior to sexual activity (do not  exceed 1 dose in 24 hour period)).     traZODone (DESYREL) 150 MG tablet Take 75 mg by mouth at bedtime as needed for sleep.     vitamin C (ASCORBIC ACID) 500 MG tablet Take 500 mg by mouth daily.     zolpidem (AMBIEN) 10 MG tablet Take 5 mg by mouth at bedtime as needed for sleep.     divalproex (DEPAKOTE ER) 500 MG 24 hr tablet Take 2 tablets (1,000 mg total) by mouth at bedtime. (Patient not taking: Reported on 11/22/2021) 30 tablet 5   PTA Medications: (Not in a hospital admission)   Musculoskeletal: Strength & Muscle Tone: within normal limits Gait & Station: normal Patient leans: N/A  Psychiatric Specialty Exam:  Presentation  General Appearance: Appropriate for Environment  Eye Contact:Good  Speech:Clear and Coherent  Speech Volume:Normal  Handedness:Right   Mood and Affect  Mood:No data recorded Affect:Congruent   Thought Process  Thought Processes:Coherent  Descriptions of Associations:Intact  Orientation:Full (Time, Place and Person)  Thought Content:Logical  History of Schizophrenia/Schizoaffective disorder:No data recorded Duration of Psychotic Symptoms:No data recorded Hallucinations:Hallucinations: None  Ideas of Reference:None  Suicidal Thoughts:Suicidal Thoughts: No  Homicidal Thoughts:Homicidal Thoughts: No   Sensorium  Memory:Immediate Fair; Recent Fair  Judgment:Fair  Insight:Fair   Executive Functions  Concentration:Good  Attention Span:Fair  Recall:No data recorded Fund of Knowledge:Good  Language:Good   Psychomotor Activity  Psychomotor Activity:Psychomotor Activity: Normal   Assets  Assets:Social Support   Sleep  Sleep:Sleep: Fair    Physical Exam: Physical Exam Vitals reviewed.  Cardiovascular:     Rate and Rhythm: Normal rate.  Neurological:     Mental Status: He is alert and oriented to person, place, and time.  Psychiatric:        Mood and Affect: Mood normal.    Review of Systems   Psychiatric/Behavioral:  Negative for depression and suicidal ideas. The patient is nervous/anxious.   All other systems reviewed and are negative.  Blood pressure 102/67, pulse 64, temperature 98.2 F (36.8 C), temperature source Oral, resp. rate 18, height 5\' 6"  (1.676 m), weight 71.7 kg, SpO2 100 %. Body mass index is 25.5 kg/m.   Demographic Factors:  Male and Age 43  or older  Loss Factors: NA  Historical Factors: NA  Risk Reduction Factors:   NA  Continued Clinical Symptoms:  Bipolar Disorder:   Depressive phase  Cognitive Features That Contribute To Risk:  Closed-mindedness    Suicide Risk:  Minimal: No identifiable suicidal ideation.  Patients presenting with no risk factors but with morbid ruminations; may be classified as minimal risk based on the severity of the depressive symptoms   Follow-up Information     Clinic, [facility]   Contact information: [address] [facility] [place] [address] [phone]                 Plan Of Care/Follow-up recommendations:  Activity:  as tolerated Diet:  heart healthy   Disposition: Take all medications as prescribed. Keep all follow-up appointments as scheduled.  Do not consume alcohol or use illegal drugs while on prescription medications. Report any adverse effects from your medications to your primary care provider promptly.  In the event of recurrent symptoms or worsening symptoms, call 911, a crisis hotline, or go to the nearest emergency department for evaluation.     Oneta Rack, NP 11/22/2021, 10:37 AM
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
## 2021-11-22 NOTE — Discharge Instructions (Signed)
Your emergency department workup showed no serious findings. Please follow-up with your doctor.

## 2021-11-22 NOTE — BH Assessment (Signed)
[facility] Assessment Progress Note   Per Hillery Jacks, NP, this voluntary pt does not require psychiatric hospitalization at this time.  Pt is psychiatrically cleared.  Discharge instructions advise pt to follow up with the [facility].  EDP Kristine Royal, MD and pt's nurse, Dahlia Client, have been notified.  Doylene Canning, MA Triage Specialist [phone]

## 2021-11-22 NOTE — ED Notes (Signed)
[facility] called for transportation  

## 2021-11-22 NOTE — ED Notes (Signed)
Attempted to call report at [facility]. Was transferred to pt's hall by secretary, was directed to voicemail, left message in regards to call back and provided callback number [phone].

## 2021-11-22 NOTE — Discharge Instructions (Addendum)
Return for any problem.   For your behavioral health needs you are advised to follow up with the [facility] at your earliest opportunity:       [facility]      [address] [place], [place] [address]      [phone]

## 2021-11-22 NOTE — Telephone Encounter (Signed)
I spoke with Kerry [name] at [facility] and she has been advised that the office visit was cancelled but he is set up for procedure on 8/1.  I have faxed the instructions to her at [phone].  All questions answered to the best of my ability.
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
## 2021-11-22 NOTE — ED Notes (Signed)
Pt has been polite with staff, no aggressive behavior noted.

## 2021-11-29 ENCOUNTER — Inpatient Hospital Stay: Payer: Medicare Other | Admitting: Neurology

## 2021-11-29 ENCOUNTER — Encounter: Payer: Self-pay | Admitting: Neurology

## 2021-12-12 ENCOUNTER — Encounter (HOSPITAL_COMMUNITY): Payer: Self-pay | Admitting: Gastroenterology

## 2021-12-18 ENCOUNTER — Encounter (HOSPITAL_COMMUNITY): Payer: Self-pay | Admitting: Anesthesiology

## 2021-12-18 NOTE — Anesthesia Preprocedure Evaluation (Deleted)
Anesthesia Evaluation    Reviewed: Allergy & Precautions, Patient's Chart, lab work & pertinent test results  Airway        Dental   Pulmonary sleep apnea , Current Smoker,           Cardiovascular      Neuro/Psych Seizures -,  PSYCHIATRIC DISORDERS Bipolar Disorder    GI/Hepatic negative GI ROS, Neg liver ROS, Lab Results      Component                Value               Date                      ALT                      13                  11/21/2021                AST                      18                  11/21/2021                ALKPHOS                  55                  11/21/2021                BILITOT                  0.9                 11/21/2021              Endo/Other    Renal/GU Renal diseaseLab Results      Component                Value               Date                      CREATININE               0.73                11/21/2021                 K                        3.5                 11/21/2021                    Musculoskeletal negative musculoskeletal ROS (+)   Abdominal   Peds  Hematology  (+) Blood dyscrasia, anemia , Lab Results      Component                Value               Date                      WBC  6.4                 11/21/2021                HGB                      10.0 (L)            11/21/2021                HCT                      30.5 (L)            11/21/2021                MCV                      92.4                11/21/2021                PLT                      210                 11/21/2021               Anesthesia Other Findings   Reproductive/Obstetrics                             Anesthesia Physical Anesthesia Plan  ASA: 3  Anesthesia Plan: General   Post-op Pain Management:    Induction:   PONV Risk Score and Plan: 2 and Treatment may vary due to age or medical condition  Airway Management  Planned: Oral ETT  Additional Equipment: None  Intra-op Plan:   Post-operative Plan: Extubation in OR  Informed Consent:     Dental advisory given  Plan Discussed with:   Anesthesia Plan Comments: (Obstructive jaundice for EGD wEUS)       Anesthesia Quick Evaluation

## 2021-12-19 ENCOUNTER — Telehealth: Payer: Self-pay

## 2021-12-19 ENCOUNTER — Ambulatory Visit (HOSPITAL_COMMUNITY)
Admission: RE | Admit: 2021-12-19 | Payer: No Typology Code available for payment source | Source: Home / Self Care | Admitting: Gastroenterology

## 2021-12-19 ENCOUNTER — Encounter (HOSPITAL_COMMUNITY): Admission: RE | Payer: Self-pay | Source: Home / Self Care

## 2021-12-19 SURGERY — ESOPHAGOGASTRODUODENOSCOPY (EGD) WITH PROPOFOL
Anesthesia: Monitor Anesthesia Care

## 2021-12-19 NOTE — Progress Notes (Signed)
Pt no show day of procedure. Multiple attempts by multiple people to reach patient and or brother. NO answer or response to message.

## 2021-12-19 NOTE — Telephone Encounter (Signed)
-----   Message from Lemar Lofty., MD sent at 12/19/2021  4:30 PM EDT ----- Regarding: RE: Follow-up Rekita Miotke, We need to find some way of getting this patient and his family to a clinic visit with [place] so that we can confirm everything and go over everything with them and make sure that we have prior authorization for that.  He still on my follow-up stent list but lets focus on getting him a clinic visit so that we can confirm things for the future.  Thanks. GM ----- Message ----- From: Loretha Stapler, RN Sent: 12/18/2021   4:23 PM EDT To: Lemar Lofty., MD Subject: RE: Follow-up                                  No our pre cert team handles the PA even the hospital cases.  ----- Message ----- From: Lemar Lofty., MD Sent: 12/18/2021   4:13 PM EDT To: Loretha Stapler, RN Subject: RE: Follow-up                                  Shouldn't the prior authorization have been done by [facility] already since it was hospital based? ----- Message ----- From: Loretha Stapler, RN Sent: 12/18/2021   4:02 PM EDT To: Lemar Lofty., MD Subject: RE: Follow-up                                  I have called and left several messages with no response.  We are also trying to get the PA from the [place].  I have called the facility and was told he is in assisted living and has no nurse that he needs to be contacted directly.  I have also left messages for his EC (brother) with no response.  ----- Message ----- From: Lemar Lofty., MD Sent: 12/18/2021   2:50 PM EDT To: Loretha Stapler, RN Subject: Follow-up                                      Alistair Senft, Can you reach out to the patient's facility today to make sure everything is set for him to show up tomorrow? I have so many inpatients that need to get done that if there is any chance he is not going to show up or be brought in then I need to know that. Thanks. GM

## 2021-12-20 NOTE — Telephone Encounter (Signed)
I have left a message for someone to call me from the [place] to discuss. I have also faxed a letter to the [place] letting them know we have not been able to reach the pt.

## 2021-12-21 NOTE — Telephone Encounter (Signed)
Thank you for the hard work with this patient Patty. GM

## 2022-01-10 ENCOUNTER — Ambulatory Visit: Payer: No Typology Code available for payment source | Admitting: Gastroenterology

## 2022-07-27 ENCOUNTER — Telehealth: Payer: Self-pay | Admitting: Radiation Oncology

## 2022-07-27 NOTE — Telephone Encounter (Signed)
LVM to schedule CON with Dr. Lisbeth Renshaw. [facility] referral and auth received 07/27/22

## 2022-07-30 ENCOUNTER — Telehealth: Payer: Self-pay | Admitting: Hematology

## 2022-07-30 NOTE — Telephone Encounter (Signed)
scheduled per 3/6 referral , pt has been called and confirmed date and time. Pt is aware of location and to arrive early for check in   

## 2022-07-31 ENCOUNTER — Telehealth: Payer: Self-pay | Admitting: Radiation Oncology

## 2022-07-31 NOTE — Progress Notes (Signed)
I received an email from [facility], RN from the [place] "Good morning [name],  You had previously emailed me about Kerry, [name] [id number] [birth year].  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  He was originally dx at [facility].  [facility] has decided to transfer all of his care back to [facility].  Please disregard the referrals sent by the [facility] for Medical Oncology and Radiation Oncology.  Thanks!  Respectfully,   Glynn Octave BSN, Research scientist (physical sciences) - Hematology/Oncology, Radiation Oncology, Surgical Oncology, IR, & [place] in the [place]) [facility] [address]  [place] [address]"  I have cancelled his appts

## 2022-07-31 NOTE — Telephone Encounter (Signed)
LVM to schedule CON with Dr. Moody 

## 2022-07-31 NOTE — Telephone Encounter (Signed)
3/12 @ 9:20 am [facility] sent a referral for Veteran: Standly, Shames [id number] 1945/06/29.   Please disregard this referral for Radiation Oncology.  He is going to be receiving his care with [facility] since that is where his surgical oncologist and medical oncologist will be. - per Glynn Octave, Nurse Navigator, [facility]

## 2022-08-02 ENCOUNTER — Inpatient Hospital Stay: Payer: No Typology Code available for payment source

## 2022-08-02 ENCOUNTER — Inpatient Hospital Stay: Payer: No Typology Code available for payment source | Admitting: Hematology

## 2022-08-09 DIAGNOSIS — C259 Malignant neoplasm of pancreas, unspecified: Secondary | ICD-10-CM | POA: Diagnosis present

## 2022-12-07 ENCOUNTER — Emergency Department (HOSPITAL_COMMUNITY): Payer: No Typology Code available for payment source

## 2022-12-07 ENCOUNTER — Inpatient Hospital Stay (HOSPITAL_COMMUNITY)
Admission: EM | Admit: 2022-12-07 | Discharge: 2022-12-09 | DRG: 101 | Disposition: A | Discharge status: Home Health | Payer: No Typology Code available for payment source | Source: Skilled Nursing Facility | Attending: Internal Medicine | Admitting: Internal Medicine

## 2022-12-07 ENCOUNTER — Other Ambulatory Visit: Payer: Self-pay

## 2022-12-07 DIAGNOSIS — G40909 Epilepsy, unspecified, not intractable, without status epilepticus: Principal | ICD-10-CM

## 2022-12-07 DIAGNOSIS — F319 Bipolar disorder, unspecified: Secondary | ICD-10-CM | POA: Diagnosis present

## 2022-12-07 DIAGNOSIS — R410 Disorientation, unspecified: Secondary | ICD-10-CM | POA: Diagnosis not present

## 2022-12-07 DIAGNOSIS — C259 Malignant neoplasm of pancreas, unspecified: Secondary | ICD-10-CM | POA: Diagnosis present

## 2022-12-07 DIAGNOSIS — F17210 Nicotine dependence, cigarettes, uncomplicated: Secondary | ICD-10-CM | POA: Diagnosis present

## 2022-12-07 DIAGNOSIS — Z7982 Long term (current) use of aspirin: Secondary | ICD-10-CM

## 2022-12-07 DIAGNOSIS — Z888 Allergy status to other drugs, medicaments and biological substances status: Secondary | ICD-10-CM

## 2022-12-07 DIAGNOSIS — N3000 Acute cystitis without hematuria: Secondary | ICD-10-CM | POA: Diagnosis not present

## 2022-12-07 DIAGNOSIS — Z79899 Other long term (current) drug therapy: Secondary | ICD-10-CM

## 2022-12-07 DIAGNOSIS — Z91148 Patient's other noncompliance with medication regimen for other reason: Secondary | ICD-10-CM | POA: Diagnosis not present

## 2022-12-07 DIAGNOSIS — R451 Restlessness and agitation: Secondary | ICD-10-CM | POA: Diagnosis present

## 2022-12-07 DIAGNOSIS — W19XXXA Unspecified fall, initial encounter: Principal | ICD-10-CM

## 2022-12-07 DIAGNOSIS — R569 Unspecified convulsions: Secondary | ICD-10-CM

## 2022-12-07 DIAGNOSIS — E876 Hypokalemia: Secondary | ICD-10-CM | POA: Diagnosis present

## 2022-12-07 DIAGNOSIS — F0394 Unspecified dementia, unspecified severity, with anxiety: Secondary | ICD-10-CM | POA: Diagnosis present

## 2022-12-07 DIAGNOSIS — F0393 Unspecified dementia, unspecified severity, with mood disturbance: Secondary | ICD-10-CM | POA: Diagnosis present

## 2022-12-07 DIAGNOSIS — G4733 Obstructive sleep apnea (adult) (pediatric): Secondary | ICD-10-CM | POA: Diagnosis present

## 2022-12-07 DIAGNOSIS — R55 Syncope and collapse: Secondary | ICD-10-CM | POA: Diagnosis present

## 2022-12-07 DIAGNOSIS — N3281 Overactive bladder: Secondary | ICD-10-CM | POA: Diagnosis present

## 2022-12-07 DIAGNOSIS — N390 Urinary tract infection, site not specified: Secondary | ICD-10-CM | POA: Diagnosis present

## 2022-12-07 DIAGNOSIS — S022XXA Fracture of nasal bones, initial encounter for closed fracture: Secondary | ICD-10-CM | POA: Diagnosis present

## 2022-12-07 DIAGNOSIS — W1830XA Fall on same level, unspecified, initial encounter: Secondary | ICD-10-CM | POA: Diagnosis present

## 2022-12-07 DIAGNOSIS — Z79890 Hormone replacement therapy (postmenopausal): Secondary | ICD-10-CM | POA: Diagnosis not present

## 2022-12-07 DIAGNOSIS — G9341 Metabolic encephalopathy: Secondary | ICD-10-CM | POA: Diagnosis not present

## 2022-12-07 LAB — URINALYSIS, W/ REFLEX TO CULTURE (INFECTION SUSPECTED)
Bilirubin Urine: NEGATIVE
Glucose, UA: NEGATIVE mg/dL
Hgb urine dipstick: NEGATIVE
Ketones, ur: 20 mg/dL — AB
Nitrite: NEGATIVE
Protein, ur: 30 mg/dL — AB
Specific Gravity, Urine: 1.017 (ref 1.005–1.030)
pH: 5 (ref 5.0–8.0)

## 2022-12-07 LAB — RAPID URINE DRUG SCREEN, HOSP PERFORMED
Amphetamines: NOT DETECTED
Barbiturates: NOT DETECTED
Benzodiazepines: NOT DETECTED
Cocaine: NOT DETECTED
Opiates: NOT DETECTED
Tetrahydrocannabinol: NOT DETECTED

## 2022-12-07 LAB — I-STAT CHEM 8, ED
BUN: 8 mg/dL (ref 8–23)
Calcium, Ion: 1.19 mmol/L (ref 1.15–1.40)
Chloride: 103 mmol/L (ref 98–111)
Creatinine, Ser: 0.7 mg/dL (ref 0.61–1.24)
Glucose, Bld: 109 mg/dL — ABNORMAL HIGH (ref 70–99)
HCT: 35 % — ABNORMAL LOW (ref 39.0–52.0)
Hemoglobin: 11.9 g/dL — ABNORMAL LOW (ref 13.0–17.0)
Potassium: 3.5 mmol/L (ref 3.5–5.1)
Sodium: 139 mmol/L (ref 135–145)
TCO2: 23 mmol/L (ref 22–32)

## 2022-12-07 LAB — CBC WITH DIFFERENTIAL/PLATELET
Abs Immature Granulocytes: 0.03 10*3/uL (ref 0.00–0.07)
Basophils Absolute: 0 10*3/uL (ref 0.0–0.1)
Basophils Relative: 0 %
Eosinophils Absolute: 0 10*3/uL (ref 0.0–0.5)
Eosinophils Relative: 0 %
HCT: 34.5 % — ABNORMAL LOW (ref 39.0–52.0)
Hemoglobin: 11.6 g/dL — ABNORMAL LOW (ref 13.0–17.0)
Immature Granulocytes: 1 %
Lymphocytes Relative: 14 %
Lymphs Abs: 0.9 10*3/uL (ref 0.7–4.0)
MCH: 30.8 pg (ref 26.0–34.0)
MCHC: 33.6 g/dL (ref 30.0–36.0)
MCV: 91.5 fL (ref 80.0–100.0)
Monocytes Absolute: 0.7 10*3/uL (ref 0.1–1.0)
Monocytes Relative: 11 %
Neutro Abs: 4.9 10*3/uL (ref 1.7–7.7)
Neutrophils Relative %: 74 %
Platelets: 108 10*3/uL — ABNORMAL LOW (ref 150–400)
RBC: 3.77 MIL/uL — ABNORMAL LOW (ref 4.22–5.81)
RDW: 15.4 % (ref 11.5–15.5)
WBC: 6.6 10*3/uL (ref 4.0–10.5)
nRBC: 0 % (ref 0.0–0.2)

## 2022-12-07 LAB — HEPATIC FUNCTION PANEL
ALT: 30 U/L (ref 0–44)
AST: 27 U/L (ref 15–41)
Albumin: 3.9 g/dL (ref 3.5–5.0)
Alkaline Phosphatase: 64 U/L (ref 38–126)
Bilirubin, Direct: <0.1 mg/dL (ref 0.0–0.2)
Total Bilirubin: 0.4 mg/dL (ref 0.3–1.2)
Total Protein: 7.2 g/dL (ref 6.5–8.1)

## 2022-12-07 LAB — ACETAMINOPHEN LEVEL: Acetaminophen (Tylenol), Serum: <10 ug/mL — ABNORMAL LOW (ref 10–30)

## 2022-12-07 LAB — VALPROIC ACID LEVEL: Valproic Acid Lvl: 32 ug/mL — ABNORMAL LOW (ref 50.0–100.0)

## 2022-12-07 LAB — SALICYLATE LEVEL: Salicylate Lvl: <7 mg/dL — ABNORMAL LOW (ref 7.0–30.0)

## 2022-12-07 LAB — AMMONIA: Ammonia: 31 umol/L (ref 9–35)

## 2022-12-07 LAB — LIPASE, BLOOD: Lipase: 27 U/L (ref 11–51)

## 2022-12-07 LAB — ETHANOL: Alcohol, Ethyl (B): <10 mg/dL (ref <10)

## 2022-12-07 MED ORDER — ZIPRASIDONE MESYLATE 20 MG IM SOLR: 10.0000 mg | Freq: Once | INTRAMUSCULAR | Status: DC

## 2022-12-07 MED ORDER — LEVETIRACETAM IN NACL 1500 MG/100ML IV SOLN
1500.0000 mg | Freq: Once | INTRAVENOUS | Status: AC
  Administered 2022-12-07: 1500 mg via INTRAVENOUS
  Filled 2022-12-07: qty 100

## 2022-12-07 MED ORDER — HALOPERIDOL LACTATE 5 MG/ML IJ SOLN
5.0000 mg | Freq: Once | INTRAMUSCULAR | Status: AC
  Administered 2022-12-07: 5 mg via INTRAVENOUS
  Filled 2022-12-07: qty 1

## 2022-12-07 MED ORDER — NICOTINE 21 MG/24HR TD PT24
21.0000 mg | MEDICATED_PATCH | Freq: Once | TRANSDERMAL | Status: AC
  Administered 2022-12-07: 21 mg via TRANSDERMAL
  Filled 2022-12-07: qty 1

## 2022-12-07 MED ORDER — LORAZEPAM 2 MG/ML IJ SOLN: 2.0000 mg | Freq: Once | INTRAMUSCULAR | Status: AC

## 2022-12-07 MED ORDER — LORAZEPAM 2 MG/ML IJ SOLN
INTRAMUSCULAR | Status: AC
  Administered 2022-12-07: 2 mg via INTRAVENOUS
  Filled 2022-12-07: qty 1

## 2022-12-07 NOTE — ED Notes (Signed)
Pt resting reported to nurse said hold off on pt temperature.

## 2022-12-07 NOTE — ED Provider Notes (Signed)
[facility] EMERGENCY DEPARTMENT AT [facility] Provider Note  MDM   HPI/ROS:  Kerry [name] is a 77 y.o. male with a medical history as below who presents for evaluation of fall and confusion.  Per EMS he has been having increased confusion for the last week.  Today he was very confused with caregiver who called him later in the day to see how he is feeling.  When he did not answer the phone, they went to check on him and found him on the floor.  Does have a recent diagnosis of pancreatic cancer.  On exam he is GCS 14 (4,4,6) but ANO x 4.  He appears to be delirious and is having delusions of people stealing his money.  He is unable to meaningfully contribute to his current presentation.  He does report that he believes he has had 3-4 strokes today but is unable to elaborate further.  He asked me to call the police to find out where his money had gone.  Physical exam is notable for: - Normal neurologic exam without focal deficit - Delusions  On my initial evaluation, patient is:  -Vital signs stable. Patient afebrile, hemodynamically stable, and non-toxic appearing. -Additional history obtained from chart review  Patient with a seizure history presents with a ground-level fall that was unwitnessed.  He does appear altered and reportedly has had an increase in his confusion over the last week.  He does have a history of pancreatic cancer and I am concerned about metastasis.  He also has a psychiatric history and per chart review has not been compliant with his medication.  This is likely also contributing.  Will pursue a broad workup including labs for altered mental status, CT head and C-spine, chest x-ray, serial reevaluation.  Interpretations, interventions, and the patient's course of care are documented below.    Clinical Course as of 12/07/22 2214  Fri Dec 07, 2022  1941 Called urgently to bedside for seizure like activity.  Patient appeared to be exhibiting generalized  seizure activity.  He did reportedly have a desaturation event down to the mid 80s.  Was on a nonrebreather and satting at 100%.  Seizure activity lasted approximately 60 to 90 seconds.  He was confused and appeared postictal afterwards.  He was given 2 of Ativan IV during this event. [BB]  2114 Spoke with neurology who recommended routine EEG and MRI with admission to hospital for further  [BB]    Clinical Course User Index [BB] Fayrene Helper, MD     Disposition:  I discussed the case with hospitalist who graciously agreed to admit the patient to their service for continued care.   Clinical Impression:  1. Fall, initial encounter   2. Confusion   3. Seizure (HCC)     Rx / DC Orders ED Discharge Orders     None       The plan for this patient was discussed with Dr. Dalene Seltzer, who voiced agreement and who oversaw evaluation and treatment of this patient.   Clinical Complexity A medically appropriate history, review of systems, and physical exam was performed.  My independent interpretations of EKG, labs, and radiology are documented in the ED course above.   If decision rules were used in this patient's evaluation, they are listed below.   Click here for ABCD2, HEART and other calculatorsREFRESH Note before signing   Patient's presentation is most consistent with acute presentation with potential threat to life or bodily function.  Medical Decision Making Amount  and/or Complexity of Data Reviewed Labs: ordered. Radiology: ordered.  Risk OTC drugs. Prescription drug management. Decision regarding hospitalization.    HPI/ROS      See MDM section for pertinent HPI and ROS. A complete ROS was performed with pertinent positives/negatives noted above.   Past Medical History:  Diagnosis Date   anxiety    Anxiety    Bipolar 1 disorder (HCC)    Sleep apnea     Past Surgical History:  Procedure Laterality Date   BILIARY BRUSHING  10/23/2021   Procedure: BILIARY  BRUSHING;  Surgeon: Meridee Score Netty Starring., MD;  Location: [facility] ENDOSCOPY;  Service: Gastroenterology;;   BILIARY STENT PLACEMENT  10/23/2021   Procedure: BILIARY STENT PLACEMENT;  Surgeon: Lemar Lofty., MD;  Location: [facility] ENDOSCOPY;  Service: Gastroenterology;;   BIOPSY  10/23/2021   Procedure: BIOPSY;  Surgeon: Lemar Lofty., MD;  Location: [facility] ENDOSCOPY;  Service: Gastroenterology;;   ERCP N/A 10/23/2021   Procedure: ENDOSCOPIC RETROGRADE CHOLANGIOPANCREATOGRAPHY (ERCP);  Surgeon: Lemar Lofty., MD;  Location: [facility] ENDOSCOPY;  Service: Gastroenterology;  Laterality: N/A;   ESOPHAGOGASTRODUODENOSCOPY (EGD) WITH PROPOFOL N/A 10/23/2021   Procedure: ESOPHAGOGASTRODUODENOSCOPY (EGD) WITH PROPOFOL;  Surgeon: Meridee Score Netty Starring., MD;  Location: [facility] ENDOSCOPY;  Service: Gastroenterology;  Laterality: N/A;   FINE NEEDLE ASPIRATION  10/23/2021   Procedure: FINE NEEDLE ASPIRATION (FNA) LINEAR;  Surgeon: Lemar Lofty., MD;  Location: [facility] ENDOSCOPY;  Service: Gastroenterology;;   REMOVAL OF STONES  10/23/2021   Procedure: REMOVAL OF SLUDGE;  Surgeon: Lemar Lofty., MD;  Location: [facility] ENDOSCOPY;  Service: Gastroenterology;;   [name]  10/23/2021   Procedure: [name];  Surgeon: Lemar Lofty., MD;  Location: [facility] ENDOSCOPY;  Service: Gastroenterology;;   UPPER ESOPHAGEAL ENDOSCOPIC ULTRASOUND (EUS) N/A 10/23/2021   Procedure: UPPER ESOPHAGEAL ENDOSCOPIC ULTRASOUND (EUS);  Surgeon: Lemar Lofty., MD;  Location: [facility] ENDOSCOPY;  Service: Gastroenterology;  Laterality: N/A;      Physical Exam   Vitals:   12/07/22 2019 12/07/22 2030 12/07/22 2100 12/07/22 2200  BP:  (!) 112/56 (!) 106/52 (!) 116/46  Pulse:  73 73 69  Resp: 20 16 17 15   Temp:      TempSrc:      SpO2:  99% 97% 99%  Weight:      Height:        Physical Exam Vitals and nursing note reviewed.  Constitutional:      General: He is not in acute distress.    Appearance: He is  well-developed.  HENT:     Head: Normocephalic.      Nose: Nasal deformity, signs of injury and nasal tenderness present.     Right Nostril: No septal hematoma.     Left Nostril: No septal hematoma.     Mouth/Throat:     Mouth: Mucous membranes are moist.  Eyes:     Extraocular Movements: Extraocular movements intact.     Conjunctiva/sclera: Conjunctivae normal.     Pupils: Pupils are equal, round, and reactive to light.  Cardiovascular:     Rate and Rhythm: Normal rate and regular rhythm.     Heart sounds: No murmur heard. Pulmonary:     Effort: Pulmonary effort is normal. No respiratory distress.     Breath sounds: Normal breath sounds.  Abdominal:     General: Abdomen is flat.     Palpations: Abdomen is soft.     Tenderness: There is no abdominal tenderness. There is no guarding.  Musculoskeletal:     Right forearm: Laceration (  Skin tear) present.     Right wrist: Laceration (Skin tear) present.     Cervical back: Neck supple.  Skin:    General: Skin is warm and dry.     Capillary Refill: Capillary refill takes less than 2 seconds.  Neurological:     General: No focal deficit present.     Mental Status: He is alert and oriented to person, place, and time. He is confused.     GCS: GCS eye subscore is 4. GCS verbal subscore is 4. GCS motor subscore is 6.     Cranial Nerves: Cranial nerves 2-12 are intact. No cranial nerve deficit or facial asymmetry.     Sensory: Sensation is intact. No sensory deficit.     Motor: Motor function is intact.  Psychiatric:        Attention and Perception: He is inattentive (Does not appear to be responding to internal stimuli). He does not perceive auditory or visual hallucinations.        Mood and Affect: Mood is anxious. Affect is inappropriate.        Speech: Speech is rapid and pressured and tangential.        Behavior: Behavior is hyperactive. Behavior is not combative.        Thought Content: Thought content is delusional.       Procedures   If procedures were preformed on this patient, they are listed below:  Procedures   Fayrene Helper, MD Emergency Medicine PGY-2   Please note that this documentation was produced with the assistance of voice-to-text technology and may contain errors.    Fayrene Helper, MD 12/07/22 2956    Alvira Monday, MD 12/10/22 2155

## 2022-12-07 NOTE — ED Notes (Addendum)
ED TO INPATIENT HANDOFF REPORT  ED Nurse Name and Phone #: Merry Lofty [phone]  S Name/Age/Gender Kerry [name] 77 y.o. male Room/Bed: 023C/023C  Code Status   Code Status: Prior  Home/SNF/Other Nursing Home Patient oriented to: self, place, and time Is this baseline? No   Triage Complete: Triage complete  Chief Complaint Seizure disorder [facility]) [G40.909]  Triage Note No notes on file   Allergies Allergies  Allergen Reactions   Fluoride Other (See Comments)    Seizures and stroke after dental visit from using fluoride paste (per pt)     Level of Care/Admitting Diagnosis ED Disposition     ED Disposition  Admit   Condition  --   Comment  Hospital Area: [facility] [[id number]]  Level of Care: Progressive [102]  Admit to Progressive based on following criteria: NEUROLOGICAL AND NEUROSURGICAL complex patients with significant risk of instability, who do not meet ICU criteria, yet require close observation or frequent assessment (< / = every 2 - 4 hours) with medical / nursing intervention.  May admit patient to [name] or [facility] if equivalent level of care is available:: No  Covid Evaluation: Asymptomatic - no recent exposure (last 10 days) testing not required  Diagnosis: Seizure disorder [facility]) [[id number]]  Admitting Physician: Lurline Del [[id number]]  Attending Physician: Lurline Del [[id number]]  Certification:: I certify this patient will need inpatient services for at least 2 midnights  Estimated Length of Stay: 3          B Medical/Surgery History Past Medical History:  Diagnosis Date   anxiety    Anxiety    Bipolar 1 disorder (HCC)    Sleep apnea    Past Surgical History:  Procedure Laterality Date   BILIARY BRUSHING  10/23/2021   Procedure: BILIARY BRUSHING;  Surgeon: Lemar Lofty., MD;  Location: [facility] ENDOSCOPY;  Service: Gastroenterology;;   BILIARY STENT PLACEMENT  10/23/2021   Procedure: BILIARY STENT  PLACEMENT;  Surgeon: Lemar Lofty., MD;  Location: [facility] ENDOSCOPY;  Service: Gastroenterology;;   BIOPSY  10/23/2021   Procedure: BIOPSY;  Surgeon: Lemar Lofty., MD;  Location: [facility] ENDOSCOPY;  Service: Gastroenterology;;   ERCP N/A 10/23/2021   Procedure: ENDOSCOPIC RETROGRADE CHOLANGIOPANCREATOGRAPHY (ERCP);  Surgeon: Lemar Lofty., MD;  Location[facility] ENDOSCOPY;  Service: Gastroenterology;  Laterality: N/A;   ESOPHAGOGASTRODUODENOSCOPY (EGD) WITH PROPOFOL N/A 10/23/2021   Procedure: ESOPHAGOGASTRODUODENOSCOPY (EGD) WITH PROPOFOL;  Surgeon: Meridee Score Netty Starring., MD;  Location: [facility] ENDOSCOPY;  Service: Gastroenterology;  Laterality: N/A;   FINE NEEDLE ASPIRATION  10/23/2021   Procedure: FINE NEEDLE ASPIRATION (FNA) LINEAR;  Surgeon: Lemar Lofty., MD;  Location: [facility] ENDOSCOPY;  Service: Gastroenterology;;   REMOVAL OF STONES  10/23/2021   Procedure: REMOVAL OF SLUDGE;  Surgeon: Lemar Lofty., MD;  Location: [facility] ENDOSCOPY;  Service: Gastroenterology;;   [name]  10/23/2021   Procedure: [name];  Surgeon: Lemar Lofty., MD;  Location: [facility] ENDOSCOPY;  Service: Gastroenterology;;   UPPER ESOPHAGEAL ENDOSCOPIC ULTRASOUND (EUS) N/A 10/23/2021   Procedure: UPPER ESOPHAGEAL ENDOSCOPIC ULTRASOUND (EUS);  Surgeon: Lemar Lofty., MD;  Location: [facility] ENDOSCOPY;  Service: Gastroenterology;  Laterality: N/A;     A IV Location/Drains/Wounds Patient Lines/Drains/Airways Status     Active Line/Drains/Airways     Name Placement date Placement time Site Days   Peripheral IV 12/07/22 20 G Anterior;Left Forearm 12/07/22  1732  Forearm  less than 1   GI Stent 10/23/21  1353  --  410  Intake/Output Last 24 hours No intake or output data in the 24 hours ending 12/07/22 2147  Labs/Imaging Results for orders placed or performed during the hospital encounter of 12/07/22 (from the past 48 hour(s))  Ethanol     Status: None    Collection Time: 12/07/22  5:33 PM  Result Value Ref Range   Alcohol, Ethyl (B) <10 <10 mg/dL    Comment: (NOTE) Lowest detectable limit for serum alcohol is 10 mg/dL.  For medical purposes only. Performed at [facility], [address] [place], [place] [address]   Acetaminophen level     Status: Abnormal   Collection Time: 12/07/22  5:33 PM  Result Value Ref Range   Acetaminophen (Tylenol), Serum <10 (L) 10 - 30 ug/mL    Comment: (NOTE) Therapeutic concentrations vary significantly. A range of 10-30 ug/mL  may be an effective concentration for many patients. However, some  are best treated at concentrations outside of this range. Acetaminophen concentrations >150 ug/mL at 4 hours after ingestion  and >50 ug/mL at 12 hours after ingestion are often associated with  toxic reactions.  Performed at [facility], [address], [place], [place] [address]   Salicylate level     Status: Abnormal   Collection Time: 12/07/22  5:33 PM  Result Value Ref Range   Salicylate Lvl <7.0 (L) 7.0 - 30.0 mg/dL    Comment: Performed at [facility], [address] [place], [place] [address]  Ammonia     Status: None   Collection Time: 12/07/22  5:33 PM  Result Value Ref Range   Ammonia 31 9 - 35 umol/L    Comment: Performed at [facility], [address] [place], [place] [address]  Hepatic function panel     Status: None   Collection Time: 12/07/22  5:35 PM  Result Value Ref Range   Total Protein 7.2 6.5 - 8.1 g/dL   Albumin 3.9 3.5 - 5.0 g/dL   AST 27 15 - 41 U/L   ALT 30 0 - 44 U/L   Alkaline Phosphatase 64 38 - 126 U/L   Total Bilirubin 0.4 0.3 - 1.2 mg/dL   Bilirubin, Direct <2.9 0.0 - 0.2 mg/dL   Indirect Bilirubin NOT CALCULATED 0.3 - 0.9 mg/dL    Comment: Performed at [facility], [address] [place], [place] [address]  Lipase, blood     Status: None   Collection Time: 12/07/22  5:35 PM  Result Value Ref Range   Lipase 27 11 - 51 U/L     Comment: Performed at [facility], [address], [place], [place] [address]  CBC with Differential     Status: Abnormal   Collection Time: 12/07/22  5:35 PM  Result Value Ref Range   WBC 6.6 4.0 - 10.5 K/uL   RBC 3.77 (L) 4.22 - 5.81 MIL/uL   Hemoglobin 11.6 (L) 13.0 - 17.0 g/dL   HCT 84.6 (L) 96.2 - 95.2 %   MCV 91.5 80.0 - 100.0 fL   MCH 30.8 26.0 - 34.0 pg   MCHC 33.6 30.0 - 36.0 g/dL   RDW 84.1 32.4 - 40.1 %   Platelets 108 (L) 150 - 400 K/uL   nRBC 0.0 0.0 - 0.2 %   Neutrophils Relative % 74 %   Neutro Abs 4.9 1.7 - 7.7 K/uL   Lymphocytes Relative 14 %   Lymphs Abs 0.9 0.7 - 4.0 K/uL   Monocytes  Relative 11 %   Monocytes Absolute 0.7 0.1 - 1.0 K/uL   Eosinophils Relative 0 %   Eosinophils Absolute 0.0 0.0 - 0.5 K/uL   Basophils Relative 0 %   Basophils Absolute 0.0 0.0 - 0.1 K/uL   Immature Granulocytes 1 %   Abs Immature Granulocytes 0.03 0.00 - 0.07 K/uL    Comment: Performed at [facility], [address], [place], [place] [address]  I-stat chem 8, ED     Status: Abnormal   Collection Time: 12/07/22  5:40 PM  Result Value Ref Range   Sodium 139 135 - 145 mmol/L   Potassium 3.5 3.5 - 5.1 mmol/L   Chloride 103 98 - 111 mmol/L   BUN 8 8 - 23 mg/dL   Creatinine, Ser 6.04 0.61 - 1.24 mg/dL   Glucose, Bld 540 (H) 70 - 99 mg/dL    Comment: Glucose reference range applies only to samples taken after fasting for at least 8 hours.   Calcium, Ion 1.19 1.15 - 1.40 mmol/L   TCO2 23 22 - 32 mmol/L   Hemoglobin 11.9 (L) 13.0 - 17.0 g/dL   HCT 98.1 (L) 19.1 - 47.8 %  Urinalysis, w/ Reflex to Culture (Infection Suspected) -Urine, Unspecified Source     Status: Abnormal   Collection Time: 12/07/22  7:30 PM  Result Value Ref Range   Specimen Source URINE, UNSPE    Color, Urine YELLOW YELLOW   APPearance HAZY (A) CLEAR   Specific Gravity, Urine 1.017 1.005 - 1.030   pH 5.0 5.0 - 8.0   Glucose, UA NEGATIVE NEGATIVE mg/dL   Hgb urine dipstick NEGATIVE NEGATIVE    Bilirubin Urine NEGATIVE NEGATIVE   Ketones, ur 20 (A) NEGATIVE mg/dL   Protein, ur 30 (A) NEGATIVE mg/dL   Nitrite NEGATIVE NEGATIVE   Leukocytes,Ua TRACE (A) NEGATIVE   RBC / HPF 0-5 0 - 5 RBC/hpf   WBC, UA 6-10 0 - 5 WBC/hpf    Comment:        Reflex urine culture not performed if WBC <=10, OR if Squamous epithelial cells >5. If Squamous epithelial cells >5 suggest recollection.    Bacteria, UA RARE (A) NONE SEEN   Squamous Epithelial / HPF 0-5 0 - 5 /HPF   Mucus PRESENT    Ca Oxalate Crys, UA PRESENT     Comment: Performed at [facility], [address] [place], [place] [address]  Urine rapid drug screen (hosp performed)     Status: None   Collection Time: 12/07/22  7:30 PM  Result Value Ref Range   Opiates NONE DETECTED NONE DETECTED   Cocaine NONE DETECTED NONE DETECTED   Benzodiazepines NONE DETECTED NONE DETECTED   Amphetamines NONE DETECTED NONE DETECTED   Tetrahydrocannabinol NONE DETECTED NONE DETECTED   Barbiturates NONE DETECTED NONE DETECTED    Comment: (NOTE) DRUG SCREEN FOR MEDICAL PURPOSES ONLY.  IF CONFIRMATION IS NEEDED FOR ANY PURPOSE, NOTIFY LAB WITHIN 5 DAYS.  LOWEST DETECTABLE LIMITS FOR URINE DRUG SCREEN Drug Class                     Cutoff (ng/mL) Amphetamine and metabolites    1000 Barbiturate and metabolites    200 Benzodiazepine                 200 Opiates and metabolites        300 Cocaine and metabolites        300 THC  50 Performed at [facility], [address], [place], [place] [address]    CT Cervical Spine Wo Contrast  Result Date: 12/07/2022 CLINICAL DATA:  Trauma EXAM: CT CERVICAL SPINE WITHOUT CONTRAST TECHNIQUE: Multidetector CT imaging of the cervical spine was performed without intravenous contrast. Multiplanar CT image reconstructions were also generated. RADIATION DOSE REDUCTION: This exam was performed according to the departmental dose-optimization program which includes automated  exposure control, adjustment of the mA and/or kV according to patient size and/or use of iterative reconstruction technique. COMPARISON:  11/02/2021 FINDINGS: Alignment: Alignment of the posterior margins of vertebral bodies appears normal. Skull base and vertebrae: No recent fracture is seen. Soft tissues and spinal canal: There is no central spinal stenosis. Prevertebral soft tissues are unremarkable. Disc levels: There is encroachment of neural foramina from C3 to C7 levels, more severe on the left side at C6-C7 level. Upper chest: Unremarkable. Other: Scattered arterial calcifications are seen. IMPRESSION: No recent fracture is seen in cervical spine. 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 Electronically Signed   By: Ernie Avena M.D.   On: 12/07/2022 20:29   CT Maxillofacial WO CM  Result Date: 12/07/2022 CLINICAL DATA:  Trauma EXAM: CT MAXILLOFACIAL WITHOUT CONTRAST TECHNIQUE: Multidetector CT imaging of the maxillofacial structures was performed. Multiplanar CT image reconstructions were also generated. RADIATION DOSE REDUCTION: This exam was performed according to the departmental dose-optimization program which includes automated exposure control, adjustment of the mA and/or kV according to patient size and/or use of iterative reconstruction technique. COMPARISON:  11/02/2021 FINDINGS: Osseous: There is comminuted fracture in the tip of nasal bones. There are small pockets of air adjacent to the fractures. Rest of the bony structures show no recent fractures. Orbits: Optic globes are symmetrical. Retrobulbar soft tissues are unremarkable. Sinuses: There are no air-fluid levels in the paranasal sinuses. There is no significant mucosal thickening. Soft tissues: There is subcutaneous contusion/hematoma in the frontal scalp extending more to the right. Limited intracranial: Unremarkable. IMPRESSION: Comminuted essentially undisplaced  fractures are noted in the tip of nasal bones. No other fractures are seen. There is subcutaneous contusion/hematoma in the frontal scalp extending more to the right without demonstrable adjacent fracture. No focal abnormalities are seen in orbits and paranasal sinuses. Electronically Signed   By: Ernie Avena M.D.   On: 12/07/2022 20:26   CT Head Wo Contrast  Result Date: 12/07/2022 CLINICAL DATA:  Trauma EXAM: CT HEAD WITHOUT CONTRAST TECHNIQUE: Contiguous axial images were obtained from the base of the skull through the vertex without intravenous contrast. RADIATION DOSE REDUCTION: This exam was performed according to the departmental dose-optimization program which includes automated exposure control, adjustment of the mA and/or kV according to patient size and/or use of iterative reconstruction technique. COMPARISON:  11/02/2021 FINDINGS: Brain: No acute intracranial findings are seen. Cortical sulci are prominent. There are no signs of bleeding within the cranium. Vascular: Unremarkable. Skull: No acute findings are seen. Sinuses/Orbits: Unremarkable. Other: None. IMPRESSION: No acute intracranial findings are seen.  Atrophy. Electronically Signed   By: Ernie Avena M.D.   On: 12/07/2022 20:14   DG Chest Portable 1 View  Result Date: 12/07/2022 CLINICAL DATA:  Altered mental status.  Fall. EXAM: PORTABLE CHEST 1 VIEW COMPARISON:  11/02/2021 FINDINGS: Shallow inspiration. Right central venous catheter with tip over the upper SVC region. No pneumothorax. Heart size and pulmonary vascularity are normal for technique. No airspace disease or consolidation in the lungs. No  pleural effusions. No pneumothorax. Mediastinal contours appear intact. Degenerative changes in the spine and shoulders. Multiple old rib fractures bilaterally. Old fracture deformity of the right clavicle. IMPRESSION: No evidence of active pulmonary disease. Degenerative changes and old fracture deformities. Electronically  Signed   By: Burman Nieves M.D.   On: 12/07/2022 18:20    Pending Labs Unresulted Labs (From admission, onward)     Start     Ordered   12/07/22 2136  Urine Culture (for pregnant, neutropenic or urologic patients or patients with an indwelling urinary catheter)  (Urine Labs)  Once,   R       Question:  Indication  Answer:  Altered mental status (if no other cause identified)   12/07/22 2135   12/07/22 2104  Valproic acid level  Once,   STAT        12/07/22 2103   12/07/22 2104  Levetiracetam level  Once,   URGENT        12/07/22 2103            Vitals/Pain Today's Vitals   12/07/22 1930 12/07/22 2004 12/07/22 2019 12/07/22 2030  BP:  (!) 109/58  (!) 112/56  Pulse: 85 79  73  Resp:   20 16  Temp:      TempSrc:      SpO2: 100% 99%  99%  Weight:      Height:      PainSc:        Isolation Precautions No active isolations  Medications Medications  ziprasidone (GEODON) injection 10 mg (0 mg Intramuscular Hold 12/07/22 1938)  nicotine (NICODERM CQ - dosed in mg/24 hours) patch 21 mg (21 mg Transdermal Patch Applied 12/07/22 2021)  levETIRAcetam (KEPPRA) IVPB 1500 mg/ 100 mL premix (1,500 mg Intravenous New Bag/Given 12/07/22 2137)  LORazepam (ATIVAN) injection 2 mg (2 mg Intravenous Given 12/07/22 1906)    Mobility Walks (unknown if it is independent or with device at baseline)     Focused Assessments Neuro Assessment Handoff:  Pt coming from [place] (independent living). Increased confusion for past week. Recent pancreatic cancer diagnosis w/chemo. Confusion started after chemo began. Pt came in after fall today. Abrasion noted on nose with hematoma on forehead. Patient initially agitated on arrival before shift change. 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 Pt has emergency contact [name]) who helps with patient's care and patient will listen to her when he's not listening to others. Urine shows possible UTI. Pt had brief seizure activity noted in ED. Receiving  loading dose of keppra now. CT head (-). (+) nose fracture.          Neuro Assessment: Exceptions to WDL (confused) Neuro Checks:      Has TPA been given? No If patient is a Neuro Trauma and patient is going to OR before floor call report to 4N Charge nurse: [phone] or [phone]   R Recommendations: See Admitting Provider Note  Report given to:   Additional Notes:

## 2022-12-07 NOTE — ED Notes (Signed)
Pt was placed back into bed after getting out of bed. Pts starting to have eye twitching and full body shaking. Pt was turned on his side, placed on a nonrebreather. Provider made aware. Pt  placed on cardiac monitor. Resident now at bedside. Orded 2mg  iv push ativan.

## 2022-12-07 NOTE — ED Notes (Signed)
Pt agreeable to an iv but refuses to removed tshirt and dress shirt at this time

## 2022-12-08 ENCOUNTER — Encounter (HOSPITAL_COMMUNITY): Payer: Self-pay | Admitting: Internal Medicine

## 2022-12-08 ENCOUNTER — Inpatient Hospital Stay (HOSPITAL_COMMUNITY): Payer: No Typology Code available for payment source

## 2022-12-08 DIAGNOSIS — G9341 Metabolic encephalopathy: Secondary | ICD-10-CM

## 2022-12-08 DIAGNOSIS — R55 Syncope and collapse: Secondary | ICD-10-CM

## 2022-12-08 DIAGNOSIS — G40909 Epilepsy, unspecified, not intractable, without status epilepticus: Secondary | ICD-10-CM | POA: Diagnosis not present

## 2022-12-08 DIAGNOSIS — N3000 Acute cystitis without hematuria: Secondary | ICD-10-CM | POA: Diagnosis not present

## 2022-12-08 LAB — COMPREHENSIVE METABOLIC PANEL
ALT: 24 U/L (ref 0–44)
AST: 22 U/L (ref 15–41)
Albumin: 3.3 g/dL — ABNORMAL LOW (ref 3.5–5.0)
Alkaline Phosphatase: 58 U/L (ref 38–126)
Anion gap: 10 (ref 5–15)
BUN: 9 mg/dL (ref 8–23)
CO2: 24 mmol/L (ref 22–32)
Calcium: 8.8 mg/dL — ABNORMAL LOW (ref 8.9–10.3)
Chloride: 104 mmol/L (ref 98–111)
Creatinine, Ser: 0.75 mg/dL (ref 0.61–1.24)
GFR, Estimated: >60 mL/min (ref >60)
Glucose, Bld: 104 mg/dL — ABNORMAL HIGH (ref 70–99)
Potassium: 3.3 mmol/L — ABNORMAL LOW (ref 3.5–5.1)
Sodium: 138 mmol/L (ref 135–145)
Total Bilirubin: 0.5 mg/dL (ref 0.3–1.2)
Total Protein: 6.3 g/dL — ABNORMAL LOW (ref 6.5–8.1)

## 2022-12-08 LAB — GLUCOSE, CAPILLARY: Glucose-Capillary: 131 mg/dL — ABNORMAL HIGH (ref 70–99)

## 2022-12-08 LAB — CBC
HCT: 30.9 % — ABNORMAL LOW (ref 39.0–52.0)
Hemoglobin: 10.3 g/dL — ABNORMAL LOW (ref 13.0–17.0)
MCH: 29.9 pg (ref 26.0–34.0)
MCHC: 33.3 g/dL (ref 30.0–36.0)
MCV: 89.6 fL (ref 80.0–100.0)
Platelets: 78 10*3/uL — ABNORMAL LOW (ref 150–400)
RBC: 3.45 MIL/uL — ABNORMAL LOW (ref 4.22–5.81)
RDW: 15.3 % (ref 11.5–15.5)
WBC: 3.8 10*3/uL — ABNORMAL LOW (ref 4.0–10.5)
nRBC: 0 % (ref 0.0–0.2)

## 2022-12-08 LAB — TROPONIN I (HIGH SENSITIVITY)
Troponin I (High Sensitivity): 16 ng/L (ref <18)
Troponin I (High Sensitivity): 15 ng/L (ref <18)

## 2022-12-08 LAB — URINE CULTURE: Culture: NO GROWTH

## 2022-12-08 LAB — BLOOD GAS, VENOUS
Acid-Base Excess: 4.2 mmol/L — ABNORMAL HIGH (ref 0.0–2.0)
Bicarbonate: 29.2 mmol/L — ABNORMAL HIGH (ref 20.0–28.0)
Drawn by: 59174
O2 Saturation: 68.8 %
Patient temperature: 36.9
pCO2, Ven: 44 mmHg (ref 44–60)
pH, Ven: 7.43 (ref 7.25–7.43)
pO2, Ven: 40 mmHg (ref 32–45)

## 2022-12-08 LAB — ECHOCARDIOGRAM COMPLETE
Area-P 1/2: 1.97 cm2
Height: 66 in
S' Lateral: 2.8 cm
Weight: 2201.07 oz

## 2022-12-08 LAB — TSH: TSH: 0.737 u[IU]/mL (ref 0.350–4.500)

## 2022-12-08 LAB — PHOSPHORUS: Phosphorus: 3 mg/dL (ref 2.5–4.6)

## 2022-12-08 LAB — HEMOGLOBIN A1C
Hgb A1c MFr Bld: 4.8 % (ref 4.8–5.6)
Mean Plasma Glucose: 91.06 mg/dL

## 2022-12-08 LAB — MAGNESIUM: Magnesium: 1.8 mg/dL (ref 1.7–2.4)

## 2022-12-08 MED ORDER — PERFLUTREN LIPID MICROSPHERE
1.0000 mL | INTRAVENOUS | Status: AC | PRN
  Administered 2022-12-08: 4 mL via INTRAVENOUS

## 2022-12-08 MED ORDER — ROPINIROLE HCL 1 MG PO TABS
1.0000 mg | ORAL_TABLET | Freq: Every day | ORAL | Status: DC
  Administered 2022-12-08 (×2): 1 mg via ORAL
  Filled 2022-12-08 (×2): qty 1

## 2022-12-08 MED ORDER — FERROUS GLUCONATE 324 (38 FE) MG PO TABS
324.0000 mg | ORAL_TABLET | Freq: Every day | ORAL | Status: DC
  Administered 2022-12-08 – 2022-12-09 (×2): 324 mg via ORAL
  Filled 2022-12-08 (×2): qty 1

## 2022-12-08 MED ORDER — SODIUM CHLORIDE 0.9% FLUSH
10.0000 mL | Freq: Two times a day (BID) | INTRAVENOUS | Status: DC
  Administered 2022-12-09: 10 mL

## 2022-12-08 MED ORDER — FINASTERIDE 5 MG PO TABS
5.0000 mg | ORAL_TABLET | Freq: Every day | ORAL | Status: DC
  Administered 2022-12-08 – 2022-12-09 (×2): 5 mg via ORAL
  Filled 2022-12-08 (×2): qty 1

## 2022-12-08 MED ORDER — SODIUM CHLORIDE 0.9 % IV SOLN
INTRAVENOUS | Status: AC
  Filled 2022-12-08: qty 10

## 2022-12-08 MED ORDER — DIVALPROEX SODIUM ER 500 MG PO TB24
1000.0000 mg | ORAL_TABLET | Freq: Every day | ORAL | Status: DC
  Administered 2022-12-08: 1000 mg via ORAL
  Filled 2022-12-08 (×2): qty 2

## 2022-12-08 MED ORDER — ESCITALOPRAM OXALATE 10 MG PO TABS
5.0000 mg | ORAL_TABLET | Freq: Every day | ORAL | Status: DC
  Administered 2022-12-08 – 2022-12-09 (×2): 5 mg via ORAL
  Filled 2022-12-08 (×2): qty 1

## 2022-12-08 MED ORDER — ORAL CARE MOUTH RINSE: 15.0000 mL | OROMUCOSAL | Status: DC | PRN

## 2022-12-08 MED ORDER — POTASSIUM CHLORIDE CRYS ER 20 MEQ PO TBCR
40.0000 meq | EXTENDED_RELEASE_TABLET | Freq: Once | ORAL | Status: AC
  Administered 2022-12-08: 40 meq via ORAL
  Filled 2022-12-08: qty 2

## 2022-12-08 MED ORDER — DONEPEZIL HCL 10 MG PO TABS
5.0000 mg | ORAL_TABLET | Freq: Every morning | ORAL | Status: DC
  Administered 2022-12-08 – 2022-12-09 (×2): 5 mg via ORAL
  Filled 2022-12-08 (×2): qty 1

## 2022-12-08 MED ORDER — ASPIRIN 81 MG PO TBEC
81.0000 mg | DELAYED_RELEASE_TABLET | Freq: Every day | ORAL | Status: DC
  Administered 2022-12-08 – 2022-12-09 (×2): 81 mg via ORAL
  Filled 2022-12-08 (×2): qty 1

## 2022-12-08 MED ORDER — HALOPERIDOL LACTATE 5 MG/ML IJ SOLN
5.0000 mg | Freq: Four times a day (QID) | INTRAMUSCULAR | Status: DC | PRN
  Administered 2022-12-08: 5 mg via INTRAVENOUS

## 2022-12-08 MED ORDER — QUETIAPINE FUMARATE 100 MG PO TABS
100.0000 mg | ORAL_TABLET | Freq: Every day | ORAL | Status: DC
  Administered 2022-12-08 (×2): 100 mg via ORAL
  Filled 2022-12-08 (×2): qty 1

## 2022-12-08 MED ORDER — SODIUM CHLORIDE 0.9% FLUSH: 10.0000 mL | INTRAVENOUS | Status: DC | PRN

## 2022-12-08 MED ORDER — ROSUVASTATIN CALCIUM 5 MG PO TABS
5.0000 mg | ORAL_TABLET | Freq: Every day | ORAL | Status: DC
  Administered 2022-12-08: 5 mg via ORAL
  Filled 2022-12-08: qty 1

## 2022-12-08 MED ORDER — SODIUM CHLORIDE 0.9 % IV SOLN: INTRAVENOUS | Status: AC

## 2022-12-08 MED ORDER — CHLORHEXIDINE GLUCONATE CLOTH 2 % EX PADS
6.0000 | MEDICATED_PAD | Freq: Every day | CUTANEOUS | Status: DC
  Administered 2022-12-09: 6 via TOPICAL

## 2022-12-08 MED ORDER — SODIUM CHLORIDE 0.9% FLUSH
3.0000 mL | Freq: Two times a day (BID) | INTRAVENOUS | Status: DC
  Administered 2022-12-08 (×2): 3 mL via INTRAVENOUS

## 2022-12-08 MED ORDER — SODIUM CHLORIDE 0.9 % IV SOLN
1.0000 g | INTRAVENOUS | Status: DC
  Administered 2022-12-08: 1 g via INTRAVENOUS

## 2022-12-08 MED ORDER — MELATONIN 3 MG PO TABS
3.0000 mg | ORAL_TABLET | Freq: Every day | ORAL | Status: DC
  Administered 2022-12-08 (×2): 3 mg via ORAL
  Filled 2022-12-08 (×2): qty 1

## 2022-12-08 MED ORDER — LEVETIRACETAM IN NACL 1500 MG/100ML IV SOLN
1500.0000 mg | Freq: Two times a day (BID) | INTRAVENOUS | Status: DC
  Administered 2022-12-08 – 2022-12-09 (×3): 1500 mg via INTRAVENOUS
  Filled 2022-12-08 (×3): qty 100

## 2022-12-08 MED ORDER — PANTOPRAZOLE SODIUM 40 MG PO TBEC
40.0000 mg | DELAYED_RELEASE_TABLET | Freq: Every day | ORAL | Status: DC
  Administered 2022-12-08 – 2022-12-09 (×2): 40 mg via ORAL
  Filled 2022-12-08 (×2): qty 1

## 2022-12-08 MED ORDER — NICOTINE 14 MG/24HR TD PT24
14.0000 mg | MEDICATED_PATCH | Freq: Every day | TRANSDERMAL | Status: DC
  Administered 2022-12-09: 14 mg via TRANSDERMAL
  Filled 2022-12-08: qty 1

## 2022-12-08 MED ORDER — HALOPERIDOL LACTATE 5 MG/ML IJ SOLN
INTRAMUSCULAR | Status: AC
  Filled 2022-12-08: qty 1

## 2022-12-08 NOTE — H&P (Signed)
History and Physical    Kerry [name] ZOX:[id number] DOB: 1945/08/01 DOA: 12/07/2022  PCP: [facility]  Patient coming from:  home  I have personally briefly reviewed patient's old medical records in [facility]  Chief Complaint: fall with seizure episode   HPI: Kerry [name] is a 77 y.o. male with medical history significant of Anxiety, Bipolar d/o , OSA , Seizure d/o , pancreatic cancer stage III on Chemo followed by [facility], who presents to ED with one week of increase confusion .  Patient missed several calls from his caregiver and when they arrived he was found on the floor and EMS was called.  Patient currently in ED is redirectable and able to say he thinks he had seizure today. He denies any current n/v/d/f /chills or pain. He does endorse urinary frequency.  ED Course: IN ED patient was noted to be agitated and delirious. He was given Geodon x 1 due to this.  Initial neuro exam nonfocal. Vitals stable , Patient CTH : NAD , labs without any infectious source although UA noted wbc  and LE. Case discussed with neurology who recommended loading with keppra  , MRI as well as EEG for further evaluation.   Vitals: 99.3, bp 125/77, hr 79, rr 18 , sat 98%  ETOH < 10 Tylenol level < 10  Ammonia 31 Wbc 6.6, hgb 11.6, MCV 91.5,  Plt 108 TP 7.2, Albumin 3.9, AST 27 Lipase 27 Na139, K 3.5, CL 103, cr 0.7 , Glu 109,   Hgb 11.9 Cxr: IMPRESSION: No evidence of active pulmonary disease. Degenerative changes and old fracture deformities.  UDS: neg UA: bacteria rare, wbc 6-10 trace LE CTH/CT cervical spine: NAD  CT Maxillofacial  MPRESSION: Comminuted essentially undisplaced fractures are noted in the tip of nasal bones. No other fractures are seen.   There is subcutaneous contusion/hematoma in the frontal scalp extending more to the right without demonstrable adjacent fracture.   No focal abnormalities are seen in orbits and paranasal sinuses. Tx:  lorazepam, geodon 10mg , nicotine patch, keppra 1500 mg Review of Systems: As per HPI otherwise 10 point review of systems negative.   Past Medical History:  Diagnosis Date   anxiety    Anxiety    Bipolar 1 disorder (HCC)    Sleep apnea     Past Surgical History:  Procedure Laterality Date   BILIARY BRUSHING  10/23/2021   Procedure: BILIARY BRUSHING;  Surgeon: Meridee Score Netty Starring., MD;  Location: [facility] ENDOSCOPY;  Service: Gastroenterology;;   BILIARY STENT PLACEMENT  10/23/2021   Procedure: BILIARY STENT PLACEMENT;  Surgeon: Lemar Lofty., MD;  Location: [facility] ENDOSCOPY;  Service: Gastroenterology;;   BIOPSY  10/23/2021   Procedure: BIOPSY;  Surgeon: Lemar Lofty., MD;  Location: [facility] ENDOSCOPY;  Service: Gastroenterology;;   ERCP N/A 10/23/2021   Procedure: ENDOSCOPIC RETROGRADE CHOLANGIOPANCREATOGRAPHY (ERCP);  Surgeon: Lemar Lofty., MD;  Location: [facility] ENDOSCOPY;  Service: Gastroenterology;  Laterality: N/A;   ESOPHAGOGASTRODUODENOSCOPY (EGD) WITH PROPOFOL N/A 10/23/2021   Procedure: ESOPHAGOGASTRODUODENOSCOPY (EGD) WITH PROPOFOL;  Surgeon: Meridee Score Netty Starring., MD;  Location: [facility] ENDOSCOPY;  Service: Gastroenterology;  Laterality: N/A;   FINE NEEDLE ASPIRATION  10/23/2021   Procedure: FINE NEEDLE ASPIRATION (FNA) LINEAR;  Surgeon: Lemar Lofty., MD;  Location: [facility] ENDOSCOPY;  Service: Gastroenterology;;   REMOVAL OF STONES  10/23/2021   Procedure: REMOVAL OF SLUDGE;  Surgeon: Lemar Lofty., MD;  Location: [facility] ENDOSCOPY;  Service: Gastroenterology;;   [name]  10/23/2021   Procedure: SPHINCTEROTOMY;  Surgeon: Lemar Lofty., MD;  Location: [facility] ENDOSCOPY;  Service: Gastroenterology;;   UPPER ESOPHAGEAL ENDOSCOPIC ULTRASOUND (EUS) N/A 10/23/2021   Procedure: UPPER ESOPHAGEAL ENDOSCOPIC ULTRASOUND (EUS);  Surgeon: Lemar Lofty., MD;  Location: [facility] ENDOSCOPY;  Service: Gastroenterology;  Laterality: N/A;     reports that he has been  smoking cigarettes. He has never used smokeless tobacco. He reports that he does not drink alcohol and does not use drugs.  Allergies  Allergen Reactions   Fluoride Other (See Comments)    Seizures and stroke after dental visit from using fluoride paste (per pt)     Family History  Problem Relation Age of Onset   Heart disease Neg Hx     Prior to Admission medications   Medication Sig Start Date End Date Taking? Authorizing Provider  aspirin EC 81 MG tablet Take 1 tablet (81 mg total) by mouth daily. 10/28/21  Yes Rodolph Bong, MD  Cholecalciferol (VITAMIN D) 50 MCG (2000 UT) tablet Take 2,000 Units by mouth at bedtime.   Yes [provider]  clotrimazole (LOTRIMIN) 1 % external solution Apply 1 application  topically daily as needed (For dystrophic nails).   Yes [provider]  donepezil (ARICEPT) 5 MG tablet Take 5 mg by mouth every morning. For memory   Yes [provider]  Emollient (CETAPHIL) cream Apply 1 application. topically daily.   Yes [provider]  escitalopram (LEXAPRO) 10 MG tablet Take 5 mg by mouth daily. For mental health   Yes [provider]  ferrous gluconate (FERGON) 324 MG tablet Take 324 mg by mouth daily. Take with Vitamin C/ascorbic acid   Yes [provider]  finasteride (PROSCAR) 5 MG tablet Take 5 mg by mouth daily. For overactive bladder   Yes [provider]  levETIRAcetam (KEPPRA) 750 MG tablet Take 2 tablets (1,500 mg total) by mouth 2 (two) times daily. Patient taking differently: Take 1,500 mg by mouth daily. 11/13/21  Yes Lewie Chamber, MD  melatonin 3 MG TABS tablet Take 3 mg by mouth at bedtime.   Yes [provider]  nicotine (NICODERM CQ - DOSED IN MG/24 HOURS) 14 mg/24hr patch Place 14 mg onto the skin daily.   Yes [provider]  pantoprazole (PROTONIX) 40 MG tablet Take 1 tablet (40 mg total) by mouth daily. 10/25/21  Yes Rodolph Bong, MD  QUEtiapine  (SEROQUEL) 100 MG tablet Take 100 mg by mouth at bedtime. For mood and sleep   Yes [provider]  rOPINIRole (REQUIP) 1 MG tablet Take 1 mg by mouth at bedtime.   Yes [provider]  rosuvastatin (CRESTOR) 10 MG tablet Take 0.5 tablets (5 mg total) by mouth at bedtime. For cholesterol 11/01/21  Yes Rodolph Bong, MD  sildenafil (VIAGRA) 100 MG tablet Take 100 mg by mouth daily as needed for erectile dysfunction (one hour prior to sexual activity (do not exceed 1 dose in 24 hour period)).   Yes [provider]  traZODone (DESYREL) 150 MG tablet Take 75 mg by mouth at bedtime as needed for sleep.   Yes [provider]  vitamin C (ASCORBIC ACID) 500 MG tablet Take 500 mg by mouth daily.   Yes [provider]  zolpidem (AMBIEN) 10 MG tablet Take 5 mg by mouth at bedtime as needed for sleep.   Yes [provider]  divalproex (DEPAKOTE ER) 500 MG 24 hr tablet Take 2 tablets (1,000 mg total) by mouth at bedtime. Patient  not taking: Reported on 11/22/2021 11/13/21   Lewie Chamber, MD    Physical Exam: Vitals:   12/07/22 2030 12/07/22 2100 12/07/22 2200 12/07/22 2252  BP: (!) 112/56 (!) 106/52 (!) 116/46   Pulse: 73 73 69   Resp: 16 17 15    Temp:    98.6 F (37 C)  TempSrc:    Oral  SpO2: 99% 97% 99%   Weight:      Height:        Constitutional: NAD, calm, comfortable Vitals:   12/07/22 2030 12/07/22 2100 12/07/22 2200 12/07/22 2252  BP: (!) 112/56 (!) 106/52 (!) 116/46   Pulse: 73 73 69   Resp: 16 17 15    Temp:    98.6 F (37 C)  TempSrc:    Oral  SpO2: 99% 97% 99%   Weight:      Height:       Eyes: PERRL, lids and conjunctivae normal ENMT: facial contusion nasal area  Neck: normal, supple, no masses, no thyromegaly Respiratory: clear to auscultation bilaterally, no wheezing, no crackles. Normal respiratory effort. No accessory muscle use.  Cardiovascular: Regular rate and rhythm, no murmurs / rubs / gallops. No extremity  edema. 2+ pedal pulses.  Abdomen: no tenderness, no masses palpated. No hepatosplenomegaly. Bowel sounds positive.  Musculoskeletal: no clubbing / cyanosis. No joint deformity upper and lower extremities. Good ROM, no contractures. Normal muscle tone.  Skin: no rashes, facial abrasion nasal area ,ulcers. No induration Neurologic: CN 2-12 grossly intact. Sensation intact,l. Strength 5/5 in all 4.  Psychiatric: Normal judgment and insight. Alert and oriented x 3. Normal mood.    Labs on Admission: I have personally reviewed following labs and imaging studies  CBC: Recent Labs  Lab 12/07/22 1735 12/07/22 1740  WBC 6.6  --   NEUTROABS 4.9  --   HGB 11.6* 11.9*  HCT 34.5* 35.0*  MCV 91.5  --   PLT 108*  --    Basic Metabolic Panel: Recent Labs  Lab 12/07/22 1740  NA 139  K 3.5  CL 103  GLUCOSE 109*  BUN 8  CREATININE 0.70   GFR: Estimated Creatinine Clearance: 72 mL/min (by C-G formula based on SCr of 0.7 mg/dL). Liver Function Tests: Recent Labs  Lab 12/07/22 1735  AST 27  ALT 30  ALKPHOS 64  BILITOT 0.4  PROT 7.2  ALBUMIN 3.9   Recent Labs  Lab 12/07/22 1735  LIPASE 27   Recent Labs  Lab 12/07/22 1733  AMMONIA 31   Coagulation Profile: No results for input(s): "INR", "PROTIME" in the last 168 hours. Cardiac Enzymes: No results for input(s): "CKTOTAL", "CKMB", "CKMBINDEX", "TROPONINI" in the last 168 hours. BNP (last 3 results) No results for input(s): "PROBNP" in the last 8760 hours. HbA1C: No results for input(s): "HGBA1C" in the last 72 hours. CBG: No results for input(s): "GLUCAP" in the last 168 hours. Lipid Profile: No results for input(s): "CHOL", "HDL", "LDLCALC", "TRIG", "CHOLHDL", "LDLDIRECT" in the last 72 hours. Thyroid Function Tests: No results for input(s): "TSH", "T4TOTAL", "FREET4", "T3FREE", "THYROIDAB" in the last 72 hours. Anemia Panel: No results for input(s): "VITAMINB12", "FOLATE", "FERRITIN", "TIBC", "IRON", "RETICCTPCT" in  the last 72 hours. Urine analysis:    Component Value Date/Time   COLORURINE YELLOW 12/07/2022 1930   APPEARANCEUR HAZY (A) 12/07/2022 1930   LABSPEC 1.017 12/07/2022 1930   PHURINE 5.0 12/07/2022 1930   GLUCOSEU NEGATIVE 12/07/2022 1930   HGBUR NEGATIVE 12/07/2022 1930   BILIRUBINUR NEGATIVE 12/07/2022 1930  KETONESUR 20 (A) 12/07/2022 1930   PROTEINUR 30 (A) 12/07/2022 1930   NITRITE NEGATIVE 12/07/2022 1930   LEUKOCYTESUR TRACE (A) 12/07/2022 1930    Radiological Exams on Admission: CT Cervical Spine Wo Contrast  Result Date: 12/07/2022 CLINICAL DATA:  Trauma EXAM: CT CERVICAL SPINE WITHOUT CONTRAST TECHNIQUE: Multidetector CT imaging of the cervical spine was performed without intravenous contrast. Multiplanar CT image reconstructions were also generated. RADIATION DOSE REDUCTION: This exam was performed according to the departmental dose-optimization program which includes automated exposure control, adjustment of the mA and/or kV according to patient size and/or use of iterative reconstruction technique. COMPARISON:  11/02/2021 FINDINGS: Alignment: Alignment of the posterior margins of vertebral bodies appears normal. Skull base and vertebrae: No recent fracture is seen. Soft tissues and spinal canal: There is no central spinal stenosis. Prevertebral soft tissues are unremarkable. Disc levels: There is encroachment of neural foramina from C3 to C7 levels, more severe on the left side at C6-C7 level. Upper chest: Unremarkable. Other: Scattered arterial calcifications are seen. IMPRESSION: No recent fracture is seen in cervical spine. 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 Electronically Signed   By: Ernie Avena M.D.   On: 12/07/2022 20:29   CT Maxillofacial WO CM  Result Date: 12/07/2022 CLINICAL DATA:  Trauma EXAM: CT MAXILLOFACIAL WITHOUT CONTRAST TECHNIQUE: Multidetector CT imaging of the maxillofacial structures  was performed. Multiplanar CT image reconstructions were also generated. RADIATION DOSE REDUCTION: This exam was performed according to the departmental dose-optimization program which includes automated exposure control, adjustment of the mA and/or kV according to patient size and/or use of iterative reconstruction technique. COMPARISON:  11/02/2021 FINDINGS: Osseous: There is comminuted fracture in the tip of nasal bones. There are small pockets of air adjacent to the fractures. Rest of the bony structures show no recent fractures. Orbits: Optic globes are symmetrical. Retrobulbar soft tissues are unremarkable. Sinuses: There are no air-fluid levels in the paranasal sinuses. There is no significant mucosal thickening. Soft tissues: There is subcutaneous contusion/hematoma in the frontal scalp extending more to the right. Limited intracranial: Unremarkable. IMPRESSION: Comminuted essentially undisplaced fractures are noted in the tip of nasal bones. No other fractures are seen. There is subcutaneous contusion/hematoma in the frontal scalp extending more to the right without demonstrable adjacent fracture. No focal abnormalities are seen in orbits and paranasal sinuses. Electronically Signed   By: Ernie Avena M.D.   On: 12/07/2022 20:26   CT Head Wo Contrast  Result Date: 12/07/2022 CLINICAL DATA:  Trauma EXAM: CT HEAD WITHOUT CONTRAST TECHNIQUE: Contiguous axial images were obtained from the base of the skull through the vertex without intravenous contrast. RADIATION DOSE REDUCTION: This exam was performed according to the departmental dose-optimization program which includes automated exposure control, adjustment of the mA and/or kV according to patient size and/or use of iterative reconstruction technique. COMPARISON:  11/02/2021 FINDINGS: Brain: No acute intracranial findings are seen. Cortical sulci are prominent. There are no signs of bleeding within the cranium. Vascular: Unremarkable. Skull: No  acute findings are seen. Sinuses/Orbits: Unremarkable. Other: None. IMPRESSION: No acute intracranial findings are seen.  Atrophy. Electronically Signed   By: Ernie Avena M.D.   On: 12/07/2022 20:14   DG Chest Portable 1 View  Result Date: 12/07/2022 CLINICAL DATA:  Altered mental status.  Fall. EXAM: PORTABLE CHEST 1 VIEW COMPARISON:  11/02/2021 FINDINGS: Shallow inspiration. Right central venous catheter with tip over the upper SVC region. No pneumothorax. Heart size and  pulmonary vascularity are normal for technique. No airspace disease or consolidation in the lungs. No pleural effusions. No pneumothorax. Mediastinal contours appear intact. Degenerative changes in the spine and shoulders. Multiple old rib fractures bilaterally. Old fracture deformity of the right clavicle. IMPRESSION: No evidence of active pulmonary disease. Degenerative changes and old fracture deformities. Electronically Signed   By: Burman Nieves M.D.   On: 12/07/2022 18:20    EKG: Independently reviewed. Pending   Assessment/Plan  Syncope event vs Seizure episode -patient found down at home in setting of one week of progressive confusion - concern for seizure episode with fall leading to facial trauma - case discussed with neurology  rec MRI/ EEG -patient s/p load keppra in ED -continue keppra iv bid for now ( as patient at times is agitated may note take tabs)\ -place on seizure precautions -unwitnessed episode patient unable to give history  - will complete syncope evaluation- echo in am, EKG cycle ce , monitor on tele  -continue neuro checks  - await final neuro rec    Acute Metabolic encephalopathy -UDS neg, Ammonia wnl  -possible due to post ictal stable, psych d/o on baseline dementia insetting of probable UTI -tx underlying cause, continue neuro evaluation  UTI, Early - start abx - in setting of change in ms , and possible seizure episode  -f/u on culture and de-escalate antibiotic    Comminuted essentially undisplaced fractures are noted in the tip of nasal bones -supportive care   Dementia -resume Aricept   Anxiety  Bipolar d/o Agitation -resume home regimen   -prn haldol  -s/p ativan/geodon in ED   OSA -unable to do cpap with nasal bone fx -at bedtime O2   Pancreatic Cancer  -stage III s/p resection  -on chemo  -MRI brain r/o possible mets     DVT prophylaxis: scd Code Status: full/ as discussed per patient wishes in event of cardiac arrest  Family Communication: none at bedside Disposition Plan: patient  expected to be admitted greater than 2 midnights  Consults called: neurology Dr Iver Nestle Admission status: progressive   Lurline Del MD [facility]  If 7PM-7AM, please contact night-coverage [email or web address] Password [facility]  12/08/2022, 12:19 AM

## 2022-12-08 NOTE — Plan of Care (Signed)
  Problem: Education: Goal: Knowledge of General Education information will improve Description: Including pain rating scale, medication(s)/side effects and non-pharmacologic comfort measures Outcome: Progressing   Problem: Health Behavior/Discharge Planning: Goal: Ability to manage health-related needs will improve Outcome: Progressing   Problem: Clinical Measurements: Goal: Ability to maintain clinical measurements within normal limits will improve Outcome: Progressing Goal: Diagnostic test results will improve Outcome: Progressing Goal: Respiratory complications will improve Outcome: Progressing Goal: Cardiovascular complication will be avoided Outcome: Progressing   Problem: Activity: Goal: Risk for activity intolerance will decrease Outcome: Progressing   Problem: Nutrition: Goal: Adequate nutrition will be maintained Outcome: Progressing   Problem: Coping: Goal: Level of anxiety will decrease Outcome: Progressing   Problem: Elimination: Goal: Will not experience complications related to bowel motility Outcome: Progressing Goal: Will not experience complications related to urinary retention Outcome: Progressing   Problem: Pain Managment: Goal: General experience of comfort will improve Outcome: Progressing   Problem: Safety: Goal: Ability to remain free from injury will improve Outcome: Progressing   Problem: Skin Integrity: Goal: Risk for impaired skin integrity will decrease Outcome: Progressing   Problem: Education: Goal: Knowledge of condition and prescribed therapy will improve Outcome: Progressing   Problem: Cardiac: Goal: Will achieve and/or maintain adequate cardiac output Outcome: Progressing   Problem: Physical Regulation: Goal: Complications related to the disease process, condition or treatment will be avoided or minimized Outcome: Progressing

## 2022-12-08 NOTE — Evaluation (Signed)
Physical Therapy Evaluation Patient Details Name: Kerry [name] MRN: [id number] DOB: [birth year] Today's Date: 12/08/2022  History of Present Illness  77 y.o. male presents 7/19 to ED with one week of increase confusion. He was found on the floor and EMS was called. With medical history significant of Anxiety, Bipolar d/o , OSA , Seizure d/o , pancreatic cancer stage III.  Clinical Impression  Pt admitted with above diagnosis. From ALF, pt reports ambulatory without AD PTA. Walks to dining room and staff drives him places as needed. Recalls falling but not immediately before. Oriented to person, place, situation, month, but provided wrong year "2022." Gait without AD at supervision level, mod I with RW for support. Pt feels more confident with RW. May benefit from OPPT follow-up. Will follow acutely. Pt currently with functional limitations due to the deficits listed below (see PT Problem List). Pt will benefit from acute skilled PT to increase their independence and safety with mobility to allow discharge.           Assistance Recommended at Discharge PRN  If plan is discharge home, recommend the following:  Can travel by private vehicle  Assistance with cooking/housework;Direct supervision/assist for medications management;Direct supervision/assist for financial management;Assist for transportation        Equipment Recommendations Rolling walker (2 wheels)  Recommendations for Other Services       Functional Status Assessment Patient has had a recent decline in their functional status and demonstrates the ability to make significant improvements in function in a reasonable and predictable amount of time.     Precautions / Restrictions Precautions Precautions: Fall Restrictions Weight Bearing Restrictions: No      Mobility  Bed Mobility Overal bed mobility: Modified Independent             General bed mobility comments: no assist, extra time    Transfers Overall  transfer level: Needs assistance Equipment used: Rolling walker (2 wheels) Transfers: Sit to/from Stand Sit to Stand: Supervision           General transfer comment: Supervision for safety, good power up, stable with use of RW for support.    Ambulation/Gait Ambulation/Gait assistance: Supervision Gait Distance (Feet): 125 Feet Assistive device: Rolling walker (2 wheels), None Gait Pattern/deviations: Step-through pattern, Decreased stride length Gait velocity: decr Gait velocity interpretation: 1.31 - 2.62 ft/sec, indicative of limited community ambulator Pre-gait activities: static march with UE support General Gait Details: Short distance gait without AD, minor instability but without overt LOB or buckling. HR in 70s throughout. Provided RW which added some support that pt felt was beneficial and improved his confidence. Supervision without AD, mod I with RW.  Stairs            Wheelchair Mobility     Tilt Bed    Modified Rankin (Stroke Patients Only) Modified Rankin (Stroke Patients Only) Pre-Morbid Rankin Score: No symptoms Modified Rankin: Slight disability     Balance Overall balance assessment: Mild deficits observed, not formally tested                                           Pertinent Vitals/Pain Pain Assessment Pain Assessment: No/denies pain    Home Living Family/patient expects to be discharged to:: Assisted living Living Arrangements: Other (Comment) (ALF) Available Help at Discharge: Other (Comment) (Staff at ALF) Type of Home: Assisted living Home Access: Level entry  Home Layout: One level Home Equipment:  (No ambulatory devices reported)      Prior Function Prior Level of Function : Independent/Modified Independent             Mobility Comments: Does not use AD, ambulates to dining hall ADLs Comments: States staff drives him places, they cook meals. Otherwise does things on his own.     Hand  Dominance        Extremity/Trunk Assessment   Upper Extremity Assessment Upper Extremity Assessment: Defer to OT evaluation    Lower Extremity Assessment Lower Extremity Assessment: Overall WFL for tasks assessed       Communication   Communication: No difficulties  Cognition Arousal/Alertness: Lethargic Behavior During Therapy: WFL for tasks assessed/performed Overall Cognitive Status: No family/caregiver present to determine baseline cognitive functioning                                 General Comments: Oriented person, place, situation, month, but gave incorrect year as "2022". Asks if I will take him outside to smoke.        General Comments      Exercises     Assessment/Plan    PT Assessment Patient needs continued PT services  PT Problem List Decreased activity tolerance;Decreased balance;Decreased mobility;Decreased cognition;Decreased knowledge of use of DME       PT Treatment Interventions DME instruction;Gait training;Stair training;Functional mobility training;Therapeutic activities;Therapeutic exercise;Balance training;Neuromuscular re-education;Cognitive remediation;Patient/family education    PT Goals (Current goals can be found in the Care Plan section)  Acute Rehab PT Goals Patient Stated Goal: Go home today PT Goal Formulation: With patient Time For Goal Achievement: 12/15/22 Potential to Achieve Goals: Good    Frequency Min 3X/week     Co-evaluation               AM-PAC PT "6 Clicks" Mobility  Outcome Measure Help needed turning from your back to your side while in a flat bed without using bedrails?: None Help needed moving from lying on your back to sitting on the side of a flat bed without using bedrails?: None Help needed moving to and from a bed to a chair (including a wheelchair)?: A Little Help needed standing up from a chair using your arms (e.g., wheelchair or bedside chair)?: A Little Help needed to walk in  hospital room?: A Little Help needed climbing 3-5 steps with a railing? : A Little 6 Click Score: 20    End of Session   Activity Tolerance: Patient tolerated treatment well Patient left: in bed;with call bell/phone within reach;with bed alarm set Nurse Communication: Mobility status PT Visit Diagnosis: Unsteadiness on feet (R26.81);Other abnormalities of gait and mobility (R26.89);History of falling (Z91.81)    Time: 1610-9604 PT Time Calculation (min) (ACUTE ONLY): 22 min   Charges:   PT Evaluation $PT Eval Low Complexity: 1 Low   PT General Charges $$ ACUTE PT VISIT: 1 Visit         Kathlyn Sacramento, PT, DPT [facility]  Rehabilitation Services Physical Therapist Office: [phone] Website: [email or web address]   Berton Mount 12/08/2022, 11:15 AM

## 2022-12-08 NOTE — Plan of Care (Signed)
  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  Agree with ED plan for MRI brain to rule out any metastatic disease to the brain or other acute intracranial process  Additionally recommended Keppra and Depakote levels to confirm whether patient has been missing medications; Depakote level is low which confirms at least part of his breakthrough seizure etiology is subtherapeutic antiseizure medication (prior level of 69)  Please continue home AEDS, listed as  -valproic acid ER 1,000mg  qhs. -levetiracetam 1,500mg  BID  Altered mental status as described more consistent with psychiatric issues rather than concerning for nonconvulsive status epilepticus, however if he is not returning to baseline with supportive care and resumption of his home medications EEG should be obtained and can consider full neuro consult if felt to be needed.   Current Outpatient Medications  Medication Instructions   ascorbic acid (VITAMIN C) 500 mg, Oral, Daily   aspirin EC 81 mg, Oral, Daily   clotrimazole (LOTRIMIN) 1 % external solution 1 application , Topical, Daily PRN   divalproex (DEPAKOTE ER) 1,000 mg, Oral, Daily at bedtime   donepezil (ARICEPT) 5 mg, Oral, Every morning, For memory   Emollient (CETAPHIL) cream 1 application , Topical, Daily   escitalopram (LEXAPRO) 5 mg, Oral, Daily, For mental health   ferrous gluconate (FERGON) 324 mg,  Oral, Daily, Take with Vitamin C/ascorbic acid   finasteride (PROSCAR) 5 mg, Oral, Daily, For overactive bladder   levETIRAcetam (KEPPRA) 1,500 mg, Oral, 2 times daily   melatonin 3 mg, Oral, Daily at bedtime   nicotine (NICODERM CQ - DOSED IN MG/24 HOURS) 14 mg, Transdermal, Daily   pantoprazole (PROTONIX) 40 mg, Oral, Daily   QUEtiapine (SEROQUEL) 100 mg, Oral, Daily at bedtime, For mood and sleep   rOPINIRole (REQUIP) 1 mg, Oral, Daily at bedtime   rosuvastatin (CRESTOR) 5 mg, Oral, Daily at bedtime, For cholesterol   sildenafil (VIAGRA) 100 mg, Oral, Daily PRN   traZODone (DESYREL) 75 mg, Oral, At bedtime PRN   Vitamin D 2,000 Units, Oral, Daily at bedtime   zolpidem (AMBIEN) 5 mg, Oral, At bedtime PRN

## 2022-12-08 NOTE — Evaluation (Signed)
Occupational Therapy Evaluation Patient Details Name: Kerry [name] MRN: [id number] DOB: 1945-08-12 Today's Date: 12/08/2022   History of Present Illness 77 y.o. male presents 7/19 to ED with one week of increase confusion. He was found on the floor and EMS was called. With medical history significant of Anxiety, Bipolar d/o , OSA , Seizure d/o , pancreatic cancer stage III.   Clinical Impression   Pt admitted with the above diagnosis. Pt currently with functional limitations due to the deficits listed below (see OT Problem List). Eval limited d/t pt's level of arousal. Pt appears to be at or close to baseline for BADL tasks. Verbalizes that he is not very active. Demonstrates poor endurance and activity tolerance.  Pt will benefit from acute skilled OT to increase their safety and independence with ADL and functional mobility for ADL to facilitate discharge. Do not recommend any follow up OT services at discharge. May benefit from shower chair to increase safety during bathing. Next session OT will provide HEP to focus on endurance and activity tolerance.        Recommendations for follow up therapy are one component of a multi-disciplinary discharge planning process, led by the attending physician.  Recommendations may be updated based on patient status, additional functional criteria and insurance authorization.   Assistance Recommended at Discharge PRN  Patient can return home with the following Assist for transportation;Assistance with cooking/housework;Help with stairs or ramp for entrance    Functional Status Assessment  Patient has had a recent decline in their functional status and demonstrates the ability to make significant improvements in function in a reasonable and predictable amount of time.  Equipment Recommendations  Tub/shower seat       Precautions / Restrictions Precautions Precautions: Fall Precaution Comments: seizure Restrictions Weight Bearing Restrictions: No       Mobility Bed Mobility Overal bed mobility: Modified Independent    General bed mobility comments: No VC or physical assist required    Transfers Overall transfer level: Needs assistance   Transfers: Sit to/from Stand Sit to Stand: Supervision      General transfer comment: SBA for safety. No LOB noted. Able to complete 2 lateral steps to the right towards HOB.      Balance Overall balance assessment: No apparent balance deficits (not formally assessed)           ADL either performed or assessed with clinical judgement   ADL    General ADL Comments: Pt will require set-up/SBA to complete all BADL tasks at this time.     Vision Baseline Vision/History: 1 Wears glasses (readers) Ability to See in Adequate Light: 0 Adequate Patient Visual Report: No change from baseline Vision Assessment?: No apparent visual deficits            Pertinent Vitals/Pain Pain Assessment Pain Assessment: No/denies pain     Hand Dominance Right   Extremity/Trunk Assessment Upper Extremity Assessment Upper Extremity Assessment: Overall WFL for tasks assessed (baseline left shoulder issues)   Lower Extremity Assessment Lower Extremity Assessment: Overall WFL for tasks assessed   Cervical / Trunk Assessment Cervical / Trunk Assessment: Other exceptions Cervical / Trunk Exceptions: forward head and rounded shoulders.   Communication Communication Communication: No difficulties   Cognition Arousal/Alertness: Lethargic Behavior During Therapy: Flat affect Overall Cognitive Status: No family/caregiver present to determine baseline cognitive functioning        General Comments: drowsy as OT woke him up from sleeping. Able to answer all questions asked correctly and without hesitation.  General Comments  bilateral bruising under both eyes. laceration on top of nose            Home Living Family/patient expects to be discharged to:: Assisted living Living Arrangements:   (N/A) Available Help at Discharge: Other (Comment) (staff at ALF) Type of Home: Assisted living Home Access: Level entry     Home Layout: One level    Home Equipment: None   Additional Comments: Walk in shower with grab bars. No shower seat. Walks to [employer]. Reports that he watches alot of TV and is not physical active.      Prior Functioning/Environment Prior Level of Function : Independent/Modified Independent    Mobility Comments: Indepenedent ADLs Comments: independent with BADL tasks.        OT Problem List: Decreased activity tolerance      OT Treatment/Interventions: Therapeutic exercise;Self-care/ADL training    OT Goals(Current goals can be found in the care plan section) Acute Rehab OT Goals Patient Stated Goal: none stated OT Goal Formulation: Patient unable to participate in goal setting Time For Goal Achievement: 12/22/22 Potential to Achieve Goals: Good  OT Frequency: Min 1X/week       AM-PAC OT "6 Clicks" Daily Activity     Outcome Measure Help from another person eating meals?: None Help from another person taking care of personal grooming?: A Little Help from another person toileting, which includes using toliet, bedpan, or urinal?: A Little Help from another person bathing (including washing, rinsing, drying)?: A Little Help from another person to put on and taking off regular upper body clothing?: A Little Help from another person to put on and taking off regular lower body clothing?: A Little 6 Click Score: 19   End of Session    Activity Tolerance: Patient limited by fatigue Patient left: in bed;with call bell/phone within reach;with bed alarm set  OT Visit Diagnosis: Muscle weakness (generalized) (M62.81)                Time: 5621-3086 OT Time Calculation (min): 9 min Charges:  OT General Charges $OT Visit: 1 Visit OT Evaluation $OT Eval Low Complexity: 1 Low  Kerry [name], OTR/L,CBIS  Supplemental OT - [facility] [employer] Secure  Chat Preferred    Kerry [name], Charisse March 12/08/2022, 2:47 PM

## 2022-12-08 NOTE — Plan of Care (Signed)
  Problem: Education: Goal: Knowledge of General Education information will improve Description: Including pain rating scale, medication(s)/side effects and non-pharmacologic comfort measures Outcome: Progressing   Problem: Clinical Measurements: Goal: Respiratory complications will improve Outcome: Progressing   Problem: Activity: Goal: Risk for activity intolerance will decrease Outcome: Progressing   Problem: Nutrition: Goal: Adequate nutrition will be maintained Outcome: Progressing   Problem: Coping: Goal: Level of anxiety will decrease Outcome: Progressing   Problem: Elimination: Goal: Will not experience complications related to bowel motility Outcome: Progressing Goal: Will not experience complications related to urinary retention Outcome: Progressing   Problem: Pain Managment: Goal: General experience of comfort will improve Outcome: Progressing   

## 2022-12-08 NOTE — Progress Notes (Addendum)
Same day note  Patient seen and examined at bedside.  Patient was admitted to the hospital for fall with seizure episode.  At the time of my evaluation, patient complains of feeling okay.  Denies any headache, nausea, vomiting, fever or chills.  Physical examination reveals alert awake oriented, facial contusion over the nasal area/abrasion.  Laboratory data and imaging was reviewed  Assessment and Plan.  Syncope versus seizure episode.   Found down at home with confusion.  EEG and MRI recommended as per neurology.  Communicated with neurology and neurology recommend restarting back on Depakote.  Will give Depakote 1 g now and then start q. nightly.  Received Keppra load in the ED. continue neurochecks, seizure precautions.  Acute metabolic encephalopathy  UDS negative, ammonia within normal limits.  Likely postictal.  Currently oriented.  Appears slightly sleepy.  Possible early UTI.  Urine culture sent.  On IV Rocephin.  Communicated undisplaced fracture of the tip of the nasal bones.  Supportive care.  Denies overt pain.  Dementia.  On Aricept.  Continue.  Mild hypokalemia.  Will replace.  Check levels in AM.  Obstructive sleep apnea.  Unable to do CPAP due to nasal fracture.  Nasal oxygen.  Pancreatic cancer stage III status post resection.  On chemotherapy.  MRI of the brain recommended for ruling out mets.  Denies abdominal pain nausea vomiting.  Patient is from assisted living facility.  Will consult TOC.  Likely disposition in 1 to 2 days.  No Charge  Signed,  Tenny Craw, MD [facility]

## 2022-12-08 NOTE — Hospital Course (Signed)
Same day note  Patient seen and examined at bedside.  Patient was admitted to the hospital for fall with seizure episode.  At the time of my evaluation, patient complains of  Physical examination reveals alert awake oriented, facial contusion over the nasal area/abrasion.  Laboratory data and imaging was reviewed  Assessment and Plan.  Syncope versus seizure episode.  Found down at home with confusion.  EEG and MRI recommended as per neurology.  Communicated with neurology and recommend restarting back on Depakote.  Received Keppra load in the ED.  Neurochecks seizure precautions.  Acute metabolic encephalopathy  UDS negative ammonia within normal limits.  Likely postictal.  Possible early UTI.  Urine culture sent.  Antibiotic.  Communicated undisplaced fracture of the tip of the nasal bones.  Supportive care.  Dementia.  On Aricept.  Continue.  Obstructive sleep apnea.  Unable to do CPAP due to nasal fracture.  Nasal oxygen.  Pancreatic cancer stage III status post resection.  On chemotherapy.  MRI of the brain recommended for ruling out mets.  No Charge  Signed,  Tenny Craw, MD [facility]

## 2022-12-08 NOTE — Progress Notes (Addendum)
Patient off the unit for MRI 1640 Patient back to the unit

## 2022-12-08 NOTE — Progress Notes (Signed)
  Echocardiogram 2D Echocardiogram has been performed.  Leda Roys RDCS 12/08/2022, 1:32 PM

## 2022-12-09 DIAGNOSIS — G40909 Epilepsy, unspecified, not intractable, without status epilepticus: Secondary | ICD-10-CM

## 2022-12-09 LAB — CBC
HCT: 30.4 % — ABNORMAL LOW (ref 39.0–52.0)
Hemoglobin: 9.8 g/dL — ABNORMAL LOW (ref 13.0–17.0)
MCH: 29.7 pg (ref 26.0–34.0)
MCHC: 32.2 g/dL (ref 30.0–36.0)
MCV: 92.1 fL (ref 80.0–100.0)
Platelets: 61 10*3/uL — ABNORMAL LOW (ref 150–400)
RBC: 3.3 MIL/uL — ABNORMAL LOW (ref 4.22–5.81)
RDW: 15.3 % (ref 11.5–15.5)
WBC: 2.6 10*3/uL — ABNORMAL LOW (ref 4.0–10.5)
nRBC: 0 % (ref 0.0–0.2)

## 2022-12-09 LAB — BASIC METABOLIC PANEL
Anion gap: 5 (ref 5–15)
BUN: 6 mg/dL — ABNORMAL LOW (ref 8–23)
CO2: 24 mmol/L (ref 22–32)
Calcium: 8.6 mg/dL — ABNORMAL LOW (ref 8.9–10.3)
Chloride: 111 mmol/L (ref 98–111)
Creatinine, Ser: 0.64 mg/dL (ref 0.61–1.24)
GFR, Estimated: >60 mL/min (ref >60)
Glucose, Bld: 90 mg/dL (ref 70–99)
Potassium: 3.6 mmol/L (ref 3.5–5.1)
Sodium: 140 mmol/L (ref 135–145)

## 2022-12-09 LAB — URINE CULTURE: Culture: NO GROWTH

## 2022-12-09 LAB — GLUCOSE, CAPILLARY: Glucose-Capillary: 83 mg/dL (ref 70–99)

## 2022-12-09 LAB — MAGNESIUM: Magnesium: 1.7 mg/dL (ref 1.7–2.4)

## 2022-12-09 MED ORDER — DIVALPROEX SODIUM ER 500 MG PO TB24: 1000.0000 mg | ORAL_TABLET | Freq: Every day | ORAL | 0 refills | Status: DC

## 2022-12-09 MED ORDER — DIVALPROEX SODIUM ER 500 MG PO TB24: 1000.0000 mg | ORAL_TABLET | Freq: Every day | ORAL | 0 refills | Status: AC

## 2022-12-09 MED ORDER — LEVETIRACETAM 750 MG PO TABS: 1500.0000 mg | ORAL_TABLET | Freq: Two times a day (BID) | ORAL | 0 refills | Status: AC

## 2022-12-09 MED ORDER — HEPARIN SOD (PORK) LOCK FLUSH 100 UNIT/ML IV SOLN
500.0000 [IU] | INTRAVENOUS | Status: AC | PRN
  Administered 2022-12-09: 500 [IU]

## 2022-12-09 MED ORDER — LEVETIRACETAM 750 MG PO TABS: 1500.0000 mg | ORAL_TABLET | Freq: Two times a day (BID) | ORAL | 0 refills | Status: DC

## 2022-12-09 NOTE — Progress Notes (Signed)
Discharge instructions given to care giver, verbalized undersrtanding, dropped to main entrance A to care giver's car.

## 2022-12-09 NOTE — Discharge Summary (Signed)
Physician Discharge Summary  Kerry [name] ZDG:[id number] DOB: 1945/08/19 DOA: 12/07/2022  PCP: [facility]  Admit date: 12/07/2022 Discharge date: 12/09/2022  Admitted From: Independent living facility Disposition: Independent living facility  Recommendations for Outpatient Follow-up:  Follow up with PCP in 1-2 weeks Follow-up with neurology  Home Health: PT/OT Equipment/Devices: Rolling walker  Discharge Condition: Fair CODE STATUS: Full code Diet recommendation: Regular diet  Discharge summary: 77 year old with history of anxiety, bipolar disorder, sleep apnea, seizure disorder, pancreatic cancer stage III on chemo presented to the emergency room with about 1 week of increased confusion.  Apparently he missed several calls from his caregiver, when they arrived to his apartment he was found on the floor and EMS was called.  He thought he might have a seizure.  In the emergency room he was hemodynamically stable.  He was agitated and delirious initially but that improved.  Lab workup without evidence of infectious source.  MRI and EEG without any acute findings.  Skeletal survey positive for undisplaced fracture of the tip of the nasal bones but no other fractures.  Patient was admitted with acute metabolic encephalopathy likely secondary to seizure disorder.  Syncope event versus seizure episode: Found down at home.  Facial injury.  MRI without evidence of stroke or new findings. Seen by neurology.  They recommended Keppra 1500 twice daily, apparently patient was taking only once a day, serum levels are pending. Valproic acid 1000 mg daily, apparently currently not taking.  Levels are subtherapeutic. Patient remains a stable, he did not have any new episodes.  His mental status has improved.  He is discharging home on above-mentioned changes and antiepileptic drugs.  Acute metabolic encephalopathy: Secondary to postictal condition.  Improved. UDS negative, ammonia  normal  Soft tissue injury, nondisplaced tip of the nasal bone fracture: Symptomatic management.  Suspected UTI: Unlikely.  Cultures negative so far.  Discontinue antibiotics.  Multiple medical issues including Anxiety and bipolar disorder: Stable. Pancreatic cancer, brain MRI without evidence of metastatic lesions.  He is on chemotherapy and follows up at cancer center in [place].  Mobilized around.  Has supportive environment at independent living facility.  Eager to go home.  Stable for discharge.  Neurology to schedule follow-up.   Discharge Diagnoses:  Principal Problem:   Seizure disorder [facility])    Discharge Instructions  Discharge Instructions     Diet general   Complete by: As directed    Increase activity slowly   Complete by: As directed       Allergies as of 12/09/2022       Reactions   Fluoride Other (See Comments)   Seizures and stroke after dental visit from using fluoride paste (per pt)        Medication List     STOP taking these medications    sildenafil 100 MG tablet Commonly known as: VIAGRA       TAKE these medications    ascorbic acid 500 MG tablet Commonly known as: VITAMIN C Take 500 mg by mouth daily.   aspirin EC 81 MG tablet Take 1 tablet (81 mg total) by mouth daily.   cetaphil cream Apply 1 application. topically daily.   clotrimazole 1 % external solution Commonly known as: LOTRIMIN Apply 1 application  topically daily as needed (For dystrophic nails).   divalproex 500 MG 24 hr tablet Commonly known as: DEPAKOTE ER Take 2 tablets (1,000 mg total) by mouth at bedtime.   donepezil 5 MG tablet Commonly known as: ARICEPT Take 5  mg by mouth every morning. For memory   escitalopram 10 MG tablet Commonly known as: LEXAPRO Take 5 mg by mouth daily. For mental health   ferrous gluconate 324 MG tablet Commonly known as: FERGON Take 324 mg by mouth daily. Take with Vitamin C/ascorbic acid   finasteride 5 MG  tablet Commonly known as: PROSCAR Take 5 mg by mouth daily. For overactive bladder   levETIRAcetam 750 MG tablet Commonly known as: KEPPRA Take 2 tablets (1,500 mg total) by mouth 2 (two) times daily. What changed: when to take this   melatonin 3 MG Tabs tablet Take 3 mg by mouth at bedtime.   nicotine 14 mg/24hr patch Commonly known as: NICODERM CQ - dosed in mg/24 hours Place 14 mg onto the skin daily.   pantoprazole 40 MG tablet Commonly known as: PROTONIX Take 1 tablet (40 mg total) by mouth daily.   QUEtiapine 100 MG tablet Commonly known as: SEROQUEL Take 100 mg by mouth at bedtime. For mood and sleep   rOPINIRole 1 MG tablet Commonly known as: REQUIP Take 1 mg by mouth at bedtime.   rosuvastatin 10 MG tablet Commonly known as: CRESTOR Take 0.5 tablets (5 mg total) by mouth at bedtime. For cholesterol   traZODone 150 MG tablet Commonly known as: DESYREL Take 75 mg by mouth at bedtime as needed for sleep.   Vitamin D 50 MCG (2000 UT) tablet Take 2,000 Units by mouth at bedtime.   zolpidem 10 MG tablet Commonly known as: AMBIEN Take 5 mg by mouth at bedtime as needed for sleep.        Allergies  Allergen Reactions   Fluoride Other (See Comments)    Seizures and stroke after dental visit from using fluoride paste (per pt)     Consultations: Neurology   Procedures/Studies: MR BRAIN WO CONTRAST  Result Date: 12/08/2022 CLINICAL DATA:  Initial evaluation for syncope/presyncope. EXAM: MRI HEAD WITHOUT CONTRAST TECHNIQUE: Multiplanar, multiecho pulse sequences of the brain and surrounding structures were obtained without intravenous contrast. COMPARISON:  Prior CT from 12/07/2022. FINDINGS: Brain: Generalized age-related cerebral atrophy. Patchy T2/FLAIR hyperintensity involving the periventricular deep white matter both cerebral hemispheres, most like related chronic microvascular ischemic disease, mild in nature. No evidence for acute or subacute ischemia.  Gray-white matter differentiation maintained. No areas of chronic cortical infarction. No acute intracranial hemorrhage. Few small chronic micro hemorrhages noted, likely small vessel related. No mass lesion, midline shift or mass effect. No hydrocephalus or extra-axial fluid collection. Pituitary gland suprasellar region within normal limits. Vascular: Major intracranial vascular flow voids are maintained. Skull and upper cervical spine: Craniocervical junction within normal limits. Bone marrow signal intensity normal. Suspected small soft tissue contusion at the right forehead. Sinuses/Orbits: Globes and orbital soft tissues demonstrate no acute finding. Paranasal sinuses are largely clear. No significant mastoid effusion. Other: None. IMPRESSION: 1. No acute intracranial abnormality. 2. Age-related cerebral atrophy with mild chronic small vessel ischemic disease. 3. Suspected small soft tissue contusion at the right forehead. Electronically Signed   By: Rise Mu M.D.   On: 12/08/2022 19:07   ECHOCARDIOGRAM COMPLETE  Result Date: 12/08/2022    ECHOCARDIOGRAM REPORT   Patient Name:   Kerry [name] Date of Exam: 12/08/2022 Medical Rec #:  [id number]       Height:       66.0 in Accession #:    [id number]      Weight:       137.6 lb Date of Birth:  11-04-1945  BSA:          1.706 m Patient Age:    76 years        BP:           90/60 mmHg Patient Gender: M               HR:           67 bpm. Exam Location:  Inpatient Procedure: 2D Echo, Cardiac Doppler, Color Doppler and Intracardiac            Opacification Agent Indications:    Syncope; R55 Syncope  History:        Patient has no prior history of Echocardiogram examinations.  Sonographer:    Harriette Bouillon RDCS Referring Phys: [id number] SARA-MAIZ A THOMAS IMPRESSIONS  1. Extremely limited; LV function appears to be preserved; focal wall motion abnormality cannot be excluded; doppler suboptimal.  2. Left ventricular ejection fraction, by estimation,  is 60 to 65%. The left ventricle has normal function. The left ventricle has no regional wall motion abnormalities. Left ventricular diastolic parameters are consistent with Grade I diastolic dysfunction (impaired relaxation).  3. Right ventricular systolic function is normal. The right ventricular size is normal.  4. The mitral valve is normal in structure. No evidence of mitral valve regurgitation. No evidence of mitral stenosis.  5. The aortic valve was not well visualized. Aortic valve regurgitation is not visualized. No aortic stenosis is present. Comparison(s): No prior Echocardiogram. FINDINGS  Left Ventricle: Left ventricular ejection fraction, by estimation, is 60 to 65%. The left ventricle has normal function. The left ventricle has no regional wall motion abnormalities. Definity contrast agent was given IV to delineate the left ventricular  endocardial borders. The left ventricular internal cavity size was normal in size. There is no left ventricular hypertrophy. Left ventricular diastolic parameters are consistent with Grade I diastolic dysfunction (impaired relaxation). Right Ventricle: The right ventricular size is normal. Right ventricular systolic function is normal. Left Atrium: Left atrial size was normal in size. Right Atrium: Right atrial size was normal in size. Pericardium: There is no evidence of pericardial effusion. Mitral Valve: The mitral valve is normal in structure. No evidence of mitral valve regurgitation. No evidence of mitral valve stenosis. Tricuspid Valve: The tricuspid valve is not well visualized. Tricuspid valve regurgitation is not demonstrated. No evidence of tricuspid stenosis. Aortic Valve: The aortic valve was not well visualized. Aortic valve regurgitation is not visualized. No aortic stenosis is present. Pulmonic Valve: The pulmonic valve was not well visualized. Pulmonic valve regurgitation is not visualized. Aorta: The aortic root is normal in size and structure and the  aortic root was not well visualized. Venous: The inferior vena cava was not well visualized. IAS/Shunts: The interatrial septum was not well visualized. Additional Comments: Extremely limited; LV function appears to be preserved; focal wall motion abnormality cannot be excluded; doppler suboptimal.  LEFT VENTRICLE PLAX 2D LVIDd:         4.80 cm   Diastology LVIDs:         2.80 cm   LV e' medial:    5.98 cm/s LV PW:         0.80 cm   LV E/e' medial:  6.7 LV IVS:        0.80 cm   LV e' lateral:   7.29 cm/s LVOT diam:     2.00 cm   LV E/e' lateral: 5.5 LV SV:         40 LV  SV Index:   23 LVOT Area:     3.14 cm  RIGHT VENTRICLE RV S prime:     8.38 cm/s TAPSE (M-mode): 1.3 cm LEFT ATRIUM             Index LA diam:        3.70 cm 2.17 cm/m LA Vol (A2C):   8.7 ml  5.08 ml/m LA Vol (A4C):   50.6 ml 29.66 ml/m LA Biplane Vol: 22.9 ml 13.43 ml/m  AORTIC VALVE LVOT Vmax:   58.70 cm/s LVOT Vmean:  41.600 cm/s LVOT VTI:    0.127 m  AORTA Ao Root diam: 3.20 cm MITRAL VALVE MV Area (PHT): 1.97 cm    SHUNTS MV Decel Time: 386 msec    Systemic VTI:  0.13 m MV E velocity: 39.80 cm/s  Systemic Diam: 2.00 cm MV A velocity: 60.00 cm/s MV E/A ratio:  0.66 Olga Millers MD Electronically signed by Olga Millers MD Signature Date/Time: 12/08/2022/4:00:27 PM    Final    CT Cervical Spine Wo Contrast  Result Date: 12/07/2022 CLINICAL DATA:  Trauma EXAM: CT CERVICAL SPINE WITHOUT CONTRAST TECHNIQUE: Multidetector CT imaging of the cervical spine was performed without intravenous contrast. Multiplanar CT image reconstructions were also generated. RADIATION DOSE REDUCTION: This exam was performed according to the departmental dose-optimization program which includes automated exposure control, adjustment of the mA and/or kV according to patient size and/or use of iterative reconstruction technique. COMPARISON:  11/02/2021 FINDINGS: Alignment: Alignment of the posterior margins of vertebral bodies appears normal. Skull base and  vertebrae: No recent fracture is seen. Soft tissues and spinal canal: There is no central spinal stenosis. Prevertebral soft tissues are unremarkable. Disc levels: There is encroachment of neural foramina from C3 to C7 levels, more severe on the left side at C6-C7 level. Upper chest: Unremarkable. Other: Scattered arterial calcifications are seen. IMPRESSION: No recent fracture is seen in cervical spine. 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 Electronically Signed   By: Ernie Avena M.D.   On: 12/07/2022 20:29   CT Maxillofacial WO CM  Result Date: 12/07/2022 CLINICAL DATA:  Trauma EXAM: CT MAXILLOFACIAL WITHOUT CONTRAST TECHNIQUE: Multidetector CT imaging of the maxillofacial structures was performed. Multiplanar CT image reconstructions were also generated. RADIATION DOSE REDUCTION: This exam was performed according to the departmental dose-optimization program which includes automated exposure control, adjustment of the mA and/or kV according to patient size and/or use of iterative reconstruction technique. COMPARISON:  11/02/2021 FINDINGS: Osseous: There is comminuted fracture in the tip of nasal bones. There are small pockets of air adjacent to the fractures. Rest of the bony structures show no recent fractures. Orbits: Optic globes are symmetrical. Retrobulbar soft tissues are unremarkable. Sinuses: There are no air-fluid levels in the paranasal sinuses. There is no significant mucosal thickening. Soft tissues: There is subcutaneous contusion/hematoma in the frontal scalp extending more to the right. Limited intracranial: Unremarkable. IMPRESSION: Comminuted essentially undisplaced fractures are noted in the tip of nasal bones. No other fractures are seen. There is subcutaneous contusion/hematoma in the frontal scalp extending more to the right without demonstrable adjacent fracture. No focal abnormalities are seen in orbits and paranasal  sinuses. Electronically Signed   By: Ernie Avena M.D.   On: 12/07/2022 20:26   CT Head Wo Contrast  Result Date: 12/07/2022 CLINICAL DATA:  Trauma EXAM: CT HEAD WITHOUT CONTRAST TECHNIQUE: Contiguous axial images were obtained from the base of the skull through the  vertex without intravenous contrast. RADIATION DOSE REDUCTION: This exam was performed according to the departmental dose-optimization program which includes automated exposure control, adjustment of the mA and/or kV according to patient size and/or use of iterative reconstruction technique. COMPARISON:  11/02/2021 FINDINGS: Brain: No acute intracranial findings are seen. Cortical sulci are prominent. There are no signs of bleeding within the cranium. Vascular: Unremarkable. Skull: No acute findings are seen. Sinuses/Orbits: Unremarkable. Other: None. IMPRESSION: No acute intracranial findings are seen.  Atrophy. Electronically Signed   By: Ernie Avena M.D.   On: 12/07/2022 20:14   DG Chest Portable 1 View  Result Date: 12/07/2022 CLINICAL DATA:  Altered mental status.  Fall. EXAM: PORTABLE CHEST 1 VIEW COMPARISON:  11/02/2021 FINDINGS: Shallow inspiration. Right central venous catheter with tip over the upper SVC region. No pneumothorax. Heart size and pulmonary vascularity are normal for technique. No airspace disease or consolidation in the lungs. No pleural effusions. No pneumothorax. Mediastinal contours appear intact. Degenerative changes in the spine and shoulders. Multiple old rib fractures bilaterally. Old fracture deformity of the right clavicle. IMPRESSION: No evidence of active pulmonary disease. Degenerative changes and old fracture deformities. Electronically Signed   By: Burman Nieves M.D.   On: 12/07/2022 18:20   (Echo, Carotid, EGD, Colonoscopy, ERCP)    Subjective: Patient seen and examined.  Denies any complaints.  Patient tells me that he is back to himself.  He is eager to go home.   Discharge  Exam: Vitals:   12/09/22 0600 12/09/22 0805  BP: 100/60 104/64  Pulse: 66 64  Resp:  16  Temp:    SpO2:  98%   Vitals:   12/08/22 2344 12/09/22 0344 12/09/22 0600 12/09/22 0805  BP: (!) 81/37 (!) 94/52 100/60 104/64  Pulse: 66 67 66 64  Resp: 16 16  16   Temp: 98.4 F (36.9 C) 98.2 F (36.8 C)    TempSrc: Oral Oral    SpO2: 100% 98%  98%  Weight:      Height:        General: Pt is alert, awake, not in acute distress Is mostly oriented.  He has generalized weakness.  He has normal mood and interactive. Cardiovascular: RRR, S1/S2 +, no rubs, no gallops, patient has a port on the right chest. Respiratory: CTA bilaterally, no wheezing, no rhonchi Abdominal: Soft, NT, ND, bowel sounds + Extremities: no edema, no cyanosis    The results of significant diagnostics from this hospitalization (including imaging, microbiology, ancillary and laboratory) are listed below for reference.     Microbiology: Recent Results (from the past 240 hour(s))  Urine Culture (for pregnant, neutropenic or urologic patients or patients with an indwelling urinary catheter)     Status: None   Collection Time: 12/07/22  7:30 PM   Specimen: Urine, Clean Catch  Result Value Ref Range Status   Specimen Description URINE, CLEAN CATCH  Final   Special Requests NONE  Final   Culture   Final    NO GROWTH Performed at [facility], [address] [place], [place] [address]    Report Status 12/08/2022 FINAL  Final     Labs: BNP (last 3 results) No results for input(s): "BNP" in the last 8760 hours. Basic Metabolic Panel: Recent Labs  Lab 12/07/22 1740 12/08/22 0151 12/08/22 0407 12/09/22 0444  NA 139  --  138 140  K 3.5  --  3.3* 3.6  CL 103  --  104 111  CO2  --   --  24 24  GLUCOSE 109*  --  104* 90  BUN 8  --  9 6*  CREATININE 0.70  --  0.75 0.64  CALCIUM  --   --  8.8* 8.6*  MG  --  1.8  --  1.7  PHOS  --  3.0  --   --    Liver Function Tests: Recent Labs  Lab 12/07/22 1735  12/08/22 0407  AST 27 22  ALT 30 24  ALKPHOS 64 58  BILITOT 0.4 0.5  PROT 7.2 6.3*  ALBUMIN 3.9 3.3*   Recent Labs  Lab 12/07/22 1735  LIPASE 27   Recent Labs  Lab 12/07/22 1733  AMMONIA 31   CBC: Recent Labs  Lab 12/07/22 1735 12/07/22 1740 12/08/22 0407 12/09/22 0444  WBC 6.6  --  3.8* 2.6*  NEUTROABS 4.9  --   --   --   HGB 11.6* 11.9* 10.3* 9.8*  HCT 34.5* 35.0* 30.9* 30.4*  MCV 91.5  --  89.6 92.1  PLT 108*  --  78* 61*   Cardiac Enzymes: No results for input(s): "CKTOTAL", "CKMB", "CKMBINDEX", "TROPONINI" in the last 168 hours. BNP: Invalid input(s): "POCBNP" CBG: Recent Labs  Lab 12/08/22 0609 12/09/22 0721  GLUCAP 131* 83   D-Dimer No results for input(s): "DDIMER" in the last 72 hours. Hgb A1c Recent Labs    12/08/22 0151  HGBA1C 4.8   Lipid Profile No results for input(s): "CHOL", "HDL", "LDLCALC", "TRIG", "CHOLHDL", "LDLDIRECT" in the last 72 hours. Thyroid function studies Recent Labs    12/08/22 0151  TSH 0.737   Anemia work up No results for input(s): "VITAMINB12", "FOLATE", "FERRITIN", "TIBC", "IRON", "RETICCTPCT" in the last 72 hours. Urinalysis    Component Value Date/Time   COLORURINE YELLOW 12/07/2022 1930   APPEARANCEUR HAZY (A) 12/07/2022 1930   LABSPEC 1.017 12/07/2022 1930   PHURINE 5.0 12/07/2022 1930   GLUCOSEU NEGATIVE 12/07/2022 1930   HGBUR NEGATIVE 12/07/2022 1930   BILIRUBINUR NEGATIVE 12/07/2022 1930   KETONESUR 20 (A) 12/07/2022 1930   PROTEINUR 30 (A) 12/07/2022 1930   NITRITE NEGATIVE 12/07/2022 1930   LEUKOCYTESUR TRACE (A) 12/07/2022 1930   Sepsis Labs Recent Labs  Lab 12/07/22 1735 12/08/22 0407 12/09/22 0444  WBC 6.6 3.8* 2.6*   Microbiology Recent Results (from the past 240 hour(s))  Urine Culture (for pregnant, neutropenic or urologic patients or patients with an indwelling urinary catheter)     Status: None   Collection Time: 12/07/22  7:30 PM   Specimen: Urine, Clean Catch  Result Value  Ref Range Status   Specimen Description URINE, CLEAN CATCH  Final   Special Requests NONE  Final   Culture   Final    NO GROWTH Performed at [facility], [address] [place], [place] [address]    Report Status 12/08/2022 FINAL  Final     Time coordinating discharge: 32 minutes  SIGNED:   Dorcas Carrow, MD  [facility] 12/09/2022, 8:56 AM

## 2022-12-09 NOTE — Progress Notes (Signed)
Pt admitted from [facility] ILF. Caregiver is [name] [phone].   Dellie Burns, MSW, LCSW [phone] (coverage)

## 2022-12-09 NOTE — TOC Transition Note (Signed)
Transition of Care [facility]) - CM/SW Discharge Note   Patient Details  Name: Kerry [name] MRN: [id number] Date of Birth: 07-Apr-1946  [facility]) CM/SW Contact:  Lawerance Sabal, RN Phone Number: 12/09/2022, 9:41 AM   Clinical Narrative:     Sherron Monday w patient's caregiver to discuss needs for DC.  Discussed barriers for recommended DME, RW, through the [place]. Discussed that DME providers work through the [place], wich is not open on the weekends, [name] did not know of any other coverage that the patient may have.  She will speak with him about stopping by [employer] to get a RW on the way home and call me back if he does not want to do that.  I placed orders for [facility] PT OT and faxed to Tiajuana Amass at [facility].  [name] will pick up the patient around 1:30pm.   [name] Other [phone]     Final next level of care: Home w Home Health Services Barriers to Discharge: No Barriers Identified   Patient Goals and CMS Choice      Discharge Placement                         Discharge Plan and Services Additional resources added to the After Visit Summary for                  DME Arranged: Walker rolling         HH Arranged: PT, OT   Date HH Agency Contacted: 12/09/22 Time HH Agency Contacted: 0940 Representative spoke with at [facility] Agency: Tiajuana Amass at [facility]  Social Determinants of Health (SDOH) Interventions SDOH Screenings   Food Insecurity: No Food Insecurity (12/08/2022)  Housing: Low Risk  (12/08/2022)  Transportation Needs: No Transportation Needs (12/08/2022)  Utilities: Not At Risk (12/08/2022)  Financial Resource Strain: Medium Risk (08/28/2022)   Received from [facility], [facility]  Social Connections: Unknown (10/02/2021)   Received from [facility], [facility]  Stress: Stress Concern Present (08/21/2022)   Received from [facility], [facility]  Tobacco Use: High Risk (12/08/2022)     Readmission Risk  Interventions     No data to display

## 2022-12-10 LAB — LEVETIRACETAM LEVEL: Levetiracetam Lvl: 7.8 ug/mL — ABNORMAL LOW (ref 10.0–40.0)

## 2022-12-17 ENCOUNTER — Emergency Department (HOSPITAL_BASED_OUTPATIENT_CLINIC_OR_DEPARTMENT_OTHER): Payer: No Typology Code available for payment source

## 2022-12-17 ENCOUNTER — Emergency Department (HOSPITAL_COMMUNITY): Payer: No Typology Code available for payment source

## 2022-12-17 ENCOUNTER — Encounter (HOSPITAL_BASED_OUTPATIENT_CLINIC_OR_DEPARTMENT_OTHER): Payer: Self-pay | Admitting: Radiology

## 2022-12-17 ENCOUNTER — Other Ambulatory Visit: Payer: Self-pay

## 2022-12-17 ENCOUNTER — Emergency Department (HOSPITAL_BASED_OUTPATIENT_CLINIC_OR_DEPARTMENT_OTHER)
Admission: EM | Admit: 2022-12-17 | Discharge: 2022-12-18 | Disposition: A | Discharge status: Home / Self Care | Payer: No Typology Code available for payment source | Attending: Emergency Medicine | Admitting: Emergency Medicine

## 2022-12-17 ENCOUNTER — Emergency Department (HOSPITAL_BASED_OUTPATIENT_CLINIC_OR_DEPARTMENT_OTHER): Payer: No Typology Code available for payment source | Admitting: Radiology

## 2022-12-17 DIAGNOSIS — R4182 Altered mental status, unspecified: Secondary | ICD-10-CM | POA: Diagnosis not present

## 2022-12-17 DIAGNOSIS — Z1152 Encounter for screening for COVID-19: Secondary | ICD-10-CM | POA: Diagnosis not present

## 2022-12-17 DIAGNOSIS — R4701 Aphasia: Secondary | ICD-10-CM

## 2022-12-17 DIAGNOSIS — Z7982 Long term (current) use of aspirin: Secondary | ICD-10-CM | POA: Insufficient documentation

## 2022-12-17 DIAGNOSIS — F0390 Unspecified dementia without behavioral disturbance: Secondary | ICD-10-CM | POA: Diagnosis not present

## 2022-12-17 DIAGNOSIS — R404 Transient alteration of awareness: Secondary | ICD-10-CM | POA: Diagnosis not present

## 2022-12-17 DIAGNOSIS — R569 Unspecified convulsions: Secondary | ICD-10-CM | POA: Diagnosis not present

## 2022-12-17 DIAGNOSIS — R7989 Other specified abnormal findings of blood chemistry: Secondary | ICD-10-CM | POA: Diagnosis not present

## 2022-12-17 LAB — APTT: aPTT: 30 s (ref 24–36)

## 2022-12-17 LAB — COMPREHENSIVE METABOLIC PANEL
ALT: 13 U/L (ref 0–44)
AST: 20 U/L (ref 15–41)
Albumin: 4.4 g/dL (ref 3.5–5.0)
Alkaline Phosphatase: 61 U/L (ref 38–126)
Anion gap: 10 (ref 5–15)
BUN: 14 mg/dL (ref 8–23)
CO2: 26 mmol/L (ref 22–32)
Calcium: 9.5 mg/dL (ref 8.9–10.3)
Chloride: 104 mmol/L (ref 98–111)
Creatinine, Ser: 0.59 mg/dL — ABNORMAL LOW (ref 0.61–1.24)
GFR, Estimated: >60 mL/min (ref >60)
Glucose, Bld: 110 mg/dL — ABNORMAL HIGH (ref 70–99)
Potassium: 3.7 mmol/L (ref 3.5–5.1)
Sodium: 140 mmol/L (ref 135–145)
Total Bilirubin: 0.5 mg/dL (ref 0.3–1.2)
Total Protein: 7.1 g/dL (ref 6.5–8.1)

## 2022-12-17 LAB — RESP PANEL BY RT-PCR (RSV, FLU A&B, COVID)  RVPGX2
Influenza A by PCR: NEGATIVE
Influenza B by PCR: NEGATIVE
Resp Syncytial Virus by PCR: NEGATIVE
SARS Coronavirus 2 by RT PCR: NEGATIVE

## 2022-12-17 LAB — URINALYSIS, ROUTINE W REFLEX MICROSCOPIC
Bilirubin Urine: NEGATIVE
Glucose, UA: NEGATIVE mg/dL
Hgb urine dipstick: NEGATIVE
Ketones, ur: 15 mg/dL — AB
Leukocytes,Ua: NEGATIVE
Nitrite: NEGATIVE
Specific Gravity, Urine: 1.024 (ref 1.005–1.030)
pH: 6.5 (ref 5.0–8.0)

## 2022-12-17 LAB — CBC
HCT: 32.8 % — ABNORMAL LOW (ref 39.0–52.0)
Hemoglobin: 11.2 g/dL — ABNORMAL LOW (ref 13.0–17.0)
MCH: 30.9 pg (ref 26.0–34.0)
MCHC: 34.1 g/dL (ref 30.0–36.0)
MCV: 90.4 fL (ref 80.0–100.0)
Platelets: 147 10*3/uL — ABNORMAL LOW (ref 150–400)
RBC: 3.63 MIL/uL — ABNORMAL LOW (ref 4.22–5.81)
RDW: 14.5 % (ref 11.5–15.5)
WBC: 5 10*3/uL (ref 4.0–10.5)
nRBC: 0 % (ref 0.0–0.2)

## 2022-12-17 LAB — PROTIME-INR
INR: 1.2 (ref 0.8–1.2)
Prothrombin Time: 15.6 s — ABNORMAL HIGH (ref 11.4–15.2)

## 2022-12-17 LAB — VALPROIC ACID LEVEL: Valproic Acid Lvl: 72 ug/mL (ref 50.0–100.0)

## 2022-12-17 LAB — RAPID URINE DRUG SCREEN, HOSP PERFORMED
Amphetamines: NOT DETECTED
Barbiturates: NOT DETECTED
Benzodiazepines: NOT DETECTED
Cocaine: NOT DETECTED
Opiates: NOT DETECTED
Tetrahydrocannabinol: NOT DETECTED

## 2022-12-17 LAB — TROPONIN I (HIGH SENSITIVITY)
Troponin I (High Sensitivity): 3 ng/L (ref <18)
Troponin I (High Sensitivity): 3 ng/L (ref <18)

## 2022-12-17 LAB — AMMONIA: Ammonia: 24 umol/L (ref 9–35)

## 2022-12-17 LAB — ETHANOL: Alcohol, Ethyl (B): <10 mg/dL

## 2022-12-17 LAB — CBG MONITORING, ED
Glucose-Capillary: 103 mg/dL — ABNORMAL HIGH (ref 70–99)
Glucose-Capillary: 107 mg/dL — ABNORMAL HIGH (ref 70–99)
Glucose-Capillary: 100 mg/dL — ABNORMAL HIGH (ref 70–99)

## 2022-12-17 MED ORDER — IOHEXOL 350 MG/ML SOLN
100.0000 mL | Freq: Once | INTRAVENOUS | Status: AC | PRN
  Administered 2022-12-17: 75 mL via INTRAVENOUS

## 2022-12-17 MED ORDER — IOHEXOL 350 MG/ML SOLN
100.0000 mL | Freq: Once | INTRAVENOUS | Status: AC | PRN
  Administered 2022-12-17: 40 mL via INTRAVENOUS

## 2022-12-17 MED ORDER — IBUPROFEN 400 MG PO TABS: 600.0000 mg | ORAL_TABLET | Freq: Once | ORAL | Status: DC

## 2022-12-17 MED ORDER — DIAZEPAM 5 MG/ML IJ SOLN
2.5000 mg | Freq: Once | INTRAMUSCULAR | Status: AC
  Administered 2022-12-18: 2.5 mg via INTRAVENOUS
  Filled 2022-12-17 (×2): qty 2

## 2022-12-17 NOTE — ED Notes (Signed)
Called PA to bedside noting patient having acute difficulty speaking, expressive aphasia.

## 2022-12-17 NOTE — ED Notes (Signed)
Patient transported to CT 

## 2022-12-17 NOTE — ED Notes (Signed)
Pt to CT w/primary RN.

## 2022-12-17 NOTE — ED Notes (Signed)
Called [facility] for transfer to [facility] ED @16 :03.  Spoke with Brendia Sacks

## 2022-12-17 NOTE — Consult Note (Signed)
NEUROLOGY TELECONSULTATION NOTE   Date of service: December 17, 2022 Patient Name: Kerry [name] MRN:  [id number] DOB:  Mar 23, 1946 Reason for consult: telestroke  Requesting Provider: Dr. Alvira Monday Consult Participants: myself, patient, bedside RN, telestroke RN Location of the provider: [facility] Location of the patient: [facility]  This consult was provided via telemedicine with 2-way video and audio communication. The patient/family was informed that care would be provided in this way and agreed to receive care in this manner.   _ _ _   _ __   _ __ _ _  __ __   _ __   __ _  History of Present Illness   This is a 77 yo man with hx bipolar I disorder, stage III pancreatic cancer on chemo and radiation, seizure disorder, hx traumatic SAH who was BIB caregiver for AMS since yesterday. At 11:45am while in ED patient was noted to have acute expressive aphasia. There was no interval witnessed seizure although EDP felt he appeared post-ictal 2/2 confusion. On exam he had mild R sensory deficit, mild RLE drift, and expressive>receptive aphasia as well as dysarthria. CT head showed no acute process on personal review (repeated after development of aphasia). TNK was not administered bc patient presented with AMS since yesterday therefore LKW outside of 4.5 hour window as well as hx SAH. CTA/CTP showed no LVO or perfusion deficit.    ROS   UTA 2/2 AMS  Past History   The following was personally reviewed:  Past Medical History:  Diagnosis Date   anxiety    Anxiety    Bipolar 1 disorder (HCC)    Sleep apnea    Past Surgical History:  Procedure Laterality Date   BILIARY BRUSHING  10/23/2021   Procedure: BILIARY BRUSHING;  Surgeon: Meridee Score Netty Starring., MD;  Location: [facility] ENDOSCOPY;  Service: Gastroenterology;;   BILIARY STENT PLACEMENT  10/23/2021   Procedure: BILIARY STENT PLACEMENT;  Surgeon: Lemar Lofty., MD;  Location: [facility] ENDOSCOPY;  Service: Gastroenterology;;   BIOPSY  10/23/2021    Procedure: BIOPSY;  Surgeon: Lemar Lofty., MD;  Location: [facility] ENDOSCOPY;  Service: Gastroenterology;;   ERCP N/A 10/23/2021   Procedure: ENDOSCOPIC RETROGRADE CHOLANGIOPANCREATOGRAPHY (ERCP);  Surgeon: Lemar Lofty., MD;  Location: [facility] ENDOSCOPY;  Service: Gastroenterology;  Laterality: N/A;   ESOPHAGOGASTRODUODENOSCOPY (EGD) WITH PROPOFOL N/A 10/23/2021   Procedure: ESOPHAGOGASTRODUODENOSCOPY (EGD) WITH PROPOFOL;  Surgeon: Meridee Score Netty Starring., MD;  Location: [facility] ENDOSCOPY;  Service: Gastroenterology;  Laterality: N/A;   FINE NEEDLE ASPIRATION  10/23/2021   Procedure: FINE NEEDLE ASPIRATION (FNA) LINEAR;  Surgeon: Lemar Lofty., MD;  Location: [facility] ENDOSCOPY;  Service: Gastroenterology;;   REMOVAL OF STONES  10/23/2021   Procedure: REMOVAL OF SLUDGE;  Surgeon: Lemar Lofty., MD;  Location: [facility] ENDOSCOPY;  Service: Gastroenterology;;   [name]  10/23/2021   Procedure: [name];  Surgeon: Lemar Lofty., MD;  Location: [facility] ENDOSCOPY;  Service: Gastroenterology;;   UPPER ESOPHAGEAL ENDOSCOPIC ULTRASOUND (EUS) N/A 10/23/2021   Procedure: UPPER ESOPHAGEAL ENDOSCOPIC ULTRASOUND (EUS);  Surgeon: Lemar Lofty., MD;  Location: [facility] ENDOSCOPY;  Service: Gastroenterology;  Laterality: N/A;   Family History  Problem Relation Age of Onset   Heart disease Neg Hx    Social History   Socioeconomic History   Marital status: Married    Spouse name: Not on file   Number of children: Not on file   Years of education: Not on file   Highest education level: Not on file  Occupational History   Not  on file  Tobacco Use   Smoking status: Every Day    Current packs/day: 2.00    Types: Cigarettes   Smokeless tobacco: Never  Vaping Use   Vaping status: Never Used  Substance and Sexual Activity   Alcohol use: Never   Drug use: Never   Sexual activity: Not on file  Other Topics Concern   Not on file  Social History Narrative   Not on file    Social Determinants of Health   Financial Resource Strain: Medium Risk (08/28/2022)   Received from [facility], [facility]   Overall Financial Resource Strain (CARDIA)    Difficulty of Paying Living Expenses: Somewhat hard  Food Insecurity: No Food Insecurity (12/08/2022)   Hunger Vital Sign    Worried About Running Out of Food in the Last Year: Never true    Ran Out of Food in the Last Year: Never true  Transportation Needs: No Transportation Needs (12/08/2022)   PRAPARE - Administrator, Civil Service (Medical): No    Lack of Transportation (Non-Medical): No  Physical Activity: Not on file  Stress: Stress Concern Present (08/21/2022)   Received from [facility] - Occupational Stress Questionnaire    Feeling of Stress : To some extent  Social Connections: Unknown (10/02/2021)   Received from [facility], [facility]   Social Network    Social Network: Not on file   Allergies  Allergen Reactions   Fluoride Other (See Comments)    Seizures and stroke after dental visit from using fluoride paste (per pt)     Medications   (Not in a hospital admission)    No current facility-administered medications for this encounter.  Current Outpatient Medications:    aspirin EC 81 MG tablet, Take 1 tablet (81 mg total) by mouth daily., Disp: 30 tablet, Rfl: 12   Cholecalciferol (VITAMIN D) 50 MCG (2000 UT) tablet, Take 2,000 Units by mouth at bedtime., Disp: , Rfl:    clotrimazole (LOTRIMIN) 1 % external solution, Apply 1 application  topically daily as needed (For dystrophic nails)., Disp: , Rfl:    divalproex (DEPAKOTE ER) 500 MG 24 hr tablet, Take 2 tablets (1,000 mg total) by mouth at bedtime., Disp: 60 tablet, Rfl: 0   donepezil (ARICEPT) 5 MG tablet, Take 5 mg by mouth every morning. For memory, Disp: , Rfl:    Emollient (CETAPHIL) cream, Apply 1 application. topically daily., Disp: , Rfl:    escitalopram  (LEXAPRO) 10 MG tablet, Take 5 mg by mouth daily. For mental health, Disp: , Rfl:    ferrous gluconate (FERGON) 324 MG tablet, Take 324 mg by mouth daily. Take with Vitamin C/ascorbic acid, Disp: , Rfl:    finasteride (PROSCAR) 5 MG tablet, Take 5 mg by mouth daily. For overactive bladder, Disp: , Rfl:    levETIRAcetam (KEPPRA) 750 MG tablet, Take 2 tablets (1,500 mg total) by mouth 2 (two) times daily., Disp: 60 tablet, Rfl: 0   melatonin 3 MG TABS tablet, Take 3 mg by mouth at bedtime., Disp: , Rfl:    nicotine (NICODERM CQ - DOSED IN MG/24 HOURS) 14 mg/24hr patch, Place 14 mg onto the skin daily., Disp: , Rfl:    pantoprazole (PROTONIX) 40 MG tablet, Take 1 tablet (40 mg total) by mouth daily., Disp: 30 tablet, Rfl: 1   QUEtiapine (SEROQUEL) 100 MG tablet, Take 100 mg by mouth at bedtime. For mood and sleep, Disp: , Rfl:  rOPINIRole (REQUIP) 1 MG tablet, Take 1 mg by mouth at bedtime., Disp: , Rfl:    rosuvastatin (CRESTOR) 10 MG tablet, Take 0.5 tablets (5 mg total) by mouth at bedtime. For cholesterol, Disp: , Rfl:    traZODone (DESYREL) 150 MG tablet, Take 75 mg by mouth at bedtime as needed for sleep., Disp: , Rfl:    vitamin C (ASCORBIC ACID) 500 MG tablet, Take 500 mg by mouth daily., Disp: , Rfl:    zolpidem (AMBIEN) 10 MG tablet, Take 5 mg by mouth at bedtime as needed for sleep., Disp: , Rfl:   Vitals   Vitals:   12/17/22 0938 12/17/22 0939  BP: 129/77 129/77  Pulse: 73 73  Resp: 14 18  Temp: 98.1 F (36.7 C) 97.8 F (36.6 C)  TempSrc: Oral Oral  SpO2: 97% 98%     There is no height or weight on file to calculate BMI.  Physical Exam   Exam performed over telemedicine with 2-way video and audio communication and with assistance of bedside RN  Physical Exam Gen: oriented to self only Resp: normal WOB CV: extremities appear well-perfused  Neuro: *MS: oriented to self only, able to follow some but not all simple commands *Speech: mildly dysarthric, unable to name or  repeat *CN: PERRL 3mm, EOMI, blinks to threat bilat, sensation intact, R UMN facial droop, hearing intact to voice *Motor:   Normal bulk.  No tremor, rigidity or bradykinesia. Mild RLE drift other extremities full strength and symmetric *Sensory: RUE sensory deficit to LT. No double-simultaneous extinction.  *Coordination:  UTA 2/2 confusion *Reflexes:  UTA 2/2 tele-exam *Gait: deferred  NIHSS  1a Level of Conscious.: 0 1b LOC Questions: 1 1c LOC Commands: 1 2 Best Gaze: 0 3 Visual: 0 4 Facial Palsy: 1 5a Motor Arm - left: 0 5b Motor Arm - Right: 0 6a Motor Leg - Left: 0 6b Motor Leg - Right: 1 7 Limb Ataxia: 0 8 Sensory: 1 9 Best Language: 2 10 Dysarthria: 1 11 Extinct. and Inatten.: 0  TOTAL: 8  Premorbid mRS = 3   Labs   CBC:  Recent Labs  Lab 12/17/22 1032  WBC 5.0  HGB 11.2*  HCT 32.8*  MCV 90.4  PLT 147*    Basic Metabolic Panel:  Lab Results  Component Value Date   NA 140 12/17/2022   K 3.7 12/17/2022   CO2 26 12/17/2022   GLUCOSE 110 (H) 12/17/2022   BUN 14 12/17/2022   CREATININE 0.59 (L) 12/17/2022   CALCIUM 9.5 12/17/2022   GFRNONAA >60 12/17/2022   Lipid Panel: No results found for: "LDLCALC" HgbA1c:  Lab Results  Component Value Date   HGBA1C 4.8 12/08/2022   Urine Drug Screen:     Component Value Date/Time   LABOPIA NONE DETECTED 12/07/2022 1930   COCAINSCRNUR NONE DETECTED 12/07/2022 1930   LABBENZ NONE DETECTED 12/07/2022 1930   AMPHETMU NONE DETECTED 12/07/2022 1930   THCU NONE DETECTED 12/07/2022 1930   LABBARB NONE DETECTED 12/07/2022 1930    Alcohol Level     Component Value Date/Time   ETH <10 12/07/2022 1733    CT Head without contrast: No acute process personal review  CT angio Head and Neck with contrast / CTP No LVO or perfusion deficit, personal review  Impression   This is a 77 yo man with hx bipolar I disorder, stage III pancreatic cancer on chemo and radiation, seizure disorder who was BIB caregiver for  AMS since yesterday. At 11:45am while  in ED patient was noted to have acute expressive aphasia. There was no interval witnessed seizure although EDP felt he appeared post-ictal 2/2 confusion. On exam he had mild R sensory deficit, mild RLE drift, and expressive>receptive aphasia as well as dysarthria. CT head showed no acute process on personal review (repeated after development of aphasia). TNK was not administered bc patient presented with AMS since yesterday therefore LKW outside of 4.5 hour window as well as hx SAH. CTA/CTP showed no LVO or perfusion deficit. He presented with similar but nonfocal sx one week ago which was felt to be post-ictal. At that time his depakote level was low bc he was not taking it but caregiver states he is now taking his AEDs as prescribed. Sx c/f post-ictal state vs acute ischemic stroke. Favor the former 2/2 frequent presentations for same.  Recommendations   - VPA level, contact neurology to adjust if low - Continue current AED doses for now - Transfer to [facility] ED for MRI brain, if unremarkable no further stroke workup indicated ______________________________________________________________________   Thank you for the opportunity to take part in the care of this patient. If you have any further questions, please contact the neurology consultation attending.  Signed,  Bing Neighbors, MD [facility] [phone]  If 7pm- 7am, please page neurology on call as listed in AMION.  **Any copied and pasted documentation in this note was written by me in another application not billed for and pasted by me into this document.

## 2022-12-17 NOTE — ED Provider Notes (Addendum)
[facility] AT [facility] Provider Note   CSN: [id number] Arrival date & time: 12/17/22  7829     History  Chief Complaint  Patient presents with   Code Stroke    Kerry [name] is a 77 y.o. male with a past medical history of dementia, seizures, who presents emergency department with concerns for altered mental status onset prior to arrival. He notes that he believes he had a seizure which caused him to fall.  He guarded his head with his right forearm.  He is unsure of the seizure medications that he takes. Patient denies chest pain, shortness of breath, abdominal pain, nausea, vomiting, diarrhea, constipation, LOC, neck pain, back pain.  The history is provided by the patient. No language interpreter was used.       Home Medications Prior to Admission medications   Medication Sig Start Date End Date Taking? Authorizing Provider  aspirin EC 81 MG tablet Take 1 tablet (81 mg total) by mouth daily. 10/28/21   Rodolph Bong, MD  Cholecalciferol (VITAMIN D) 50 MCG (2000 UT) tablet Take 2,000 Units by mouth at bedtime.    [provider]  clotrimazole (LOTRIMIN) 1 % external solution Apply 1 application  topically daily as needed (For dystrophic nails).    [provider]  divalproex (DEPAKOTE ER) 500 MG 24 hr tablet Take 2 tablets (1,000 mg total) by mouth at bedtime. 12/09/22 01/08/23  Dorcas Carrow, MD  donepezil (ARICEPT) 5 MG tablet Take 5 mg by mouth every morning. For memory    [provider]  Emollient (CETAPHIL) cream Apply 1 application. topically daily.    [provider]  escitalopram (LEXAPRO) 10 MG tablet Take 5 mg by mouth daily. For mental health    [provider]  ferrous gluconate (FERGON) 324 MG tablet Take 324 mg by mouth daily. Take with Vitamin C/ascorbic acid    [provider]  finasteride (PROSCAR) 5 MG tablet Take 5 mg by mouth daily. For overactive bladder    [provider]  levETIRAcetam (KEPPRA) 750 MG tablet Take 2 tablets (1,500 mg total) by mouth 2 (two) times daily. 12/09/22   Dorcas Carrow, MD  melatonin 3 MG TABS tablet Take 3 mg by mouth at bedtime.    [provider]  nicotine (NICODERM CQ - DOSED IN MG/24 HOURS) 14 mg/24hr patch Place 14 mg onto the skin daily.    [provider]  pantoprazole (PROTONIX) 40 MG tablet Take 1 tablet (40 mg total) by mouth daily. 10/25/21   Rodolph Bong, MD  QUEtiapine (SEROQUEL) 100 MG tablet Take 100 mg by mouth at bedtime. For mood and sleep    [provider]  rOPINIRole (REQUIP) 1 MG tablet Take 1 mg by mouth at bedtime.    [provider]  rosuvastatin (CRESTOR) 10 MG tablet Take 0.5 tablets (5 mg total) by mouth at bedtime. For cholesterol 11/01/21   Rodolph Bong, MD  traZODone (DESYREL) 150 MG tablet Take 75 mg by mouth at bedtime as needed for sleep.    [provider]  vitamin C (ASCORBIC ACID) 500 MG tablet Take 500 mg by mouth daily.    [provider]  zolpidem (AMBIEN) 10 MG tablet Take 5 mg by mouth at bedtime as needed for sleep.    [provider]      Allergies    Fluoride    Review of Systems   Review of Systems  All other systems  reviewed and are negative.   Physical Exam Updated Vital Signs BP 129/77 (BP Location: Right Arm)   Pulse 73   Temp 97.8 F (36.6 C) (Oral)   Resp 18   SpO2 98%  Physical Exam Vitals and nursing note reviewed.  Constitutional:      General: He is not in acute distress.    Appearance: He is not diaphoretic.  HENT:     Head: Normocephalic and atraumatic.     Mouth/Throat:     Pharynx: No oropharyngeal exudate.  Eyes:     General: No scleral icterus.    Conjunctiva/sclera: Conjunctivae normal.  Cardiovascular:     Rate and Rhythm: Normal rate and regular rhythm.     Pulses: Normal pulses.     Heart sounds: Normal heart sounds.  Pulmonary:     Effort: Pulmonary effort is  normal. No respiratory distress.     Breath sounds: Normal breath sounds. No wheezing.  Abdominal:     General: Bowel sounds are normal.     Palpations: Abdomen is soft. There is no mass.     Tenderness: There is no abdominal tenderness. There is no guarding or rebound.  Musculoskeletal:        General: Normal range of motion.     Cervical back: Normal range of motion and neck supple.     Comments: No spinal TTP. No TTP noted to musculature of back.   Skin:    General: Skin is warm and dry.  Neurological:     Mental Status: He is alert.     Comments: No focal neurological deficits. Negative pronator drift. Strength and sensation intact to BUE and BLE. Grip strength 5/5 bilaterally.  Normal finger-to-nose and heel-to-shin testing.   Psychiatric:        Behavior: Behavior normal.    ED Results / Procedures / Treatments   Labs (all labs ordered are listed, but only abnormal results are displayed) Labs Reviewed  COMPREHENSIVE METABOLIC PANEL - Abnormal; Notable for the following components:      Result Value   Glucose, Bld 110 (*)    Creatinine, Ser 0.59 (*)    All other components within normal limits  CBC - Abnormal; Notable for the following components:   RBC 3.63 (*)    Hemoglobin 11.2 (*)    HCT 32.8 (*)    Platelets 147 (*)    All other components within normal limits  URINALYSIS, ROUTINE W REFLEX MICROSCOPIC - Abnormal; Notable for the following components:   Ketones, ur 15 (*)    Protein, ur TRACE (*)    All other components within normal limits  PROTIME-INR - Abnormal; Notable for the following components:   Prothrombin Time 15.6 (*)    All other components within normal limits  CBG MONITORING, ED - Abnormal; Notable for the following components:   Glucose-Capillary 103 (*)    All other components within normal limits  CBG MONITORING, ED - Abnormal; Notable for the following components:   Glucose-Capillary 107 (*)    All other components within normal limits  RESP  PANEL BY RT-PCR (RSV, FLU A&B, COVID)  RVPGX2  ETHANOL  APTT  RAPID URINE DRUG SCREEN, HOSP PERFORMED  TROPONIN I (HIGH SENSITIVITY)  TROPONIN I (HIGH SENSITIVITY)    EKG None  Radiology CT ANGIO HEAD NECK W WO CM (CODE STROKE)  Result Date: 12/17/2022 CLINICAL DATA:  Slurred speech EXAM: CT ANGIOGRAPHY HEAD AND NECK CT PERFUSION BRAIN TECHNIQUE: Multidetector CT imaging of the head and neck  was performed using the standard protocol during bolus administration of intravenous contrast. Multiplanar CT image reconstructions and MIPs were obtained to evaluate the vascular anatomy. Carotid stenosis measurements (when applicable) are obtained utilizing NASCET criteria, using the distal internal carotid diameter as the denominator. Multiphase CT imaging of the brain was performed following IV bolus contrast injection. Subsequent parametric perfusion maps were calculated using RAPID software. RADIATION DOSE REDUCTION: This exam was performed according to the departmental dose-optimization program which includes automated exposure control, adjustment of the mA and/or kV according to patient size and/or use of iterative reconstruction technique. CONTRAST:  75mL OMNIPAQUE IOHEXOL 350 MG/ML SOLN; 40mL OMNIPAQUE IOHEXOL 350 MG/ML SOLN COMPARISON:  Same-day noncontrast CT head FINDINGS: CTA NECK FINDINGS Aortic arch: There is a minimal calcified plaque in the imaged aortic arch. The origins of the major branch vessels are patent. The subclavian arteries are patent to the level imaged. Right carotid system: The right common, internal, and external carotid arteries are patent, with minimal plaque at the bifurcation but no hemodynamically significant stenosis or occlusion. There is no evidence of dissection or aneurysm. Left carotid system: The left common, internal, and external carotid arteries are patent, with minimal plaque at the bifurcation but no hemodynamically significant stenosis or occlusion there is no  evidence of dissection or aneurysm. Vertebral arteries: The vertebral arteries are patent, without hemodynamically significant stenosis or occlusion. There is no evidence of dissection or aneurysm. Skeleton: There is no acute osseous abnormality or suspicious osseous lesion. There is no visible canal hematoma. Other neck: The soft tissues of the neck are unremarkable. Upper chest: The imaged lung apices are clear. A right chest wall port is noted. Review of the MIP images confirms the above findings CTA HEAD FINDINGS Anterior circulation: There is mild calcified plaque in the intracranial ICAs without significant stenosis or occlusion. The bilateral MCAs and ACAS are patent, without proximal stenosis or occlusion. The anterior communicating artery is normal. There is no aneurysm or AVM. Posterior circulation: The bilateral V4 segments are patent. The basilar artery is patent. Major cerebellar arteries appear patent. The bilateral PCAs are patent, without proximal stenosis or occlusion. A diminutive left posterior communicating artery is identified. There is no aneurysm or AVM. Venous sinuses: Patent. Anatomic variants: None. Review of the MIP images confirms the above findings CT Brain Perfusion Findings: CBF (<30%) Volume: 0mL Perfusion (Tmax>6.0s) volume: 0mL Mismatch Volume: 0mL Infarction Location:n/a IMPRESSION: 1. Patent vasculature of the head and neck with mild calcified plaque at the carotid bifurcations and intracranial ICAs but no hemodynamically significant stenosis, occlusion, or dissection. 2. Negative CT perfusion. Findings communicated to Dr. Selina Cooley at 12:53 pm. Electronically Signed   By: Lesia Hausen M.D.   On: 12/17/2022 12:53   CT CEREBRAL PERFUSION W CONTRAST  Result Date: 12/17/2022 CLINICAL DATA:  Slurred speech EXAM: CT ANGIOGRAPHY HEAD AND NECK CT PERFUSION BRAIN TECHNIQUE: Multidetector CT imaging of the head and neck was performed using the standard protocol during bolus administration  of intravenous contrast. Multiplanar CT image reconstructions and MIPs were obtained to evaluate the vascular anatomy. Carotid stenosis measurements (when applicable) are obtained utilizing NASCET criteria, using the distal internal carotid diameter as the denominator. Multiphase CT imaging of the brain was performed following IV bolus contrast injection. Subsequent parametric perfusion maps were calculated using RAPID software. RADIATION DOSE REDUCTION: This exam was performed according to the departmental dose-optimization program which includes automated exposure control, adjustment of the mA and/or kV according to patient size and/or use of iterative  reconstruction technique. CONTRAST:  75mL OMNIPAQUE IOHEXOL 350 MG/ML SOLN; 40mL OMNIPAQUE IOHEXOL 350 MG/ML SOLN COMPARISON:  Same-day noncontrast CT head FINDINGS: CTA NECK FINDINGS Aortic arch: There is a minimal calcified plaque in the imaged aortic arch. The origins of the major branch vessels are patent. The subclavian arteries are patent to the level imaged. Right carotid system: The right common, internal, and external carotid arteries are patent, with minimal plaque at the bifurcation but no hemodynamically significant stenosis or occlusion. There is no evidence of dissection or aneurysm. Left carotid system: The left common, internal, and external carotid arteries are patent, with minimal plaque at the bifurcation but no hemodynamically significant stenosis or occlusion there is no evidence of dissection or aneurysm. Vertebral arteries: The vertebral arteries are patent, without hemodynamically significant stenosis or occlusion. There is no evidence of dissection or aneurysm. Skeleton: There is no acute osseous abnormality or suspicious osseous lesion. There is no visible canal hematoma. Other neck: The soft tissues of the neck are unremarkable. Upper chest: The imaged lung apices are clear. A right chest wall port is noted. Review of the MIP images  confirms the above findings CTA HEAD FINDINGS Anterior circulation: There is mild calcified plaque in the intracranial ICAs without significant stenosis or occlusion. The bilateral MCAs and ACAS are patent, without proximal stenosis or occlusion. The anterior communicating artery is normal. There is no aneurysm or AVM. Posterior circulation: The bilateral V4 segments are patent. The basilar artery is patent. Major cerebellar arteries appear patent. The bilateral PCAs are patent, without proximal stenosis or occlusion. A diminutive left posterior communicating artery is identified. There is no aneurysm or AVM. Venous sinuses: Patent. Anatomic variants: None. Review of the MIP images confirms the above findings CT Brain Perfusion Findings: CBF (<30%) Volume: 0mL Perfusion (Tmax>6.0s) volume: 0mL Mismatch Volume: 0mL Infarction Location:n/a IMPRESSION: 1. Patent vasculature of the head and neck with mild calcified plaque at the carotid bifurcations and intracranial ICAs but no hemodynamically significant stenosis, occlusion, or dissection. 2. Negative CT perfusion. Findings communicated to Dr. Selina Cooley at 12:53 pm. Electronically Signed   By: Lesia Hausen M.D.   On: 12/17/2022 12:53   CT HEAD CODE STROKE WO CONTRAST  Result Date: 12/17/2022 CLINICAL DATA:  Code stroke. Neuro deficit, acute, stroke suspected acute aphasia, repeat head CT prior to TNK EXAM: CT HEAD WITHOUT CONTRAST TECHNIQUE: Contiguous axial images were obtained from the base of the skull through the vertex without intravenous contrast. RADIATION DOSE REDUCTION: This exam was performed according to the departmental dose-optimization program which includes automated exposure control, adjustment of the mA and/or kV according to patient size and/or use of iterative reconstruction technique. COMPARISON:  Same day CT head FINDINGS: Brain: No evidence of acute infarction, hemorrhage, hydrocephalus, extra-axial collection or mass lesion/mass effect. There is  sequela of mild chronic microvascular ischemic change. Vascular: No hyperdense vessel or unexpected calcification. Skull: Normal. Negative for fracture or focal lesion. Sinuses/Orbits: No middle ear or mastoid effusion. Paranasal sinuses are clear. Bilateral lens replacement. Orbits are otherwise unremarkable. Other: None. ASPECTS [facility] Stroke Program Early CT Score): 10 IMPRESSION: No hemorrhage or CT evidence of an acute cortical infarct. Aspects is 10. Findings were paged to Dr. Selina Cooley on 12/17/22 at 12:32 PM via [facility] paging system. Electronically Signed   By: Lorenza Cambridge M.D.   On: 12/17/2022 12:32   DG Chest 2 View  Result Date: 12/17/2022 CLINICAL DATA:  ams EXAM: CHEST - 2 VIEW COMPARISON:  12/07/2022. FINDINGS: Low lung volume. There  is abnormal contour of the right hemidiaphragm with elevated and laterally flattened right hemidiaphragm with its peak along the lateral aspect. Findings are nonspecific but subpulmonic effusion can have similar appearance. Consider ultrasound versus right lateral decubitus radiograph to exclude underlying pleural effusion. Bilateral lung fields are otherwise clear. Left lateral costophrenic angle is clear. Stable cardio-mediastinal silhouette. No acute osseous abnormalities. The soft tissues are within normal limits. IMPRESSION: *There is abnormal contour of the right hemidiaphragm with elevated and laterally flattened right hemidiaphragm with its peak along the lateral aspect. Findings are nonspecific but subpulmonic effusion can have similar appearance. Consider ultrasound versus right lateral decubitus radiograph to exclude underlying pleural effusion. Electronically Signed   By: Jules Schick M.D.   On: 12/17/2022 11:19   CT Cervical Spine Wo Contrast  Result Date: 12/17/2022 CLINICAL DATA:  77 year old male status post fall. EXAM: CT CERVICAL SPINE WITHOUT CONTRAST TECHNIQUE: Multidetector CT imaging of the cervical spine was performed without intravenous  contrast. Multiplanar CT image reconstructions were also generated. RADIATION DOSE REDUCTION: This exam was performed according to the departmental dose-optimization program which includes automated exposure control, adjustment of the mA and/or kV according to patient size and/or use of iterative reconstruction technique. COMPARISON:  Prior cervical spine CT 12/07/2022. FINDINGS: Alignment: Maintained cervical lordosis. Cervicothoracic junction alignment is within normal limits. Bilateral posterior element alignment is within normal limits. Skull base and vertebrae: Visualized skull base is intact. No atlanto-occipital dissociation. Anterior C1-odontoid degeneration but C1 and C2 appear intact and aligned. No acute osseous abnormality identified. Soft tissues and spinal canal: No prevertebral fluid or swelling. No visible canal hematoma. Negative for age noncontrast visible neck soft tissues aside from partially visible right IJ Port-A-Cath and minor calcified carotid atherosclerosis. Disc levels: Generally mild for age cervical spine degeneration. Anterior C1-odontoid degeneration. Vacuum disc redemonstrated at C6-C7. Upper chest: Visible upper thoracic levels appear intact. Negative lung apices. Partially visible right chest Port-A-Cath again. IMPRESSION: 1. No acute traumatic injury identified in the cervical spine. 2. Stable cervical spine degeneration, mild for age at most levels. Electronically Signed   By: Odessa Fleming M.D.   On: 12/17/2022 11:15   CT Head Wo Contrast  Result Date: 12/17/2022 CLINICAL DATA:  77 year old male status post fall. EXAM: CT HEAD WITHOUT CONTRAST TECHNIQUE: Contiguous axial images were obtained from the base of the skull through the vertex without intravenous contrast. RADIATION DOSE REDUCTION: This exam was performed according to the departmental dose-optimization program which includes automated exposure control, adjustment of the mA and/or kV according to patient size and/or use  of iterative reconstruction technique. COMPARISON:  Brain MRI 12/08/2022. Head and cervical spine CT 12/07/2022. FINDINGS: Brain: Stable cerebral volume. No midline shift, ventriculomegaly, mass effect, evidence of mass lesion, intracranial hemorrhage or evidence of cortically based acute infarction. Gray-white differentiation is stable and largely normal for age. Vascular: No suspicious intracranial vascular hyperdensity. Calcified atherosclerosis at the skull base. Skull: No new osseous abnormality identified. Sinuses/Orbits: Visualized paranasal sinuses and mastoids are stable and well aerated. Other: Regressed right forehead scalp hematoma. Orbits soft tissues appears stable and negative. IMPRESSION: 1. No acute intracranial abnormality or acute traumatic injury identified. 2. Regressed right forehead scalp hematoma since 12/07/2022. Electronically Signed   By: Odessa Fleming M.D.   On: 12/17/2022 11:10    Procedures Procedures    Medications Ordered in ED Medications  iohexol (OMNIPAQUE) 350 MG/ML injection 100 mL (75 mLs Intravenous Contrast Given 12/17/22 1230)  iohexol (OMNIPAQUE) 350 MG/ML injection 100 mL (40 mLs  Intravenous Contrast Given 12/17/22 1247)    ED Course/ Medical Decision Making/ A&P Clinical Course as of 12/17/22 1825  Mon Dec 17, 2022  8413 Discussion with caregiver Lavonia Drafts who notes that patient mood has changed and has had increase in his happiness over the course of the past two days and is normally quiet. Going down the hall and stumbled over his feet and landed on a door that was ajar and he fell and hit his head this morning.  Fell this morning and 1-2 weeks. No blood thinners.  [SB]  1141 Patient reevaluated at the request of RN.  Patient noted to have difficulty with speaking. Pt now with acute aphasia in room.  [SB]  1152 Attending evaluated patient and recommends neuro consult and code stroke activated [SB]  1158 Repeat CBG at 107 [SB]  1203 Attending discussed with  Dr. Selina Cooley who will evaluate patient  [SB]  1250 Recommendations as per Neurology who coordinated with Attending who noted unlikely stroke as cause of symptoms at this time.  [SB]  1336 Called Dr. Adela Lank at [facility] who accepts the patient in transfer for MRI [SB]  1606 Patient reevaluated and resting comfortably on stretcher watching TV. [SB]  1638 Patient reevaluated and noted to be spacing out.  Pt holding onto callbell.  Patient unable to answer questions at this time. [SB]  1659 Pt re-evaluated and back at baseline at this time.  [SB]  1708 Secure chat sent to Neurologists regarding patients episode of likely postictal phase. Informed neurologists of plan for MRI brain and transfer to [facility] ED. Inquired on medication recommendations as well as calling for admission. Awaiting recs. [SB]  1735 [facility]  to [facility] ED for MRI Brain [SB]  1739 Spoke with Dr. Amada Jupiter who will evaluate the patient in the ED when he arrives to [facility] ED [SB]    Clinical Course User Index [SB] ,  A, PA-C                             Medical Decision Making Amount and/or Complexity of Data Reviewed Labs: ordered. Radiology: ordered.   Pt presents with concerns for altered mental status onset prior to arrival.  Caregiver notes that patient has had an increase in happiness over the past couple of days.  Patient afebrile, not tachycardic or hypoxic.  On exam patient with no acute focal neurological deficits.  No spinal tenderness to palpation.  No chest wall or abdominal tenderness to palpation. Differential diagnosis includes anemia, hypoglycemia, intracranial abnormality, electrolyte abnormality, PNA, acute cystitis, arrhythmia, CVA, TIA.    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  The pertinent  results include:   Ammonia unremarkable Valproic acid unremarkable Initial and delta troponin at 3 APTT unremarkable INR and unremarkable PT slightly elevated at 15.6 EtOH unremarkable Negative UDS Negative COVID, flu, RSV CMP unremarkable Urinalysis unremarkable CBC unremarkable   Imaging: I ordered imaging studies including CT head, CXR, CTA head and neck, MRI brain ordered results pending at time of transfer I independently visualized and interpreted imaging which showed: CT cervical  1. No acute traumatic injury identified in the cervical spine.  2. Stable cervical spine degeneration, mild for age at most levels.  CT head  1. No acute intracranial abnormality or acute traumatic injury  identified.  2. Regressed right forehead scalp hematoma since 12/07/2022.  CT cerebral perfusion with contrast:  1. Patent vasculature of the head and neck with mild calcified  plaque at the carotid bifurcations and intracranial ICAs but no  hemodynamically significant stenosis, occlusion, or dissection.  2. Negative CT perfusion.  CT angio head and neck:  1. Patent vasculature of the head and neck with mild calcified  plaque at the carotid bifurcations and intracranial ICAs but no  hemodynamically significant stenosis, occlusion, or dissection.  2. Negative CT perfusion.   I agree with the radiologist interpretation   Consultations: Attending consulted with neurologist, Dr. Selina Cooley due to concerns for likely code stroke.  Dr. Selina Cooley evaluated patient with teleneurology.  Dr. Selina Cooley spoke with attending Dr. Dalene Seltzer who recommended that patient can be transferred to [facility] emergency department for MRI brain, if unremarkable no further stroke workup needed at this time.  I spoke with neurologist, Dr. Amada Jupiter regarding patient's care.  Informed Dr. Luisa Hart that patient is good to be transferred to [facility] emergency department for MRI brain with and without.  Dr. Amada Jupiter noted  that he will evaluate patient in the emergency department when he arrives at [facility]   Disposition: Presenting suspicious for altered mental status.  At this time no acute stroke noted on imaging studies.  Doubt concerns this time for hypoglycemia, arrhythmia, anemia, electrolyte abnormality.  After consideration of the diagnostic results and the patients response to treatment, I feel that the patient would benefit from Transfer to [facility] ED. patient to be transferred for MRI of his brain and further evaluation of his symptoms.  Dr. Amada Jupiter, Neurologist to see patient at [facility]ED. Discussed with patient and caregiver regarding plans for transfer to [facility] emergency department.  Both agreeable at this time.  Patient appears safe for transfer at this time.  This chart was dictated using voice recognition software, Dragon. Despite the best efforts of this provider to proofread and correct errors, errors may still occur which can change documentation meaning.  Final Clinical Impression(s) / ED Diagnoses Final diagnoses:  Altered mental status, unspecified altered mental status type    Rx / DC Orders ED Discharge Orders     None         ,  A, PA-C 12/17/22 1825    ,  A, PA-C 12/17/22 Margretta Sidle, MD 12/21/22 2113

## 2022-12-17 NOTE — Progress Notes (Signed)
1155- Code stroke activation- while in ED sudden onset of aphasia at 38. 70- Neurology (Dr Selina Cooley) on camera for stroke assessment.  1215- Pt sent for repeat NCCT 1226- Pt returned from NCCT, and sent back for advanced imaging. 1304- Pt returned from CT- neuro eval complete. No TNK d/t LKW "sometime yesterday".

## 2022-12-17 NOTE — ED Notes (Signed)
ED Provider at bedside. 

## 2022-12-17 NOTE — ED Provider Notes (Signed)
Pt transferred from DB for MRI.  He is awake and alert and has no complaints.  Neurology notified that he is here.  Depakote level nl.  Pt was agitated in MRI, so he was sent back to the ED and is still waiting for MRI.  Valium ordered for sedation for MRI.  I spoke with Dr. Derry Lory.  Plan is if MRI nl, he can go home.     Jacalyn Lefevre, MD 12/17/22 2333

## 2022-12-18 ENCOUNTER — Emergency Department (HOSPITAL_COMMUNITY): Payer: No Typology Code available for payment source

## 2022-12-18 ENCOUNTER — Other Ambulatory Visit: Payer: Self-pay

## 2022-12-18 ENCOUNTER — Emergency Department (HOSPITAL_COMMUNITY)
Admission: EM | Admit: 2022-12-18 | Discharge: 2022-12-19 | Disposition: A | Discharge status: Home / Self Care | Payer: No Typology Code available for payment source | Attending: Emergency Medicine | Admitting: Emergency Medicine

## 2022-12-18 DIAGNOSIS — C259 Malignant neoplasm of pancreas, unspecified: Secondary | ICD-10-CM | POA: Insufficient documentation

## 2022-12-18 DIAGNOSIS — Z7982 Long term (current) use of aspirin: Secondary | ICD-10-CM | POA: Diagnosis not present

## 2022-12-18 DIAGNOSIS — R4182 Altered mental status, unspecified: Secondary | ICD-10-CM | POA: Diagnosis present

## 2022-12-18 DIAGNOSIS — F17210 Nicotine dependence, cigarettes, uncomplicated: Secondary | ICD-10-CM | POA: Diagnosis not present

## 2022-12-18 DIAGNOSIS — R404 Transient alteration of awareness: Secondary | ICD-10-CM | POA: Diagnosis not present

## 2022-12-18 DIAGNOSIS — R569 Unspecified convulsions: Secondary | ICD-10-CM | POA: Diagnosis not present

## 2022-12-18 DIAGNOSIS — R4689 Other symptoms and signs involving appearance and behavior: Secondary | ICD-10-CM

## 2022-12-18 DIAGNOSIS — R4701 Aphasia: Secondary | ICD-10-CM | POA: Diagnosis not present

## 2022-12-18 MED ORDER — HALOPERIDOL LACTATE 5 MG/ML IJ SOLN
5.0000 mg | Freq: Once | INTRAMUSCULAR | Status: AC
  Administered 2022-12-18: 5 mg via INTRAMUSCULAR
  Filled 2022-12-18: qty 1

## 2022-12-18 MED ORDER — FERROUS GLUCONATE 324 (38 FE) MG PO TABS
324.0000 mg | ORAL_TABLET | Freq: Every day | ORAL | Status: DC
  Administered 2022-12-19: 324 mg via ORAL
  Filled 2022-12-18: qty 1

## 2022-12-18 MED ORDER — RISPERIDONE 0.5 MG PO TBDP: 2.0000 mg | ORAL_TABLET | Freq: Three times a day (TID) | ORAL | Status: DC | PRN

## 2022-12-18 MED ORDER — ZIPRASIDONE MESYLATE 20 MG IM SOLR: 20.0000 mg | INTRAMUSCULAR | Status: DC | PRN

## 2022-12-18 MED ORDER — LEVETIRACETAM 500 MG PO TABS
1500.0000 mg | ORAL_TABLET | Freq: Two times a day (BID) | ORAL | Status: DC
  Administered 2022-12-18 – 2022-12-19 (×2): 1500 mg via ORAL
  Filled 2022-12-18 (×2): qty 3

## 2022-12-18 MED ORDER — PANTOPRAZOLE SODIUM 40 MG PO TBEC
40.0000 mg | DELAYED_RELEASE_TABLET | Freq: Every day | ORAL | Status: DC
  Administered 2022-12-19: 40 mg via ORAL
  Filled 2022-12-18: qty 1

## 2022-12-18 MED ORDER — VITAMIN C 500 MG PO TABS
500.0000 mg | ORAL_TABLET | Freq: Every day | ORAL | Status: DC
  Administered 2022-12-19: 500 mg via ORAL
  Filled 2022-12-18: qty 1

## 2022-12-18 MED ORDER — ZOLPIDEM TARTRATE 5 MG PO TABS: 5.0000 mg | ORAL_TABLET | Freq: Every evening | ORAL | Status: DC | PRN

## 2022-12-18 MED ORDER — ROPINIROLE HCL 1 MG PO TABS
1.0000 mg | ORAL_TABLET | Freq: Every day | ORAL | Status: DC
  Administered 2022-12-18: 1 mg via ORAL
  Filled 2022-12-18: qty 1

## 2022-12-18 MED ORDER — QUETIAPINE FUMARATE 100 MG PO TABS: 100.0000 mg | ORAL_TABLET | Freq: Every day | ORAL | Status: DC

## 2022-12-18 MED ORDER — TRAZODONE HCL 50 MG PO TABS: 75.0000 mg | ORAL_TABLET | Freq: Every evening | ORAL | Status: DC | PRN

## 2022-12-18 MED ORDER — NICOTINE 14 MG/24HR TD PT24: 14.0000 mg | MEDICATED_PATCH | Freq: Every day | TRANSDERMAL | Status: DC

## 2022-12-18 MED ORDER — ACETAMINOPHEN 325 MG PO TABS: 650.0000 mg | ORAL_TABLET | ORAL | Status: DC | PRN

## 2022-12-18 MED ORDER — ESCITALOPRAM OXALATE 10 MG PO TABS
5.0000 mg | ORAL_TABLET | Freq: Every day | ORAL | Status: DC
  Administered 2022-12-19: 5 mg via ORAL
  Filled 2022-12-18: qty 1

## 2022-12-18 MED ORDER — MELATONIN 3 MG PO TABS: 3.0000 mg | ORAL_TABLET | Freq: Every day | ORAL | Status: DC

## 2022-12-18 MED ORDER — NICOTINE 21 MG/24HR TD PT24: 21.0000 mg | MEDICATED_PATCH | Freq: Every day | TRANSDERMAL | Status: DC

## 2022-12-18 MED ORDER — ROSUVASTATIN CALCIUM 5 MG PO TABS
5.0000 mg | ORAL_TABLET | Freq: Every day | ORAL | Status: DC
  Administered 2022-12-18: 5 mg via ORAL
  Filled 2022-12-18: qty 1

## 2022-12-18 MED ORDER — VITAMIN D 25 MCG (1000 UNIT) PO TABS
2000.0000 [IU] | ORAL_TABLET | Freq: Every day | ORAL | Status: DC
  Administered 2022-12-18: 2000 [IU] via ORAL
  Filled 2022-12-18: qty 2

## 2022-12-18 MED ORDER — LORAZEPAM 1 MG PO TABS: 1.0000 mg | ORAL_TABLET | ORAL | Status: DC | PRN

## 2022-12-18 MED ORDER — GADOBUTROL 1 MMOL/ML IV SOLN
6.0000 mL | Freq: Once | INTRAVENOUS | Status: AC | PRN
  Administered 2022-12-18: 6 mL via INTRAVENOUS

## 2022-12-18 MED ORDER — DIVALPROEX SODIUM ER 500 MG PO TB24
1000.0000 mg | ORAL_TABLET | Freq: Every day | ORAL | Status: DC
  Administered 2022-12-18: 1000 mg via ORAL
  Filled 2022-12-18: qty 2

## 2022-12-18 MED ORDER — DONEPEZIL HCL 5 MG PO TABS
5.0000 mg | ORAL_TABLET | Freq: Every morning | ORAL | Status: DC
  Administered 2022-12-19: 5 mg via ORAL
  Filled 2022-12-18: qty 1

## 2022-12-18 MED ORDER — FINASTERIDE 5 MG PO TABS
5.0000 mg | ORAL_TABLET | Freq: Every day | ORAL | Status: DC
  Administered 2022-12-19: 5 mg via ORAL
  Filled 2022-12-18: qty 1

## 2022-12-18 NOTE — ED Notes (Signed)
Pt walked out of department. [name] (caregiver) here to pick pt up. Nightshift nurse gave report to facility

## 2022-12-18 NOTE — ED Notes (Signed)
Per report, pt's daughter, [name] coming to get pt to take him to chemo. Nightshift RN gave report to facility. Ox3 (disoriented to time). Not aphasic at this time.

## 2022-12-18 NOTE — ED Provider Notes (Addendum)
[place] EMERGENCY DEPARTMENT AT [facility] Provider Note   CSN: [id number] Arrival date & time: 12/18/22  1413     History  Chief Complaint  Patient presents with   Psychiatric Evaluation    Kerry [name] is a 77 y.o. male.  HPI    77 year old with history of anxiety, bipolar disorder, sleep apnea, seizure disorder, pancreatic cancer stage III on chemo presented to the emergency room with chief complaint of increased agitation, confusion.  Patient resides at independent living facility.  He was admitted to the hospital recently for encephalopathy.  He was discharged, and had come back to the emergency room for completion of MRI and was discharged this morning.  Patient currently resides at independent living facility.  Caregiver came with the patient.  She states that the for the last week patient has been altered.  He has been wandering through the building, smoking cigarettes within the rooms, disturbing other members who live there.  Additionally, because of his behavior, oncology team has discontinued his medications.   Patient's current behavior is not sufficient for him to be at an independent living facility.  They think that he needs adjustments to his medications to improve his behavior.  Caregiver unsure if patient is taking his medications.   Home Medications Prior to Admission medications   Medication Sig Start Date End Date Taking? Authorizing Provider  aspirin EC 81 MG tablet Take 1 tablet (81 mg total) by mouth daily. 10/28/21   Rodolph Bong, MD  Cholecalciferol (VITAMIN D) 50 MCG (2000 UT) tablet Take 2,000 Units by mouth at bedtime.    [provider]  clotrimazole (LOTRIMIN) 1 % external solution Apply 1 application  topically daily as needed (For dystrophic nails).    [provider]  divalproex (DEPAKOTE ER) 500 MG 24 hr tablet Take 2 tablets (1,000 mg total) by mouth at bedtime. 12/09/22 01/08/23  Dorcas Carrow, MD   donepezil (ARICEPT) 5 MG tablet Take 5 mg by mouth every morning. For memory    [provider]  Emollient (CETAPHIL) cream Apply 1 application. topically daily.    [provider]  escitalopram (LEXAPRO) 10 MG tablet Take 5 mg by mouth daily. For mental health    [provider]  ferrous gluconate (FERGON) 324 MG tablet Take 324 mg by mouth daily. Take with Vitamin C/ascorbic acid    [provider]  finasteride (PROSCAR) 5 MG tablet Take 5 mg by mouth daily. For overactive bladder    [provider]  levETIRAcetam (KEPPRA) 750 MG tablet Take 2 tablets (1,500 mg total) by mouth 2 (two) times daily. 12/09/22   Dorcas Carrow, MD  melatonin 3 MG TABS tablet Take 3 mg by mouth at bedtime.    [provider]  nicotine (NICODERM CQ - DOSED IN MG/24 HOURS) 14 mg/24hr patch Place 14 mg onto the skin daily.    [provider]  pantoprazole (PROTONIX) 40 MG tablet Take 1 tablet (40 mg total) by mouth daily. 10/25/21   Rodolph Bong, MD  QUEtiapine (SEROQUEL) 100 MG tablet Take 100 mg by mouth at bedtime. For mood and sleep    [provider]  rOPINIRole (REQUIP) 1 MG tablet Take 1 mg by mouth at bedtime.    [provider]  rosuvastatin (CRESTOR) 10 MG tablet Take 0.5 tablets (5 mg total) by mouth at bedtime. For cholesterol 11/01/21   Rodolph Bong, MD  traZODone (DESYREL) 150 MG tablet Take 75 mg by mouth  at bedtime as needed for sleep.    [provider]  vitamin C (ASCORBIC ACID) 500 MG tablet Take 500 mg by mouth daily.    [provider]  zolpidem (AMBIEN) 10 MG tablet Take 5 mg by mouth at bedtime as needed for sleep.    [provider]      Allergies    Fluoride    Review of Systems   Review of Systems  All other systems reviewed and are negative.   Physical Exam Updated Vital Signs BP (!) 145/79 (BP Location: Left Arm)   Pulse 84   Resp 16   Ht 5\' 6"  (1.676 m)   Wt 62 kg    SpO2 100%   BMI 22.06 kg/m  Physical Exam Vitals and nursing note reviewed.  Constitutional:      Appearance: He is well-developed.  HENT:     Head: Atraumatic.  Cardiovascular:     Rate and Rhythm: Normal rate.  Pulmonary:     Effort: Pulmonary effort is normal.  Musculoskeletal:     Cervical back: Neck supple.  Skin:    General: Skin is warm.  Neurological:     Mental Status: He is alert and oriented to person, place, and time.     ED Results / Procedures / Treatments   Labs (all labs ordered are listed, but only abnormal results are displayed) Labs Reviewed - No data to display  EKG None  Radiology MR Brain W and Wo Contrast  Result Date: 12/18/2022 CLINICAL DATA:  Altered mental status EXAM: MRI HEAD WITHOUT AND WITH CONTRAST TECHNIQUE: Multiplanar, multiecho pulse sequences of the brain and surrounding structures were obtained without and with intravenous contrast. CONTRAST:  6mL GADAVIST GADOBUTROL 1 MMOL/ML IV SOLN COMPARISON:  12/08/2022 FINDINGS: Brain: No acute infarct, mass effect or extra-axial collection. No acute or chronic hemorrhage. There is multifocal hyperintense T2-weighted signal within the white matter. Generalized volume loss. The midline structures are normal. There is no abnormal contrast enhancement. Vascular: Major flow voids are preserved. Skull and upper cervical spine: Normal calvarium and skull base. Visualized upper cervical spine and soft tissues are normal. Sinuses/Orbits:No paranasal sinus fluid levels or advanced mucosal thickening. No mastoid or middle ear effusion. Normal orbits. IMPRESSION: 1. No acute intracranial abnormality. 2. Findings of chronic small vessel ischemia and volume loss. Electronically Signed   By: Deatra Robinson M.D.   On: 12/18/2022 03:44   CT ANGIO HEAD NECK W WO CM (CODE STROKE)  Result Date: 12/17/2022 CLINICAL DATA:  Slurred speech EXAM: CT ANGIOGRAPHY HEAD AND NECK CT PERFUSION BRAIN TECHNIQUE: Multidetector CT  imaging of the head and neck was performed using the standard protocol during bolus administration of intravenous contrast. Multiplanar CT image reconstructions and MIPs were obtained to evaluate the vascular anatomy. Carotid stenosis measurements (when applicable) are obtained utilizing NASCET criteria, using the distal internal carotid diameter as the denominator. Multiphase CT imaging of the brain was performed following IV bolus contrast injection. Subsequent parametric perfusion maps were calculated using RAPID software. RADIATION DOSE REDUCTION: This exam was performed according to the departmental dose-optimization program which includes automated exposure control, adjustment of the mA and/or kV according to patient size and/or use of iterative reconstruction technique. CONTRAST:  75mL OMNIPAQUE IOHEXOL 350 MG/ML SOLN; 40mL OMNIPAQUE IOHEXOL 350 MG/ML SOLN COMPARISON:  Same-day noncontrast CT head FINDINGS: CTA NECK FINDINGS Aortic arch: There is a minimal calcified plaque in the imaged aortic arch. The origins of the major branch vessels are patent. The  subclavian arteries are patent to the level imaged. Right carotid system: The right common, internal, and external carotid arteries are patent, with minimal plaque at the bifurcation but no hemodynamically significant stenosis or occlusion. There is no evidence of dissection or aneurysm. Left carotid system: The left common, internal, and external carotid arteries are patent, with minimal plaque at the bifurcation but no hemodynamically significant stenosis or occlusion there is no evidence of dissection or aneurysm. Vertebral arteries: The vertebral arteries are patent, without hemodynamically significant stenosis or occlusion. There is no evidence of dissection or aneurysm. Skeleton: There is no acute osseous abnormality or suspicious osseous lesion. There is no visible canal hematoma. Other neck: The soft tissues of the neck are unremarkable. Upper chest:  The imaged lung apices are clear. A right chest wall port is noted. Review of the MIP images confirms the above findings CTA HEAD FINDINGS Anterior circulation: There is mild calcified plaque in the intracranial ICAs without significant stenosis or occlusion. The bilateral MCAs and ACAS are patent, without proximal stenosis or occlusion. The anterior communicating artery is normal. There is no aneurysm or AVM. Posterior circulation: The bilateral V4 segments are patent. The basilar artery is patent. Major cerebellar arteries appear patent. The bilateral PCAs are patent, without proximal stenosis or occlusion. A diminutive left posterior communicating artery is identified. There is no aneurysm or AVM. Venous sinuses: Patent. Anatomic variants: None. Review of the MIP images confirms the above findings CT Brain Perfusion Findings: CBF (<30%) Volume: 0mL Perfusion (Tmax>6.0s) volume: 0mL Mismatch Volume: 0mL Infarction Location:n/a IMPRESSION: 1. Patent vasculature of the head and neck with mild calcified plaque at the carotid bifurcations and intracranial ICAs but no hemodynamically significant stenosis, occlusion, or dissection. 2. Negative CT perfusion. Findings communicated to Dr. Selina Cooley at 12:53 pm. Electronically Signed   By: Lesia Hausen M.D.   On: 12/17/2022 12:53   CT CEREBRAL PERFUSION W CONTRAST  Result Date: 12/17/2022 CLINICAL DATA:  Slurred speech EXAM: CT ANGIOGRAPHY HEAD AND NECK CT PERFUSION BRAIN TECHNIQUE: Multidetector CT imaging of the head and neck was performed using the standard protocol during bolus administration of intravenous contrast. Multiplanar CT image reconstructions and MIPs were obtained to evaluate the vascular anatomy. Carotid stenosis measurements (when applicable) are obtained utilizing NASCET criteria, using the distal internal carotid diameter as the denominator. Multiphase CT imaging of the brain was performed following IV bolus contrast injection. Subsequent parametric  perfusion maps were calculated using RAPID software. RADIATION DOSE REDUCTION: This exam was performed according to the departmental dose-optimization program which includes automated exposure control, adjustment of the mA and/or kV according to patient size and/or use of iterative reconstruction technique. CONTRAST:  75mL OMNIPAQUE IOHEXOL 350 MG/ML SOLN; 40mL OMNIPAQUE IOHEXOL 350 MG/ML SOLN COMPARISON:  Same-day noncontrast CT head FINDINGS: CTA NECK FINDINGS Aortic arch: There is a minimal calcified plaque in the imaged aortic arch. The origins of the major branch vessels are patent. The subclavian arteries are patent to the level imaged. Right carotid system: The right common, internal, and external carotid arteries are patent, with minimal plaque at the bifurcation but no hemodynamically significant stenosis or occlusion. There is no evidence of dissection or aneurysm. Left carotid system: The left common, internal, and external carotid arteries are patent, with minimal plaque at the bifurcation but no hemodynamically significant stenosis or occlusion there is no evidence of dissection or aneurysm. Vertebral arteries: The vertebral arteries are patent, without hemodynamically significant stenosis or occlusion. There is no evidence of dissection or aneurysm. Skeleton:  There is no acute osseous abnormality or suspicious osseous lesion. There is no visible canal hematoma. Other neck: The soft tissues of the neck are unremarkable. Upper chest: The imaged lung apices are clear. A right chest wall port is noted. Review of the MIP images confirms the above findings CTA HEAD FINDINGS Anterior circulation: There is mild calcified plaque in the intracranial ICAs without significant stenosis or occlusion. The bilateral MCAs and ACAS are patent, without proximal stenosis or occlusion. The anterior communicating artery is normal. There is no aneurysm or AVM. Posterior circulation: The bilateral V4 segments are patent. The  basilar artery is patent. Major cerebellar arteries appear patent. The bilateral PCAs are patent, without proximal stenosis or occlusion. A diminutive left posterior communicating artery is identified. There is no aneurysm or AVM. Venous sinuses: Patent. Anatomic variants: None. Review of the MIP images confirms the above findings CT Brain Perfusion Findings: CBF (<30%) Volume: 0mL Perfusion (Tmax>6.0s) volume: 0mL Mismatch Volume: 0mL Infarction Location:n/a IMPRESSION: 1. Patent vasculature of the head and neck with mild calcified plaque at the carotid bifurcations and intracranial ICAs but no hemodynamically significant stenosis, occlusion, or dissection. 2. Negative CT perfusion. Findings communicated to Dr. Selina Cooley at 12:53 pm. Electronically Signed   By: Lesia Hausen M.D.   On: 12/17/2022 12:53   CT HEAD CODE STROKE WO CONTRAST  Result Date: 12/17/2022 CLINICAL DATA:  Code stroke. Neuro deficit, acute, stroke suspected acute aphasia, repeat head CT prior to TNK EXAM: CT HEAD WITHOUT CONTRAST TECHNIQUE: Contiguous axial images were obtained from the base of the skull through the vertex without intravenous contrast. RADIATION DOSE REDUCTION: This exam was performed according to the departmental dose-optimization program which includes automated exposure control, adjustment of the mA and/or kV according to patient size and/or use of iterative reconstruction technique. COMPARISON:  Same day CT head FINDINGS: Brain: No evidence of acute infarction, hemorrhage, hydrocephalus, extra-axial collection or mass lesion/mass effect. There is sequela of mild chronic microvascular ischemic change. Vascular: No hyperdense vessel or unexpected calcification. Skull: Normal. Negative for fracture or focal lesion. Sinuses/Orbits: No middle ear or mastoid effusion. Paranasal sinuses are clear. Bilateral lens replacement. Orbits are otherwise unremarkable. Other: None. ASPECTS [facility] Stroke Program Early CT Score): 10  IMPRESSION: No hemorrhage or CT evidence of an acute cortical infarct. Aspects is 10. Findings were paged to Dr. Selina Cooley on 12/17/22 at 12:32 PM via [facility] paging system. Electronically Signed   By: Lorenza Cambridge M.D.   On: 12/17/2022 12:32   DG Chest 2 View  Result Date: 12/17/2022 CLINICAL DATA:  ams EXAM: CHEST - 2 VIEW COMPARISON:  12/07/2022. FINDINGS: Low lung volume. There is abnormal contour of the right hemidiaphragm with elevated and laterally flattened right hemidiaphragm with its peak along the lateral aspect. Findings are nonspecific but subpulmonic effusion can have similar appearance. Consider ultrasound versus right lateral decubitus radiograph to exclude underlying pleural effusion. Bilateral lung fields are otherwise clear. Left lateral costophrenic angle is clear. Stable cardio-mediastinal silhouette. No acute osseous abnormalities. The soft tissues are within normal limits. IMPRESSION: *There is abnormal contour of the right hemidiaphragm with elevated and laterally flattened right hemidiaphragm with its peak along the lateral aspect. Findings are nonspecific but subpulmonic effusion can have similar appearance. Consider ultrasound versus right lateral decubitus radiograph to exclude underlying pleural effusion. Electronically Signed   By: Jules Schick M.D.   On: 12/17/2022 11:19   CT Cervical Spine Wo Contrast  Result Date: 12/17/2022 CLINICAL DATA:  77 year old male status post fall.  EXAM: CT CERVICAL SPINE WITHOUT CONTRAST TECHNIQUE: Multidetector CT imaging of the cervical spine was performed without intravenous contrast. Multiplanar CT image reconstructions were also generated. RADIATION DOSE REDUCTION: This exam was performed according to the departmental dose-optimization program which includes automated exposure control, adjustment of the mA and/or kV according to patient size and/or use of iterative reconstruction technique. COMPARISON:  Prior cervical spine CT 12/07/2022.  FINDINGS: Alignment: Maintained cervical lordosis. Cervicothoracic junction alignment is within normal limits. Bilateral posterior element alignment is within normal limits. Skull base and vertebrae: Visualized skull base is intact. No atlanto-occipital dissociation. Anterior C1-odontoid degeneration but C1 and C2 appear intact and aligned. No acute osseous abnormality identified. Soft tissues and spinal canal: No prevertebral fluid or swelling. No visible canal hematoma. Negative for age noncontrast visible neck soft tissues aside from partially visible right IJ Port-A-Cath and minor calcified carotid atherosclerosis. Disc levels: Generally mild for age cervical spine degeneration. Anterior C1-odontoid degeneration. Vacuum disc redemonstrated at C6-C7. Upper chest: Visible upper thoracic levels appear intact. Negative lung apices. Partially visible right chest Port-A-Cath again. IMPRESSION: 1. No acute traumatic injury identified in the cervical spine. 2. Stable cervical spine degeneration, mild for age at most levels. Electronically Signed   By: Odessa Fleming M.D.   On: 12/17/2022 11:15   CT Head Wo Contrast  Result Date: 12/17/2022 CLINICAL DATA:  77 year old male status post fall. EXAM: CT HEAD WITHOUT CONTRAST TECHNIQUE: Contiguous axial images were obtained from the base of the skull through the vertex without intravenous contrast. RADIATION DOSE REDUCTION: This exam was performed according to the departmental dose-optimization program which includes automated exposure control, adjustment of the mA and/or kV according to patient size and/or use of iterative reconstruction technique. COMPARISON:  Brain MRI 12/08/2022. Head and cervical spine CT 12/07/2022. FINDINGS: Brain: Stable cerebral volume. No midline shift, ventriculomegaly, mass effect, evidence of mass lesion, intracranial hemorrhage or evidence of cortically based acute infarction. Gray-white differentiation is stable and largely normal for age.  Vascular: No suspicious intracranial vascular hyperdensity. Calcified atherosclerosis at the skull base. Skull: No new osseous abnormality identified. Sinuses/Orbits: Visualized paranasal sinuses and mastoids are stable and well aerated. Other: Regressed right forehead scalp hematoma. Orbits soft tissues appears stable and negative. IMPRESSION: 1. No acute intracranial abnormality or acute traumatic injury identified. 2. Regressed right forehead scalp hematoma since 12/07/2022. Electronically Signed   By: Odessa Fleming M.D.   On: 12/17/2022 11:10    Procedures Procedures    Medications Ordered in ED Medications  risperiDONE (RISPERDAL M-TABS) disintegrating tablet 2 mg (has no administration in time range)    And  LORazepam (ATIVAN) tablet 1 mg (has no administration in time range)    And  ziprasidone (GEODON) injection 20 mg (has no administration in time range)  acetaminophen (TYLENOL) tablet 650 mg (has no administration in time range)  nicotine (NICODERM CQ - dosed in mg/24 hours) patch 21 mg (has no administration in time range)  Vitamin D 2,000 Units (has no administration in time range)  divalproex (DEPAKOTE ER) 24 hr tablet 1,000 mg (has no administration in time range)  donepezil (ARICEPT) tablet 5 mg (has no administration in time range)  escitalopram (LEXAPRO) tablet 5 mg (has no administration in time range)  ferrous gluconate (FERGON) tablet 324 mg (has no administration in time range)  finasteride (PROSCAR) tablet 5 mg (has no administration in time range)  levETIRAcetam (KEPPRA) tablet 1,500 mg (has no administration in time range)  melatonin tablet 3 mg (has no administration in  time range)  nicotine (NICODERM CQ - dosed in mg/24 hours) patch 14 mg (has no administration in time range)  pantoprazole (PROTONIX) EC tablet 40 mg (has no administration in time range)  QUEtiapine (SEROQUEL) tablet 100 mg (has no administration in time range)  rOPINIRole (REQUIP) tablet 1 mg (has no  administration in time range)  rosuvastatin (CRESTOR) tablet 5 mg (has no administration in time range)  traZODone (DESYREL) tablet 75 mg (has no administration in time range)  ascorbic acid (VITAMIN C) tablet 500 mg (has no administration in time range)  zolpidem (AMBIEN) tablet 5 mg (has no administration in time range)  haloperidol lactate (HALDOL) injection 5 mg (5 mg Intramuscular Given 12/18/22 1628)    ED Course/ Medical Decision Making/ A&P                                 Medical Decision Making Risk OTC drugs. Prescription drug management.   77 year old male comes in with chief complaint of altered mental status and change in behavior over the last week.    Patient has history of anxiety, bipolar disorder, sleep apnea, seizure disorder, pancreatic cancer stage III.  He was recently discharged from the hospital and then the ER earlier today after his MRI was negative.  Per caregiver, patient not safe at an independent living facility. They also think that his behavior has become more erratic and they do not think he is sleeping well.  I reviewed patient's recent discharge summary.  I have acquired history from patient's caregiver.  It appears that patient is having either worsening dementia, delirium or psychosis.  Patient currently not responding to internal stimuli, however he has tried to walk out on 2 separate occasions from the ER.  We will involuntarily commit him.  I have spoken with patient's brother, who also thinks patient cannot safely go home and encourages [place] to IVC him.  Patient will receive Haldol IM right now to prevent escalation.  I will consult psychiatry team and social work team. It appears that patient either needs Geri psych admission or if psychiatry team is able to adjust the medication then probably higher level of care at time of discharge.    Final Clinical Impression(s) / ED Diagnoses Final diagnoses:  Behavioral change    Rx / DC  Orders ED Discharge Orders     None        Derwood Kaplan, MD 12/18/22 1706

## 2022-12-18 NOTE — ED Notes (Signed)
Pt caregiver updated on pt status

## 2022-12-18 NOTE — ED Provider Notes (Signed)
3:28 PM went to room to perform initial evaluation; however was informed by RN patient left with caregiver. Unable to assess patient.     Mannie Stabile, PA-C 12/18/22 1528    Derwood Kaplan, MD 12/21/22 [phone]

## 2022-12-18 NOTE — Consult Note (Signed)
Attempt to evaluate this patient failed.  Patient was found outside alone and was brought back to the ER.  Haldol Injection given.  He is now sleeping.and assessment will be tried later.

## 2022-12-18 NOTE — Discharge Instructions (Signed)
Please follow-up with your neurologist as soon as possible.  Return to the ER for worsening confusion.

## 2022-12-18 NOTE — Social Work (Signed)
CSW has reviewed patient's  chart at this time patient is under [facility] care. TOC will follow up when and if patient is cleared for any TOC needs.

## 2022-12-18 NOTE — ED Provider Notes (Signed)
Patient signed out to me by Dr. Particia Nearing.  Patient transferred here earlier today to undergo MRI secondary to what sounds some speech difficulty after the patient had a seizure.  Patient does have a known seizure disorder.  Patient signed out to me to follow-up on MRI.  MRI has now been performed and there is no acute stroke.  Per plan, patient okay for discharge, follow-up with his neurologist.   Gilda Crease, MD 12/18/22 0400

## 2022-12-18 NOTE — ED Triage Notes (Signed)
Pt caregiver states pt is not acting at baseline, was dc from [facility] this morning. Caregiver states pt has psych history but is unsure if he is taking his meds. Pt lives at independent facility and is normally a&ox 4.

## 2022-12-19 DIAGNOSIS — R404 Transient alteration of awareness: Secondary | ICD-10-CM | POA: Diagnosis not present

## 2022-12-19 NOTE — Consult Note (Signed)
[facility] ED ASSESSMENT   Reason for Consult:  Psych Consult Referring Physician:  Derwood Kaplan Patient Identification: Kerry [name] MRN:  [id number] ED Chief Complaint: Altered mental status  Diagnosis:  Principal Problem:   Altered mental status   ED Assessment Time Calculation: Start Time: 0900 Stop Time: 1000 Total Time in Minutes (Assessment Completion): 60  HPI:  Kerry [name] is a 77 y.o. male patient brought in by caregiver.  He has a history of anxiety, bipolar disorder, seizure disorder, pancreatic cancer stage 3 who presented with increased agitation and confusion.   Subjective:   Kerry [name] is a 77 y.o. male patient  brought in by caregiver.  He has a history of anxiety, bipolar disorder, seizure disorder, pancreatic cancer stage 3 who presented with increased agitation and confusion. Per chart review, patient lives at an independent living facility and for the past week has been wandering through the facility disturbing the other residents. The patient's oncology team has discontinued medication because of his new behaviors.   Patient is seen face to face this morning at [facility] in his emergency room resting comfortably on a stretcher.  He is cooperative and pleasant.  Patient is hard of hearing.  When asked why he in the hospital, patient states he thinks he had another seizure.  He is able to report his medical history.  He is clear in his communication.  He does not display any confusion this morning.  Patient said he slept well and that he feels "fine."    Kerry [name] is alert/oriented x 3, calm, cooperative, attentive, and responses are relevant to assessment questions.  His mood is euthymic and congruent with affect.  He is speaking in a clear tone at moderate volume, and normal pace, with good eye contact. He denies suicidal ideation, homicidal ideation and auditory and visual hallucinations. Objectively:  there is no evidence of psychosis/mania, delusional  thinking, or significantly impaired.  He is able to converse coherently, with goal directed thoughts, and no distractibility, or pre-occupation.  He has responded appropriately to questions and has remained calm and cooperative throughout assessment.     After thorough evaluation and review of information currently presented on assessment of Kerry [name], there is insufficient findings to indicate patient meets criteria for involuntary commitment or require psychiatric hospitalization.  He is not a risk for harm to himself or others.  Patient does not meet criteria for inpatient psychiatric hospitalization.  He is psychiatrically cleared.     Past Psychiatric History: DX of Bipolar Disorder and anxiety.  Previous ED visits for disorientation and bizarre behavior  Risk to Self or Others: Is the patient at risk to self? No Has the patient been a risk to self in the past 6 months? No Has the patient been a risk to self within the distant past? No Is the patient a risk to others? No Has the patient been a risk to others in the past 6 months? No Has the patient been a risk to others within the distant past? No  [place] Scale:  Flowsheet Row ED from 12/18/2022 in [facility] Emergency Department at [facility] ED from 12/17/2022 in [facility] Emergency Department at [facility] ED to Hosp-Admission (Discharged) from 12/07/2022 in [facility]  C-SSRS RISK CATEGORY No Risk No Risk No Risk       Past Medical History:  Past Medical History:  Diagnosis Date   anxiety    Anxiety    Bipolar 1 disorder (  HCC)    Sleep apnea     Past Surgical History:  Procedure Laterality Date   BILIARY BRUSHING  10/23/2021   Procedure: BILIARY BRUSHING;  Surgeon: Meridee Score Netty Starring., MD;  Location: [facility] ENDOSCOPY;  Service: Gastroenterology;;   BILIARY STENT PLACEMENT  10/23/2021   Procedure: BILIARY STENT PLACEMENT;  Surgeon: Lemar Lofty., MD;  Location: [facility]  ENDOSCOPY;  Service: Gastroenterology;;   BIOPSY  10/23/2021   Procedure: BIOPSY;  Surgeon: Lemar Lofty., MD;  Location: [facility] ENDOSCOPY;  Service: Gastroenterology;;   ERCP N/A 10/23/2021   Procedure: ENDOSCOPIC RETROGRADE CHOLANGIOPANCREATOGRAPHY (ERCP);  Surgeon: Lemar Lofty., MD;  Location: [facility] ENDOSCOPY;  Service: Gastroenterology;  Laterality: N/A;   ESOPHAGOGASTRODUODENOSCOPY (EGD) WITH PROPOFOL N/A 10/23/2021   Procedure: ESOPHAGOGASTRODUODENOSCOPY (EGD) WITH PROPOFOL;  Surgeon: Meridee Score Netty Starring., MD;  Location: [facility] ENDOSCOPY;  Service: Gastroenterology;  Laterality: N/A;   FINE NEEDLE ASPIRATION  10/23/2021   Procedure: FINE NEEDLE ASPIRATION (FNA) LINEAR;  Surgeon: Lemar Lofty., MD;  Location: [facility] ENDOSCOPY;  Service: Gastroenterology;;   REMOVAL OF STONES  10/23/2021   Procedure: REMOVAL OF SLUDGE;  Surgeon: Lemar Lofty., MD;  Location: [facility] ENDOSCOPY;  Service: Gastroenterology;;   [name]  10/23/2021   Procedure: [name];  Surgeon: Lemar Lofty., MD;  Location[facility] ENDOSCOPY;  Service: Gastroenterology;;   UPPER ESOPHAGEAL ENDOSCOPIC ULTRASOUND (EUS) N/A 10/23/2021   Procedure: UPPER ESOPHAGEAL ENDOSCOPIC ULTRASOUND (EUS);  Surgeon: Lemar Lofty., MD;  Location: [facility] ENDOSCOPY;  Service: Gastroenterology;  Laterality: N/A;   Family History:  Family History  Problem Relation Age of Onset   Heart disease Neg Hx    Family Psychiatric  History: None noted Social History:  Social History   Substance and Sexual Activity  Alcohol Use Never     Social History   Substance and Sexual Activity  Drug Use Never    Social History   Socioeconomic History   Marital status: Married    Spouse name: Not on file   Number of children: Not on file   Years of education: Not on file   Highest education level: Not on file  Occupational History   Not on file  Tobacco Use   Smoking status: Every Day    Current packs/day: 2.00     Types: Cigarettes   Smokeless tobacco: Never  Vaping Use   Vaping status: Never Used  Substance and Sexual Activity   Alcohol use: Never   Drug use: Never   Sexual activity: Not on file  Other Topics Concern   Not on file  Social History Narrative   Not on file   Social Determinants of Health   Financial Resource Strain: Medium Risk (08/28/2022)   Received from [facility], [facility]   Overall Financial Resource Strain (CARDIA)    Difficulty of Paying Living Expenses: Somewhat hard  Food Insecurity: No Food Insecurity (12/08/2022)   Hunger Vital Sign    Worried About Running Out of Food in the Last Year: Never true    Ran Out of Food in the Last Year: Never true  Transportation Needs: No Transportation Needs (12/08/2022)   PRAPARE - Administrator, Civil Service (Medical): No    Lack of Transportation (Non-Medical): No  Physical Activity: Not on file  Stress: Stress Concern Present (08/21/2022)   Received from [facility], [facility] - Occupational Stress Questionnaire    Feeling of Stress : To some extent  Social Connections: Unknown (10/02/2021)  Received from [facility], [facility]   Social Network    Social Network: Not on file   Additional Social History:  Patient lives in an independent living facility  Allergies:   Allergies  Allergen Reactions   Fluoride Other (See Comments)    Seizures and stroke after dental visit from using fluoride paste (per pt)     Labs:  Results for orders placed or performed during the hospital encounter of 12/17/22 (from the past 48 hour(s))  CBG monitoring, ED     Status: Abnormal   Collection Time: 12/17/22 11:10 AM  Result Value Ref Range   Glucose-Capillary 103 (H) 70 - 99 mg/dL    Comment: Glucose reference range applies only to samples taken after fasting for at least 8 hours.  CBG monitoring, ED     Status: Abnormal   Collection Time: 12/17/22 11:58 AM   Result Value Ref Range   Glucose-Capillary 107 (H) 70 - 99 mg/dL    Comment: Glucose reference range applies only to samples taken after fasting for at least 8 hours.  Troponin I (High Sensitivity)     Status: None   Collection Time: 12/17/22 12:48 PM  Result Value Ref Range   Troponin I (High Sensitivity) 3 <18 ng/L    Comment: (NOTE) Elevated high sensitivity troponin I (hsTnI) values and significant  changes across serial measurements may suggest ACS but many other  chronic and acute conditions are known to elevate hsTnI results.  Refer to the "Links" section for chest pain algorithms and additional  guidance. Performed at [facility], [address] [place], [place] [address]   Ethanol     Status: None   Collection Time: 12/17/22 12:48 PM  Result Value Ref Range   Alcohol, Ethyl (B) <10 <10 mg/dL    Comment: (NOTE) Lowest detectable limit for serum alcohol is 10 mg/dL.  For medical purposes only. Performed at [facility], [address], [place], [place] [address]   Protime-INR     Status: Abnormal   Collection Time: 12/17/22 12:48 PM  Result Value Ref Range   Prothrombin Time 15.6 (H) 11.4 - 15.2 seconds   INR 1.2 0.8 - 1.2    Comment: (NOTE) INR goal varies based on device and disease states. Performed at [facility], [address], [place], [place] [address]   APTT     Status: None   Collection Time: 12/17/22 12:48 PM  Result Value Ref Range   aPTT 30 24 - 36 seconds    Comment: Performed at [facility], [address], [place], [place] [address]  Valproic acid level     Status: None   Collection Time: 12/17/22  4:35 PM  Result Value Ref Range   Valproic Acid Lvl 72 50.0 - 100.0 ug/mL    Comment: Performed at [facility], [address], [place], [place] [address]  CBG monitoring, ED     Status: Abnormal   Collection Time: 12/17/22  4:38 PM  Result Value  Ref Range   Glucose-Capillary 100 (H) 70 - 99 mg/dL    Comment: Glucose reference range applies only to samples taken after fasting for at least 8 hours.  Ammonia     Status: None   Collection Time: 12/17/22  4:42 PM  Result Value Ref Range   Ammonia 24 9 - 35 umol/L    Comment: Performed at [facility], [address] [place], [place] [address]    Current Facility-Administered Medications  Medication Dose Route Frequency Provider Last Rate Last Admin   acetaminophen (TYLENOL) tablet 650 mg  650 mg Oral Q4H PRN Rhunette Croft, Ankit, MD       ascorbic acid (VITAMIN C) tablet 500 mg  500 mg Oral Daily Rhunette Croft, Ankit, MD   500 mg at 12/19/22 1004   cholecalciferol (VITAMIN D3) 25 MCG (1000 UNIT) tablet 2,000 Units  2,000 Units Oral QHS Derwood Kaplan, MD   2,000 Units at 12/18/22 2231   divalproex (DEPAKOTE ER) 24 hr tablet 1,000 mg  1,000 mg Oral QHS Nanavati, Ankit, MD   1,000 mg at 12/18/22 2231   donepezil (ARICEPT) tablet 5 mg  5 mg Oral q morning Rhunette Croft, Ankit, MD   5 mg at 12/19/22 1003   escitalopram (LEXAPRO) tablet 5 mg  5 mg Oral Daily Rhunette Croft, Ankit, MD   5 mg at 12/19/22 1003   ferrous gluconate (FERGON) tablet 324 mg  324 mg Oral Daily Rhunette Croft, Ankit, MD   324 mg at 12/19/22 1004   finasteride (PROSCAR) tablet 5 mg  5 mg Oral Daily Rhunette Croft, Ankit, MD   5 mg at 12/19/22 1004   levETIRAcetam (KEPPRA) tablet 1,500 mg  1,500 mg Oral BID Derwood Kaplan, MD   1,500 mg at 12/19/22 1003   risperiDONE (RISPERDAL M-TABS) disintegrating tablet 2 mg  2 mg Oral Q8H PRN Derwood Kaplan, MD       And   LORazepam (ATIVAN) tablet 1 mg  1 mg Oral PRN Rhunette Croft, Ankit, MD       And   ziprasidone (GEODON) injection 20 mg  20 mg Intramuscular PRN Nanavati, Ankit, MD       melatonin tablet 3 mg  3 mg Oral QHS Nanavati, Ankit, MD       nicotine (NICODERM CQ - dosed in mg/24 hours) patch 14 mg  14 mg Transdermal Daily Nanavati, Ankit, MD       pantoprazole (PROTONIX) EC tablet  40 mg  40 mg Oral Daily Nanavati, Ankit, MD   40 mg at 12/19/22 1003   QUEtiapine (SEROQUEL) tablet 100 mg  100 mg Oral QHS Nanavati, Ankit, MD       rOPINIRole (REQUIP) tablet 1 mg  1 mg Oral QHS Nanavati, Ankit, MD   1 mg at 12/18/22 2231   rosuvastatin (CRESTOR) tablet 5 mg  5 mg Oral QHS Nanavati, Ankit, MD   5 mg at 12/18/22 2231   traZODone (DESYREL) tablet 75 mg  75 mg Oral QHS PRN Derwood Kaplan, MD       zolpidem (AMBIEN) tablet 5 mg  5 mg Oral QHS PRN Derwood Kaplan, MD       Current Outpatient Medications  Medication Sig Dispense Refill   aspirin EC 81 MG tablet Take 1 tablet (81 mg total) by mouth daily. 30 tablet 12   Cholecalciferol (VITAMIN D) 50 MCG (2000 UT) tablet Take 2,000 Units by mouth at bedtime.     clotrimazole (LOTRIMIN) 1 % external solution Apply 1 application  topically daily as needed (For dystrophic nails).     divalproex (DEPAKOTE ER) 500 MG 24 hr tablet Take 2 tablets (1,000 mg total) by mouth at bedtime. 60 tablet 0   donepezil (ARICEPT) 5 MG tablet Take 5 mg by mouth every morning. For memory     Emollient (CETAPHIL) cream Apply 1 application. topically daily.     escitalopram (LEXAPRO) 10 MG tablet Take 5 mg by mouth daily. For mental health     ferrous gluconate (FERGON) 324 MG  tablet Take 324 mg by mouth daily. Take with Vitamin C/ascorbic acid     finasteride (PROSCAR) 5 MG tablet Take 5 mg by mouth daily. For overactive bladder     levETIRAcetam (KEPPRA) 750 MG tablet Take 2 tablets (1,500 mg total) by mouth 2 (two) times daily. 60 tablet 0   melatonin 3 MG TABS tablet Take 3 mg by mouth at bedtime.     nicotine (NICODERM CQ - DOSED IN MG/24 HOURS) 14 mg/24hr patch Place 14 mg onto the skin daily.     pantoprazole (PROTONIX) 40 MG tablet Take 1 tablet (40 mg total) by mouth daily. 30 tablet 1   QUEtiapine (SEROQUEL) 100 MG tablet Take 100 mg by mouth at bedtime. For mood and sleep     rOPINIRole (REQUIP) 1 MG tablet Take 1 mg by mouth at bedtime.      rosuvastatin (CRESTOR) 10 MG tablet Take 0.5 tablets (5 mg total) by mouth at bedtime. For cholesterol     traZODone (DESYREL) 150 MG tablet Take 75 mg by mouth at bedtime as needed for sleep.     vitamin C (ASCORBIC ACID) 500 MG tablet Take 500 mg by mouth daily.     zolpidem (AMBIEN) 10 MG tablet Take 5 mg by mouth at bedtime as needed for sleep.      Musculoskeletal: Strength & Muscle Tone: within normal limits Gait & Station: normal Patient leans: N/A   Psychiatric Specialty Exam: Presentation  General Appearance:  Appropriate for Environment  Eye Contact: Good  Speech: Clear and Coherent  Speech Volume: Normal  Handedness: Right   Mood and Affect  Mood: Euthymic  Affect: Congruent; Appropriate   Thought Process  Thought Processes: Coherent  Descriptions of Associations:Intact  Orientation:Full (Time, Place and Person)  Thought Content:WDL  History of Schizophrenia/Schizoaffective disorder:No data recorded Duration of Psychotic Symptoms:No data recorded Hallucinations:Hallucinations: None  Ideas of Reference:None  Suicidal Thoughts:Suicidal Thoughts: No  Homicidal Thoughts:Homicidal Thoughts: No   Sensorium  Memory: Immediate Fair; Recent Fair; Remote Fair  Judgment: Intact  Insight: Good   Executive Functions  Concentration: Good  Attention Span: Good  Recall: Good  Fund of Knowledge: Good  Language: Good   Psychomotor Activity  Psychomotor Activity: Psychomotor Activity: Normal   Assets  Assets: Communication Skills; Housing; Leisure Time    Sleep  Sleep: Sleep: Good   Physical Exam: Physical Exam Vitals and nursing note reviewed.  Eyes:     Pupils: Pupils are equal, round, and reactive to light.  Pulmonary:     Effort: Pulmonary effort is normal.  Skin:    General: Skin is dry.  Neurological:     Mental Status: He is alert and oriented to person, place, and time.    Review of Systems  HENT:   Positive for hearing loss.   All other systems reviewed and are negative.  Blood pressure 109/60, pulse 63, temperature 98.3 F (36.8 C), temperature source Oral, resp. rate 18, height 5\' 6"  (1.676 m), weight 62 kg, SpO2 98%. Body mass index is 22.06 kg/m.  Medical Decision Making: Patient case reviewed and discussed with Dr Lucianne Muss.  Patient is not a risk for harm to himself or others.  Patient does not meet criteria for inpatient psychiatric hospitalization.  He is psychiatrically cleared.    Problem 1: Altered mental status -continue current psychiatric medications   Disposition:  Patient is psychiatrically cleared.    Thomes Lolling, NP 12/19/2022 10:46 AM

## 2022-12-19 NOTE — Discharge Instructions (Signed)
The MRI and all the blood work were okay.  No signs of stroke or infection at this time.  The psychiatrist evaluated you and at this time did not feel that you needed to be in a psychiatric hospital and that you should continue your current medications.  Follow-up with your psychiatrist outpatient for any ongoing issues

## 2022-12-19 NOTE — ED Provider Notes (Signed)
Emergency Medicine Observation Re-evaluation Note  Kerry [name] is a 77 y.o. male, seen on rounds today.  Pt initially presented to the ED for complaints of Psychiatric Evaluation Currently, the patient is patient was initially sleeping but awakens easily.  He has no complaints at this time.  Physical Exam  BP 109/60 (BP Location: Right Arm)   Pulse 63   Temp 98.3 F (36.8 C) (Oral)   Resp 18   Ht 5\' 6"  (1.676 m)   Wt 62 kg   SpO2 98%   BMI 22.06 kg/m  Physical Exam General: No acute distress Cardiac: Regular Lungs: Clear Psych: Calm cooperative and goal directed  ED Course / MDM  EKG:EKG Interpretation Date/Time:  Tuesday December 18 2022 17:02:57 EDT Ventricular Rate:  78 PR Interval:  160 QRS Duration:  86 QT Interval:  396 QTC Calculation: 451 R Axis:   55  Text Interpretation: Normal sinus rhythm ST & T wave abnormality, consider inferior ischemia Abnormal ECG Interpretation limited secondary to artifact however no significant change was found Confirmed by Elayne Snare ([id number]) on 12/19/2022 8:46:58 AM  I have reviewed the labs performed to date as well as medications administered while in observation.  Recent changes in the last 24 hours include patient had to receive Haldol initially upon arrival yesterday but has not required anything else throughout the night.  Plan  Current plan is for patient was evaluated by psychiatry today and they do not feel that he meets inpatient criteria and is stable for discharge home.  I went and evaluated the patient and he is able to get out of bed walk without any assistance reports he feels fine and would like to go home.  He did not require any assistance for his ADLs.  Feel that he safe for discharge.  Workup while he was hospitalized and MRI all without acute findings.  Encouraged continuing his medications and following up outpatient with psychiatry.    Gwyneth Sprout, MD 12/19/22 1204

## 2022-12-19 NOTE — Care Management (Addendum)
Transition of Care [facility]) - Emergency Department Mini Assessment   Patient Details  Name: Kerry [name] MRN: [id number] Date of Birth: 1946-02-11  Transition of Care [facility]) CM/SW Contact:    Lavenia Atlas, RN Phone Number: 12/19/2022, 1:01 PM   Clinical Narrative: Per chart review patient was recently discharged and was referred to [facility] on last discharge for HHPT/OT services. Patient is a resident at [facility]. Rene Kocher w[facility] was previously notified of HH needs. Received TOC consult for home health services. This RNCM spoke with patient's caregiver [name]. [name] reports she previously picked patient up and there was no change in his behaviors and will not pick him up until someone from the psych team calls her. This RNCM notified psych NP of Dana's request. [name] also inquired about HCPOA assistance, as [name] reports she has the paperwork. [name] reports she plans to take patient to his MD appointment on Friday and will have the paperwork/ HCPOA completed then. [name] reports she wants to also have his [facility] services set up during his visit on Friday as well.  [name] reports her main concern is to speak with the psych team to determine patient's mental status. Will need HHPT/OT orders and awaiting psych to follow up. TOC following.   - Left voicemail message for [facility] w[facility] to confirm HHPT/OT.  - 2:14pm This RNCM spoke with patient's caregiver [name] to advise of HHPT/OT orders awaiting from EDP. [name] reports she will be at [facility] ED to pick patient up at 2:30pm.   - HHPT/OT orders faxed to [facility].   No additional TOC needs  ED Mini Assessment: What brought you to the Emergency Department? : patient recently discharged and caregiver reports patient's behaviors have changed.  Barriers to Discharge: Other (must enter comment) (Awaiting Psych provider discussion with caregiver)  Barrier interventions: home health consult  Means of departure: Car  Interventions which prevented  an admission or readmission: Home Health Consult or Services    Patient Contact and Communications     Spoke with: [name] Contact Date: 12/19/22,     Contact Phone Number: [phone] Call outcome: awaiting conversation with psych provider  Patient states their goals for this hospitalization and ongoing recovery are:: return to ILF feeling more mentally stable CMS Medicare.gov Compare Post Acute Care list provided to:: Patient Represenative (must comment) [name]- caregiver) Choice offered to / list presented to :  (caregiver)  Admission diagnosis:  Psych Eval Patient Active Problem List   Diagnosis Date Noted   Aggressive behavior 11/22/2021   Status epilepticus (HCC) 11/11/2021   Postictal confusion 11/11/2021   Altered mental status 11/02/2021   Generalized weakness    Jaundice    Dark stools 10/24/2021   Serum ammonia increased (HCC) 10/22/2021   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 [place] [address] Phone: [phone] Fax: [phone]  [name] [address] [place] [address] Phone: [phone] Fax: [phone]

## 2022-12-19 NOTE — BH Assessment (Signed)
TTS attempted to assess pt. Per [facility], pt is sleeping. TTS will be notified when pt wakes up.

## 2022-12-23 ENCOUNTER — Ambulatory Visit (HOSPITAL_COMMUNITY)
Admission: EM | Admit: 2022-12-23 | Discharge: 2022-12-23 | Disposition: A | Discharge status: Other Acute Inpatient Hospital | Payer: No Typology Code available for payment source

## 2022-12-23 ENCOUNTER — Emergency Department (HOSPITAL_COMMUNITY)
Admission: EM | Admit: 2022-12-23 | Discharge: 2022-12-24 | Disposition: A | Discharge status: Other Acute Inpatient Hospital | Payer: No Typology Code available for payment source | Attending: Emergency Medicine | Admitting: Emergency Medicine

## 2022-12-23 DIAGNOSIS — Z7982 Long term (current) use of aspirin: Secondary | ICD-10-CM | POA: Diagnosis not present

## 2022-12-23 DIAGNOSIS — R4182 Altered mental status, unspecified: Secondary | ICD-10-CM

## 2022-12-23 DIAGNOSIS — Z8507 Personal history of malignant neoplasm of pancreas: Secondary | ICD-10-CM | POA: Diagnosis not present

## 2022-12-23 DIAGNOSIS — R413 Other amnesia: Secondary | ICD-10-CM | POA: Insufficient documentation

## 2022-12-23 DIAGNOSIS — F17210 Nicotine dependence, cigarettes, uncomplicated: Secondary | ICD-10-CM | POA: Insufficient documentation

## 2022-12-23 DIAGNOSIS — Z20822 Contact with and (suspected) exposure to covid-19: Secondary | ICD-10-CM | POA: Insufficient documentation

## 2022-12-23 DIAGNOSIS — Y900 Blood alcohol level of less than 20 mg/100 ml: Secondary | ICD-10-CM | POA: Insufficient documentation

## 2022-12-23 DIAGNOSIS — F0390 Unspecified dementia without behavioral disturbance: Secondary | ICD-10-CM | POA: Diagnosis not present

## 2022-12-23 DIAGNOSIS — F1310 Sedative, hypnotic or anxiolytic abuse, uncomplicated: Secondary | ICD-10-CM | POA: Insufficient documentation

## 2022-12-23 DIAGNOSIS — R404 Transient alteration of awareness: Secondary | ICD-10-CM | POA: Diagnosis not present

## 2022-12-23 HISTORY — DX: Malignant (primary) neoplasm, unspecified: C80.1

## 2022-12-23 HISTORY — DX: Unspecified dementia, unspecified severity, without behavioral disturbance, psychotic disturbance, mood disturbance, and anxiety: F03.90

## 2022-12-23 HISTORY — DX: Unspecified convulsions: R56.9

## 2022-12-23 NOTE — ED Triage Notes (Signed)
Pt BIB EMS from [facility], where pt arrived yesterday. Lives at ALF, (per EMS facility unknown).   EMS reports pt is A&Ox4, with some confusion to time/place. EMS also reports aggressive behavior.   EMS VS: 122/64, HR 70, 98% RA, RR 18

## 2022-12-23 NOTE — ED Notes (Signed)
Provider made aware that patient has been asking to be discharged

## 2022-12-23 NOTE — ED Triage Notes (Signed)
Patient presents to [facility] voluntarily by [employer]. It was reported patient is exhibiting erratic behaviors at assisted living facility and hasnt slept in 5 days. Patient does not endorse active SI/HI at this time. Patient is fairly groomed and cooperative. Patient appears to have diminished mental capacity. Patient is routine.

## 2022-12-23 NOTE — ED Provider Notes (Signed)
Emergency Department Provider Note   I have reviewed the triage vital signs and the nursing notes.   HISTORY  Chief Complaint Altered Mental Status   HPI Kerry [name] is a 77 y.o. male with PMH of Bipolar and anxiety presents to the ED from [facility] for medical clearance.  He initially presented to behavioral health for evaluation after his independent living facility noticed some bizarre behavior.  He has had some wandering, inappropriate words and touching a male staff, seeming mildly agitated.  He does not appear confused to staff.  Patient tells me that he is feeling well overall but needs some help with his anxiety.  He denies thoughts of harming himself or others.  Denies headaches, chest pain, abdominal discomfort.  After behavioral health assessment today they are recommending geriatric psych placement and was sent to the ED for medical clearance.  Of note, he was at the [facility] emergency department in late July with a similar presentation.  And during that ED stay he had medical workup including MRI brain with and without contrast showing chronic small vessel ischemia.    Past Medical History:  Diagnosis Date   anxiety    Anxiety    Bipolar 1 disorder (HCC)    Sleep apnea     Review of Systems  Constitutional: No fever/chills Eyes: No visual changes. ENT: No sore throat. Cardiovascular: Denies chest pain. Respiratory: Denies shortness of breath. Gastrointestinal: No abdominal pain.  No nausea, no vomiting.  No diarrhea.  No constipation. Genitourinary: Negative for dysuria. Musculoskeletal: Negative for back pain. Skin: Negative for rash. Neurological: Negative for headaches, focal weakness or numbness.  ____________________________________________   PHYSICAL EXAM:  VITAL SIGNS: Vitals:   12/23/22 2350  BP: 110/62  Pulse: 65  Resp: 16  SpO2: 99%   Constitutional: Alert and oriented. Well appearing and in no acute distress. Eyes: Conjunctivae are  normal.  Head: Atraumatic. Nose: No congestion/rhinnorhea. Mouth/Throat: Mucous membranes are moist. Neck: No stridor.   Cardiovascular: Normal rate, regular rhythm. Good peripheral circulation. Grossly normal heart sounds.   Respiratory: Normal respiratory effort.  No retractions. Lungs CTAB. Gastrointestinal: Soft and nontender. No distention.  Musculoskeletal: No lower extremity tenderness nor edema. No gross deformities of extremities. Neurologic:  Normal speech and language. No gross focal neurologic deficits are appreciated.  Skin:  Skin is warm, dry and intact. No rash noted.  ____________________________________________   LABS (all labs ordered are listed, but only abnormal results are displayed)  Labs Reviewed  ACETAMINOPHEN LEVEL - Abnormal; Notable for the following components:      Result Value   Acetaminophen (Tylenol), Serum <10 (*)    All other components within normal limits  COMPREHENSIVE METABOLIC PANEL - Abnormal; Notable for the following components:   Glucose, Bld 100 (*)    Albumin 3.4 (*)    All other components within normal limits  CBC WITH DIFFERENTIAL/PLATELET - Abnormal; Notable for the following components:   RBC 3.32 (*)    Hemoglobin 10.1 (*)    HCT 31.0 (*)    Platelets 98 (*)    All other components within normal limits  RAPID URINE DRUG SCREEN, HOSP PERFORMED - Abnormal; Notable for the following components:   Benzodiazepines POSITIVE (*)    All other components within normal limits  URINALYSIS, W/ REFLEX TO CULTURE (INFECTION SUSPECTED) - Abnormal; Notable for the following components:   Ketones, ur 5 (*)    All other components within normal limits  SARS CORONAVIRUS 2 BY RT PCR  ETHANOL  LIPASE, BLOOD   ____________________________________________  RADIOLOGY  CT Head Wo Contrast  Result Date: 12/24/2022 CLINICAL DATA:  Altered mental status. EXAM: CT HEAD WITHOUT CONTRAST TECHNIQUE: Contiguous axial images were obtained from the base  of the skull through the vertex without intravenous contrast. RADIATION DOSE REDUCTION: This exam was performed according to the departmental dose-optimization program which includes automated exposure control, adjustment of the mA and/or kV according to patient size and/or use of iterative reconstruction technique. COMPARISON:  December 17, 2022 FINDINGS: Brain: There is mild cerebral atrophy with widening of the extra-axial spaces and ventricular dilatation. There are areas of decreased attenuation within the white matter tracts of the supratentorial brain, consistent with microvascular disease changes. Vascular: No hyperdense vessel or unexpected calcification. Skull: Normal. Negative for fracture or focal lesion. Sinuses/Orbits: No acute finding. Other: None. IMPRESSION: 1. Generalized cerebral atrophy with mild, chronic white matter small vessel ischemic changes. 2. No acute intracranial abnormality. Electronically Signed   By: Aram Candela M.D.   On: 12/24/2022 00:40    ____________________________________________   PROCEDURES  Procedure(s) performed:   Procedures  None  ____________________________________________   INITIAL IMPRESSION / ASSESSMENT AND PLAN / ED COURSE  Pertinent labs & imaging results that were available during my care of the patient were reviewed by me and considered in my medical decision making (see chart for details).   This patient is Presenting for Evaluation of AMS, which does require a range of treatment options, and is a complaint that involves a high risk of morbidity and mortality.  The Differential Diagnoses includes but is not exclusive to alcohol, illicit or prescription medications, intracranial pathology such as stroke, intracerebral hemorrhage, fever or infectious causes including sepsis, hypoxemia, uremia, trauma, endocrine related disorders such as diabetes, hypoglycemia, thyroid-related diseases, etc.  I decided to review pertinent External Data,  and in summary BH note from today reviewed. BH recommending inpatient geri phsych placement.    Clinical Laboratory Tests Ordered, included UA without urinary tract infection.  CBC without leukocytosis.  No acute kidney injury.  EtOH negative.  Radiologic Tests Ordered, included CT head without acute finding. I independently interpreted the images and agree with radiology interpretation.   Cardiac Monitor Tracing which shows NSR.    Social Determinants of Health Risk patient has a smoking history.   Medical Decision Making: Summary:  Patient presents emergency department for medical clearance.  He does not appear particularly confused to me.  He is able to provide a reasonable history.  He is not wandering or causing a behavioral disturbance here in the emergency department.  I did repeat lab work and CT imaging of the head which is unremarkable.  He had an MRI brain at the end of July which is similarly unremarkable.  At this point, patient is medically cleared for [facility] psych placement as per plan with [facility].   Patient's presentation is most consistent with acute presentation with potential threat to life or bodily function.   Disposition: admit - Geri psych  ____________________________________________  FINAL CLINICAL IMPRESSION(S) / ED DIAGNOSES  Final diagnoses:  Altered mental status, unspecified altered mental status type    Note:  This document was prepared using Dragon voice recognition software and may include unintentional dictation errors.  Alona Bene, MD, [facility] Emergency Medicine    , Arlyss Repress, MD 12/24/22 [phone]

## 2022-12-23 NOTE — ED Provider Notes (Signed)
Behavioral Health Urgent Care Medical Screening Exam  Patient Name: Kerry [name] MRN: [id number] Date of Evaluation: 12/23/22 Chief Complaint:  psychiatric evaluation Final diagnoses:  Altered mental status, unspecified altered mental status type    History of Present illness: Kerry [name] prefers to be called Kerry [name]) is a 77 y.o. male with a history of bipolar disorder, generalized anxiety disorder, bipolar disorder, sleep apnea, seizure disorder and pancreatic cancer stage III presenting to the [facility] UC from his independent living facility, [place] via [employer].  Nurse practitioner assessed patient face-to-face and reviewed his chart.  Patient is alert oriented, but is very hard of hearing, speech is clear and coherent, cooperative but appears restless and slightly agitated. Patient does not appear to be confused. Patient states that he does not feel right and that he feels  "antsy like he has ants in his pants".  During the assessment patient is able to sit for minute or two then starts walking around the room or walking out the room.  Patient denies any SI/HI/AVH.  Patient does not appear to be responding to any internal or external stimuli at this time.   Patient is pleasant and cooperative but states that he want to go smoke patient requested the nurse practitioner speak with his assistant [name] [phone].  Ms. Thomasena Edis stated that she has been his assistant and been working with him for about 6 months and that the last 2 weeks patient has been having concerning bizarre behaviors that have been inconsistent with his baseline.  Ms. Thomasena Edis stated patient has not been sleeping at night leaving out of the independent living facility at night which causes him to get locked out, harassing the young girls that work at the facility and touching some of the women residents, giving his money away to strangers.  Ms. Thomasena Edis stated that she recently became the medical POA for [place].    Patient will be transferred to [facility] for a medical workup with significant change from his baseline behavior. Nurse Practitioner spoke Dr. Anitra Lauth and she is in agreement that patient needs a medical work up and has accepted patient to [facility].   Flowsheet Row ED from 12/23/2022 in [facility] ED from 12/18/2022 in [facility] Emergency Department at [facility] ED from 12/17/2022 in [facility] Emergency Department at [facility]  C-SSRS RISK CATEGORY No Risk No Risk No Risk       Psychiatric Specialty Exam  Presentation  General Appearance:Casual  Eye Contact:Fleeting  Speech:Clear and Coherent  Speech Volume:Normal  Handedness:Right   Mood and Affect  Mood: Depressed  Affect: Congruent   Thought Process  Thought Processes: Coherent  Descriptions of Associations:Intact  Orientation:Full (Time, Place and Person)  Thought Content:WDL  Diagnosis of Schizophrenia or Schizoaffective disorder in past: No   Hallucinations:None  Ideas of Reference:None  Suicidal Thoughts:No  Homicidal Thoughts:No   Sensorium  Memory: Immediate Poor; Remote Poor; Recent Poor  Judgment: Poor  Insight: Poor   Executive Functions  Concentration: Poor  Attention Span: Fair  Recall: Fair  Fund of Knowledge: Fair  Language: Fair   Psychomotor Activity  Psychomotor Activity: Normal   Assets  Assets: Desire for Improvement; Housing; Resilience; Transportation   Sleep  Sleep: Poor  Number of hours:  3   Physical Exam: Physical Exam HENT:     Head: Normocephalic and atraumatic.     Nose: Nose normal.  Eyes:     Pupils: Pupils are equal, round, and  reactive to light.  Cardiovascular:     Rate and Rhythm: Normal rate.  Pulmonary:     Effort: Pulmonary effort is normal.  Abdominal:     General: Abdomen is flat.  Musculoskeletal:        General: Normal range of motion.  Skin:    General:  Skin is warm.  Neurological:     Mental Status: He is alert and oriented to person, place, and time.  Psychiatric:        Attention and Perception: He is inattentive.        Mood and Affect: Mood is anxious.        Speech: Speech normal.        Behavior: Behavior is cooperative.        Thought Content: Thought content normal.        Cognition and Memory: Cognition is impaired.        Judgment: Judgment is impulsive.    Review of Systems  Constitutional: Negative.   HENT: Negative.    Eyes: Negative.   Respiratory: Negative.    Cardiovascular: Negative.   Gastrointestinal: Negative.   Genitourinary: Negative.   Musculoskeletal: Negative.   Skin: Negative.   Neurological: Negative.   Endo/Heme/Allergies: Negative.   Psychiatric/Behavioral:  The patient is nervous/anxious.    Blood pressure 103/60, pulse 65, temperature 98 F (36.7 C), resp. rate 20, SpO2 98%. There is no height or weight on file to calculate BMI.  Musculoskeletal: Strength & Muscle Tone: decreased Gait & Station: unsteady Patient leans: N/A   [facility] MSE Discharge Disposition for Follow up and Recommendations: Based on my evaluation the patient appears to have an emergency medical condition for which I recommend the patient be transferred to the emergency department for further evaluation.  Patient will be transferred to [facility] ED for medical clearance.    Jasper Riling, NP 12/23/2022, 10:31 PM

## 2022-12-23 NOTE — Progress Notes (Signed)
   12/23/22 2005  [facility] Triage Screening (Walk-ins at [facility] only)  How Did You Hear About [place]? Legal System  What Is the Reason for Your Visit/Call Today? Patient presents to [facility] voluntarily by [employer]. It was reported patient is exhibiting erratic behaviors at assisted living facility and hasnt slept in 5 days. Patient does not endorse active SI/HI at this time. Patient is fairly groomed and cooperative. Patient appears to have diminished mental capacity. Patient is routine.  How Long Has This Been Causing You Problems? <Week  Have You Recently Had Any Thoughts About Hurting Yourself? No  Are You Planning to Commit Suicide/Harm Yourself At This time? No  Have you Recently Had Thoughts About Hurting Someone Kerry [name]? No  Are You Planning To Harm Someone At This Time? No  Are you currently experiencing any auditory, visual or other hallucinations? No  Have You Used Any Alcohol or Drugs in the Past 24 Hours? No  Do you have any current medical co-morbidities that require immediate attention? No  Clinician description of patient physical appearance/behavior: Patient is cam and cooperative.  What Do You Feel Would Help You the Most Today? Social Support  If access to [name] Urgent Care was not available, would you have sought care in the Emergency Department? Yes  Determination of Need Routine (7 days)  Options For Referral Geropsychiatric Facility

## 2022-12-23 NOTE — ED Notes (Signed)
Report called to [facility] - Press photographer at [facility]

## 2022-12-23 NOTE — Progress Notes (Signed)
   12/23/22 2005  [facility] Triage Screening (Walk-ins at [facility] only)  How Did You Hear About [place]? Legal System  What Is the Reason for Your Visit/Call Today? Patient presents to [facility] voluntarily by [employer]. Itwas reported patient is exhibiting erratic behaviors at assisted living facility and hasnt slept in 5 days. Patient is does not endorse active SI/HI at this time. Patient is fairl groomed and cooperative. Patient apperas to have decline in mental capacity. Patient is routine.  How Long Has This Been Causing You Problems? <Week  Have You Recently Had Any Thoughts About Hurting Yourself? No  Are You Planning to Commit Suicide/Harm Yourself At This time? No  Have you Recently Had Thoughts About Hurting Someone Karolee Ohs? No  Are You Planning To Harm Someone At This Time? No  Are you currently experiencing any auditory, visual or other hallucinations? No  Have You Used Any Alcohol or Drugs in the Past 24 Hours? No  Do you have any current medical co-morbidities that require immediate attention? No  Clinician description of patient physical appearance/behavior: Patient is cam and cooperative.  What Do You Feel Would Help You the Most Today? Social Support  If access to [facility] Urgent Care was not available, would you have sought care in the Emergency Department? Yes  Determination of Need Routine (7 days)  Options For Referral Geropsychiatric Facility

## 2022-12-23 NOTE — BH Assessment (Signed)
Comprehensive Clinical Assessment (CCA) Note  12/23/2022 Kerry [name] [id number]  DISPOSITION: Gave clinical report to Kerry [name] who completed MSE and determined Pt meets criteria for inpatient geriatric psychiatry.   The patient demonstrates the following risk factors for suicide: Chronic risk factors for suicide include: psychiatric disorder of bipolar I disorder, PTSD and medical illness pancreatic cancer . Acute risk factors for suicide include: N/A. Protective factors for this patient include: positive therapeutic relationship, responsibility to others (children, family), and hope for the future. Considering these factors, the overall suicide risk at this point appears to be low. Patient is not appropriate for outpatient follow up due to manic symptoms.  Pt is a 77 year old divorced male who presents unaccompanied to [facility] voluntarily via Kerry [name]. Pt states he was brought to [facility] "Because people say I'm not acting normal." Pt agrees he has not been behaving like his usual self but is unable to articulate the change other than to say "not normal." He paces during assessment and states he is unable to sit still. He says he has not been sleeping at night, that he paces, and sleeps very little during the day. He says he feels tire but is unable to rest. He describes his mood as anxious and says, "I am worried about dying." He denies current  suicidal ideation or history of suicide attempts. 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 He denies experiencing auditory or visual hallucinations. He says he has not drank alcohol in many years and does not abuse substances.  Pt says he has been diagnosed with bipolar disorder, PTSD, and anxiety. He says he receives medication management through the [place]. He says he takes medications as prescribed He says he has been residing at [facility] for the past 5 months and says, "I love it there." He says he has one daughter, who is a doctor and is supportive. He reports a history of experiencing combat trauma while serving in the [employer]. He denies current legal problems. He denies access to firearms.  With Pt's consent, TTS contacted Kerry [name] at [facility] at [phone]. He confirms Pt has resided there for approximately 5 months. He clarifies that [place] is an independent living facility, not assisted living. He says Pt's behavior "has dramatically changed in the past three weeks." He confirms Pt is awake all night and goes outside. He says Pt is giving his food to other residents and becomes easily irritated. He says while on a trip to [place], Pt was handing out money to strangers. Kerry [name] states He says Pt has been making inappropriate sexual comments to females. Pt has been taken to the ED for evaluation but has returned home with no improvement in his mental status.   Pt's medical record indicates Pt presented to [facility] ED on 12/17/2022 and was evaluated by psychiatry on 12/19/2022. Per medical record, he has a history of anxiety, bipolar disorder, seizure disorder, pancreatic cancer stage 3. Pt's oncology team has discontinued medication because of his new behaviors. At that time, he did not meet criteria for inpatient psychiatric treatment and was psychiatrically cleared for discharge.  Pt is casually dressed, alert and oriented x4. Pt has a hearing impairment but was able to understand speech at elevated volume. He speaks in a clear tone, at moderate volume and normal pace. Motor behavior appears restless and  Pt paced during the assessment. He frequently left the consult room and made sexually inappropriate comments to a male in another consult room. Eye contact is good. Pt's mood is anxious and affect is congruent with mood. Thought process is coherent and  relevant. There is no indication Pt is currently responding to internal stimuli. He is cooperative but says he needs to return to [place] by 6 pm on 12/25/2022.   Chief Complaint:  Chief Complaint  Patient presents with   Fatigue   Memory Loss   Visit Diagnosis: F31.13 Bipolar I disorder, current episode manic   CCA Screening, Triage and Referral (STR)  Patient Reported Information How did you hear about [place]? Other (Comment) (Independent living facility)  What Is the Reason for Your Visit/Call Today? Patient presents to [facility] voluntarily by [facility]. It was reported patient is exhibiting erratic behaviors at independent living facility and hasnt slept in 5 days. Patient does not endorse active SI/HI at this time. Patient is fairly groomed and cooperative.  How Long Has This Been Causing You Problems? 1 wk - 1 month  What Do You Feel Would Help You the Most Today? Treatment for Depression or other mood problem   Have You Recently Had Any Thoughts About Hurting Yourself? No  Are You Planning to Commit Suicide/Harm Yourself At This time? No   Flowsheet Row ED from 12/23/2022 in [facility] ED from 12/18/2022 in [facility] Emergency Department at [facility] ED from 12/17/2022 in [facility] Emergency Department at [facility]  C-SSRS RISK CATEGORY No Risk No Risk No Risk       Have you Recently Had Thoughts About Hurting Someone Kerry [name]? No  Are You Planning to Harm Someone at This Time? No  Explanation: Pt denies suicidal ideation or homicidal ideation   Have You Used Any Alcohol or Drugs in the Past 24 Hours? No  What Did You Use and How Much? Pt denies alcohol or substance use.   Do You Currently Have a Therapist/Psychiatrist? Yes  Name of Therapist/Psychiatrist: Name of Therapist/Psychiatrist: Pt states [facility]   Have You Been Recently Discharged From Any Office  Practice or Programs? No  Explanation of Discharge From Practice/Program: Pt has not recently been discharged from a practice.     CCA Screening Triage Referral Assessment Type of Contact: Face-to-Face  Telemedicine Service Delivery:   Is this Initial or Reassessment?   Date Telepsych consult ordered in [facility]:    Time Telepsych consult ordered in [facility]:    Location of Assessment: [facility]  Provider Location: [facility] Assessment Services   Collateral Involvement: Kerry Eleuterio, staff at [employer] [phone]   Does Patient Have a Court Appointed Legal Guardian? No  Legal Guardian Contact Information: Pt dneies not have a legal guardian  Copy of Legal Guardianship Form: -- (Pt dneies not have a legal guardian)  Legal Guardian Notified of Arrival: -- (Pt dneies not have a legal guardian)  Legal Guardian Notified of Pending Discharge: -- (Pt dneies not have a legal guardian)  If Minor and Not Living with Parent(s), Who has Custody? Pt is an adult  Is CPS involved or ever been involved? Never  Is APS involved or ever been involved? Never   Patient Determined To Be At Risk for Harm To Self or Others Based on Review of Patient Reported Information or Presenting Complaint? No  Method: No Plan  Availability  of Means: No access or NA  Intent: Vague intent or NA  Notification Required: No need or identified person  Additional Information for Danger to Others Potential: -- (None)  Additional Comments for Danger to Others Potential: Pt has no history of violence  Are There Guns or Other Weapons in Your Home? No  Types of Guns/Weapons: Pt does not have access to firearms  Are These Weapons Safely Secured?                            -- (Pt does not have access to firearms)  Who Could Verify You Are Able To Have These Secured: Pt lives in [facility]  Do You Have any Art therapist, Pending Court  Dates, Parole/Probation? Pt denies legal problems  Contacted To Inform of Risk of Harm To Self or Others: Other: Comment (No imminent risk to self or others)    Does Patient Present under Involuntary Commitment? No data recorded   [place] of Residence: No data recorded  Patient Currently Receiving the Following Services: Medication Management   Determination of Need: Urgent (48 hours)   Options For Referral: Geropsychiatric Facility; Medication Management; BH Urgent Care     CCA Biopsychosocial Patient Reported Schizophrenia/Schizoaffective Diagnosis in Past: No   Strengths: Pt is able to articulate his feelings. He participates in outpatient treatment.   Mental Health Symptoms Depression:   Increase/decrease in appetite; Irritability; Sleep (too much or little)   Duration of Depressive symptoms:  Duration of Depressive Symptoms: Greater than two weeks   Mania:   Change in energy/activity; Increased Energy; Irritability; Recklessness   Anxiety:    Tension; Irritability   Psychosis:   None   Duration of Psychotic symptoms:    Trauma:   Avoids reminders of event   Obsessions:   None   Compulsions:   None   Inattention:   None   Hyperactivity/Impulsivity:   None   Oppositional/Defiant Behaviors:   None   Emotional Irregularity:   None   Other Mood/Personality Symptoms:   Pt says he has been diagnosed with bipolar disorder    Mental Status Exam Appearance and self-care  Stature:   Average   Weight:   Average weight   Clothing:   Casual   Grooming:   Normal   Cosmetic use:   None   Posture/gait:   Normal; Stooped   Motor activity:   Restless   Sensorium  Attention:   Normal   Concentration:   Variable   Orientation:   X5   Recall/memory:   Normal   Affect and Mood  Affect:   Anxious   Mood:   Anxious   Relating  Eye contact:   Normal   Facial expression:   Responsive   Attitude toward [name]:    Cooperative   Thought and Language  Speech flow:  Normal   Thought content:   Appropriate to Mood and Circumstances   Preoccupation:   None   Hallucinations:   None   Organization:   Coherent   [employer] of Knowledge:   Average   Intelligence:   Average   Abstraction:   Normal   Judgement:   Poor   Reality Testing:   Adequate   Insight:   Gaps   Decision Making:   Impulsive   Social Functioning  Social Maturity:   Responsible; Impulsive   Social Judgement:   Normal   Stress  Stressors:  Illness   Coping Ability:   Overwhelmed   Skill Deficits:   None   Supports:   Family     Religion: Religion/Spirituality Are You A Religious Person?: Yes What is Your Religious Affiliation?: Baptist How Might This Affect Treatment?: Pt says he recently started attending church  Leisure/Recreation: Leisure / Recreation Do You Have Hobbies?: Yes Leisure and Hobbies: Therapist, music  Exercise/Diet: Exercise/Diet Do You Exercise?: Yes What Type of Exercise Do You Do?: Run/Walk How Many Times a Week Do You Exercise?: 6-7 times a week Have You Gained or Lost A Significant Amount of Weight in the Past Six Months?: No Do You Follow a Special Diet?: No Do You Have Any Trouble Sleeping?: Yes Explanation of Sleeping Difficulties: Pt says he has been awake all night and sleeping little during the day.   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  Childhood History:  Childhood History By whom was/is the patient raised?: Both parents Did patient suffer any verbal/emotional/physical/sexual abuse as a child?: No Did patient suffer from severe childhood neglect?: No Has patient ever been sexually abused/assaulted/raped as an adolescent or adult?: No Was the patient ever a victim of a crime or a disaster?: No Witnessed domestic violence?: No Has patient been affected by domestic violence as an adult?: No       CCA Substance Use Alcohol/Drug Use: Alcohol / Drug Use Pain Medications: Denies abuse Prescriptions: Denies abuse Over the Counter: Denies abuse History of alcohol / drug use?: No history of alcohol / drug abuse Longest period of sobriety (when/how long): NA Negative Consequences of Use:  (NA) Withdrawal Symptoms: None                         ASAM's:  Six Dimensions of Multidimensional Assessment  Dimension 1:  Acute Intoxication and/or Withdrawal Potential:   Dimension 1:  Description of individual's past and current experiences of substance use and withdrawal: Pt denies alcohol or substance use  Dimension 2:  Biomedical Conditions and Complications:   Dimension 2:  Description of patient's biomedical conditions and  complications: Pt denies alcohol or substance use  Dimension 3:  Emotional, Behavioral, or Cognitive Conditions and Complications:  Dimension 3:  Description of emotional,  behavioral, or cognitive conditions and complications: Pt denies alcohol or substance use  Dimension 4:  Readiness to Change:  Dimension 4:  Description of Readiness to Change criteria: Pt denies alcohol or substance use  Dimension 5:  Relapse, Continued use, or Continued Problem Potential:  Dimension 5:  Relapse, continued use, or continued problem potential critiera description: Pt denies alcohol  or substance use  Dimension 6:  Recovery/Living Environment:  Dimension 6:  Recovery/Iiving environment criteria description: Pt denies alcohol or substance use  ASAM Severity Score: ASAM's Severity Rating Score: 0  ASAM Recommended Level of Treatment: ASAM Recommended Level of Treatment:  (NA)   Substance use Disorder (SUD) Substance Use Disorder (SUD)  Checklist Symptoms of Substance Use:  (NA)  Recommendations for Services/Supports/Treatments: Recommendations for Services/Supports/Treatments Recommendations For Services/Supports/Treatments:  (NA)  Discharge Disposition: Discharge Disposition Medical Exam completed: Yes  DSM5 Diagnoses: Patient Active Problem List   Diagnosis Date Noted   Aggressive behavior 11/22/2021   Status epilepticus (HCC) 11/11/2021   Postictal confusion 11/11/2021   Altered mental status 11/02/2021   Generalized weakness    Jaundice    Dark stools 10/24/2021   Serum ammonia increased (HCC) 10/22/2021   Mixed hyperlipidemia 10/21/2021   Restless leg syndrome 10/21/2021   BPH (benign prostatic hyperplasia) 10/21/2021   Seizure disorder (HCC) 10/21/2021   Common bile duct obstruction    Pancreatic lesion    Weight loss    Cholestatic jaundice 10/20/2021   Bipolar I disorder (HCC) 10/20/2021   Hypokalemia 10/20/2021   Lesion of left native kidney 10/20/2021     Referrals to Alternative Service(s): Referred to Alternative Service(s):   Place:   Date:   Time:    Referred to Alternative Service(s):   Place:   Date:   Time:    Referred to Alternative  Service(s):   Place:   Date:   Time:    Referred to Alternative Service(s):   Place:   Date:   Time:     Pamalee Leyden, [facility]

## 2022-12-24 ENCOUNTER — Emergency Department (HOSPITAL_COMMUNITY): Payer: No Typology Code available for payment source

## 2022-12-24 ENCOUNTER — Encounter (HOSPITAL_COMMUNITY): Payer: Self-pay

## 2022-12-24 ENCOUNTER — Other Ambulatory Visit: Payer: Self-pay

## 2022-12-24 DIAGNOSIS — R404 Transient alteration of awareness: Secondary | ICD-10-CM

## 2022-12-24 MED ORDER — PANTOPRAZOLE SODIUM 40 MG PO TBEC
40.0000 mg | DELAYED_RELEASE_TABLET | Freq: Every day | ORAL | Status: DC
  Administered 2022-12-24: 40 mg via ORAL
  Filled 2022-12-24: qty 1

## 2022-12-24 MED ORDER — LEVETIRACETAM 750 MG PO TABS
1500.0000 mg | ORAL_TABLET | Freq: Two times a day (BID) | ORAL | Status: DC
  Administered 2022-12-24 (×2): 1500 mg via ORAL
  Filled 2022-12-24: qty 2
  Filled 2022-12-24: qty 3

## 2022-12-24 MED ORDER — TRAZODONE HCL 50 MG PO TABS: 75.0000 mg | ORAL_TABLET | Freq: Every evening | ORAL | Status: DC | PRN

## 2022-12-24 MED ORDER — ROSUVASTATIN CALCIUM 5 MG PO TABS: 5.0000 mg | ORAL_TABLET | Freq: Every day | ORAL | Status: DC

## 2022-12-24 MED ORDER — QUETIAPINE FUMARATE 100 MG PO TABS: 100.0000 mg | ORAL_TABLET | Freq: Every day | ORAL | Status: DC

## 2022-12-24 MED ORDER — NICOTINE 14 MG/24HR TD PT24
14.0000 mg | MEDICATED_PATCH | Freq: Every day | TRANSDERMAL | Status: DC
  Administered 2022-12-24: 14 mg via TRANSDERMAL
  Filled 2022-12-24: qty 1

## 2022-12-24 MED ORDER — ASPIRIN 81 MG PO TBEC
81.0000 mg | DELAYED_RELEASE_TABLET | Freq: Every day | ORAL | Status: DC
  Administered 2022-12-24: 81 mg via ORAL
  Filled 2022-12-24: qty 1

## 2022-12-24 MED ORDER — ESCITALOPRAM OXALATE 10 MG PO TABS
5.0000 mg | ORAL_TABLET | Freq: Every day | ORAL | Status: DC
  Administered 2022-12-24: 5 mg via ORAL
  Filled 2022-12-24: qty 1

## 2022-12-24 MED ORDER — DONEPEZIL HCL 5 MG PO TABS
5.0000 mg | ORAL_TABLET | Freq: Every morning | ORAL | Status: DC
  Administered 2022-12-24: 5 mg via ORAL
  Filled 2022-12-24: qty 1

## 2022-12-24 MED ORDER — FINASTERIDE 5 MG PO TABS
5.0000 mg | ORAL_TABLET | Freq: Every day | ORAL | Status: DC
  Administered 2022-12-24: 5 mg via ORAL
  Filled 2022-12-24: qty 1

## 2022-12-24 MED ORDER — ROPINIROLE HCL 1 MG PO TABS
1.0000 mg | ORAL_TABLET | Freq: Every day | ORAL | Status: DC
  Filled 2022-12-24: qty 1

## 2022-12-24 MED ORDER — DIVALPROEX SODIUM ER 500 MG PO TB24: 1000.0000 mg | ORAL_TABLET | Freq: Every day | ORAL | Status: DC

## 2022-12-24 NOTE — Progress Notes (Signed)
CSW spoke with patients caretaker [name] [phone] who told CSW that she is not sure [place] will take patient back. Ms. Thomasena Edis stated she has taken patient to the hospital several times and has been told the same thing each time. Ms. Thomasena Edis asked CSW if she can find another place for patient to go. CSW advised Ms. Collins that patient would need to discharge back home and they can look for outpatient placement. CSW advised she might have to go through the [place] for assistance. Ms. Thomasena Edis told CSW she would call her back and let her know.    CSW contacted [place] [phone] and spoke to [employer] and [employer]. Thayer Ohm stated he was concerns with patients recent behaviors and that patient doesn't typically act this way. Thayer Ohm stated patient has been making inappropriate comments to male staff. Thayer Ohm stated he is worried patient will come back and have the same behavior. CS[facility] did [facility] to [facility] and if he needs a higher level of care such as memory care. Thayer Ohm stated he would discuss those things with his caretaker. Thayer Ohm stated that patient can return but he will call EMS if patients behaviors continue.    Case management contacted the [facility], Patients social worker is Mallie Darting [phone]  EXT: [phone]. CSW left a message with [facility] requesting a call back to see if she can assist patient.

## 2022-12-24 NOTE — Consult Note (Signed)
[facility] ED ASSESSMENT   Reason for Consult:  ?is patient going to [facility] geropsych unit.  on chart review, it appears same issues/behaviors for atleast 3 years, and his meds not changed - as such, is inpatient geropsych beneficial? It does seem patient may need TOC placement to ALF or dementia unit Referring Physician:  Cathren Laine Patient Identification: Tenoch Lowary MRN:  [id number] ED Chief Complaint: Altered mental status  Diagnosis:  Principal Problem:   Altered mental status   ED Assessment Time Calculation: Start Time: 1200 Stop Time: 1330 Total Time in Minutes (Assessment Completion): 90  HPI:  Mikiyas Menter is a 77 y.o. male patient brought over from [facility] by EMS. He has a history of anxiety, bipolar disorder, seizure disorder, pancreatic cancer stage 3 who presented with increased agitation and confusion. Per chart review, patient lives at an independent living facility and has been brought to the emergency department multiple times for complaints of wondering or aggressive behavior.   Subjective:   Ladarren Estime is a 77 y.o. male patient brought over from [facility] by EMS. He has a history of anxiety, bipolar disorder, seizure disorder, pancreatic cancer stage 3 who presented with increased agitation and confusion. Per chart review, patient lives at an independent living facility and has been brought to the emergency department multiple times for complaints of wondering or aggressive behavior.    Patient is seen face to face this after noon at [facility] in his emergency room resting comfortably on a stretcher.  He is cooperative and pleasant.  Patient is hard of hearing.  When asked why he in the hospital, patient states he is having some difficult with his short term memory.  He is able to report his medical history.  When asked about pain, he says his left should is hurts some, but that he "can live with it."  He is clear in his communication.  He does not display any confusion during his  assessment.  Patient said he slept well and that he is "feeling good."  Patient says he knows people at his independent living facility were upset that he hugged some women.  He says he loves women. Patient states "I've been told I'm not supposed to hug any women."  Patient states "I only hugged women that wanted hugged; I don't hug women that don't want hugged."  They ask me "where's my hug today? And now I can't do it. It's sad."  Patient also stated that the people at his independent living think he is "walking too much. I'm trying to build the strength up in my legs. I don't want to end up in a wheelchair."     [name] is alert/oriented x 4, calm, cooperative, attentive, and responses are relevant to assessment questions.  His mood is euthymic and congruent with affect.  He is speaking in a clear tone at moderate volume, and normal pace, with good eye contact. He denies suicidal ideation, homicidal ideation and auditory and visual hallucinations. Objectively:  there is no evidence of psychosis/mania, delusional thinking, or significant impairment.  He is able to converse coherently, with goal directed thoughts, and no distractibility, or pre-occupation.  He has responded appropriately to questions and has remained calm and cooperative throughout assessment.      After thorough evaluation and review of information currently presented on assessment of [name], there is insufficient findings to indicate patient meets criteria for involuntary commitment or that patient requires psychiatric hospitalization.  He is not at risk of harm  to himself or others.  Patient does not meet criteria for inpatient psychiatric hospitalization.  He is psychiatrically cleared.      Past Psychiatric History: DX of Bipolar Disorder and anxiety. Previous ED visits for disorientation and bizarre behavior   Risk to Self or Others: Is the patient at risk to self? No Has the patient been a risk to self in the  past 6 months? No Has the patient been a risk to self within the distant past? No Is the patient a risk to others? No Has the patient been a risk to others in the past 6 months? No Has the patient been a risk to others within the distant past? No  [place] Scale:  Flowsheet Row ED from 12/23/2022 in [facility] Emergency Department at [facility] Most recent reading at 12/24/2022  1:51 AM ED from 12/23/2022 in [facility] Most recent reading at 12/23/2022  8:08 PM ED from 12/18/2022 in [facility] Emergency Department at [facility] Most recent reading at 12/18/2022  2:24 PM  C-SSRS RISK CATEGORY No Risk No Risk No Risk       Substance Abuse:   Denies  Past Medical History:  Past Medical History:  Diagnosis Date   anxiety    Anxiety    Bipolar 1 disorder (HCC)    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   Dementia (HCC)    Seizures (HCC)    Sleep apnea     Past Surgical History:  Procedure Laterality Date   BILIARY BRUSHING  10/23/2021   Procedure: BILIARY BRUSHING;  Surgeon: Lemar Lofty., MD;  Location: [facility] ENDOSCOPY;  Service: Gastroenterology;;   BILIARY STENT PLACEMENT  10/23/2021   Procedure: BILIARY STENT PLACEMENT;  Surgeon: Lemar Lofty., MD;  Location: [facility] ENDOSCOPY;  Service: Gastroenterology;;   BIOPSY  10/23/2021   Procedure: BIOPSY;  Surgeon: Lemar Lofty., MD;  Location: [facility] ENDOSCOPY;  Service: Gastroenterology;;   ERCP N/A 10/23/2021   Procedure: ENDOSCOPIC RETROGRADE CHOLANGIOPANCREATOGRAPHY (ERCP);  Surgeon: Lemar Lofty., MD;  Location: [facility] ENDOSCOPY;  Service: Gastroenterology;  Laterality: N/A;   ESOPHAGOGASTRODUODENOSCOPY (EGD) WITH PROPOFOL N/A 10/23/2021   Procedure: ESOPHAGOGASTRODUODENOSCOPY (EGD) WITH PROPOFOL;  Surgeon: Meridee Score Netty Starring., MD;  Location: [facility] ENDOSCOPY;  Service: Gastroenterology;  Laterality: N/A;   FINE NEEDLE ASPIRATION  10/23/2021   Procedure: FINE NEEDLE  ASPIRATION (FNA) LINEAR;  Surgeon: Lemar Lofty., MD;  Location: [facility] ENDOSCOPY;  Service: Gastroenterology;;   REMOVAL OF STONES  10/23/2021   Procedure: REMOVAL OF SLUDGE;  Surgeon: Lemar Lofty., MD;  Location: [facility] ENDOSCOPY;  Service: Gastroenterology;;   [name]  10/23/2021   Procedure: [name];  Surgeon: Lemar Lofty., MD;  Location: [facility] ENDOSCOPY;  Service: Gastroenterology;;   UPPER ESOPHAGEAL ENDOSCOPIC ULTRASOUND (EUS) N/A 10/23/2021   Procedure: UPPER ESOPHAGEAL ENDOSCOPIC ULTRASOUND (EUS);  Surgeon: Lemar Lofty., MD;  Location: [facility] ENDOSCOPY;  Service: Gastroenterology;  Laterality: N/A;   Family History:  Family History  Problem Relation Age of Onset   Heart disease Neg Hx    Family Psychiatric  History: None noted by patient Social History:  Social History   Substance and Sexual Activity  Alcohol Use Never     Social History   Substance and Sexual Activity  Drug Use Never    Social History   Socioeconomic History   Marital status: Divorced    Spouse name: Not on file   Number of children: Not on file  Years of education: Not on file   Highest education level: Not on file  Occupational History   Not on file  Tobacco Use   Smoking status: Every Day    Current packs/day: 2.00    Types: Cigarettes   Smokeless tobacco: Never  Vaping Use   Vaping status: Never Used  Substance and Sexual Activity   Alcohol use: Never   Drug use: Never   Sexual activity: Not on file  Other Topics Concern   Not on file  Social History Narrative   Not on file   Social Determinants of Health   Financial Resource Strain: Medium Risk (08/28/2022)   Received from [facility]   Overall Financial Resource Strain (CARDIA)    Difficulty of Paying Living Expenses: Somewhat hard  Food Insecurity: No Food Insecurity (12/08/2022)   Hunger Vital Sign    Worried About Running Out of Food in the Last Year: Never true    Ran  Out of Food in the Last Year: Never true  Transportation Needs: No Transportation Needs (12/08/2022)   PRAPARE - Administrator, Civil Service (Medical): No    Lack of Transportation (Non-Medical): No  Physical Activity: Not on file  Stress: Stress Concern Present (08/21/2022)   Received from [facility], [facility] - Occupational Stress Questionnaire    Feeling of Stress : To some extent  Social Connections: Unknown (10/02/2021)   Received from [facility]   Social Network    Social Network: Not on file   Additional Social History: Lives in an independent living facility    Allergies:   Allergies  Allergen Reactions   Fluoride Other (See Comments)    Seizures and stroke after dental visit from using fluoride paste (per pt)     Labs:  Results for orders placed or performed during the hospital encounter of 12/23/22 (from the past 48 hour(s))  Acetaminophen level     Status: Abnormal   Collection Time: 12/23/22 11:48 PM  Result Value Ref Range   Acetaminophen (Tylenol), Serum <10 (L) 10 - 30 ug/mL    Comment: (NOTE) Therapeutic concentrations vary significantly. A range of 10-30 ug/mL  may be an effective concentration for many patients. However, some  are best treated at concentrations outside of this range. Acetaminophen concentrations >150 ug/mL at 4 hours after ingestion  and >50 ug/mL at 12 hours after ingestion are often associated with  toxic reactions.  Performed at [facility], [address], [place], [place] [address]   Comprehensive metabolic panel     Status: Abnormal   Collection Time: 12/23/22 11:48 PM  Result Value Ref Range   Sodium 141 135 - 145 mmol/L   Potassium 3.7 3.5 - 5.1 mmol/L   Chloride 103 98 - 111 mmol/L   CO2 26 22 - 32 mmol/L   Glucose, Bld 100 (H) 70 - 99 mg/dL    Comment: Glucose reference range applies only to samples taken after fasting for at least 8  hours.   BUN 20 8 - 23 mg/dL   Creatinine, Ser 6.04 0.61 - 1.24 mg/dL   Calcium 9.3 8.9 - 54.0 mg/dL   Total Protein 6.6 6.5 - 8.1 g/dL   Albumin 3.4 (L) 3.5 - 5.0 g/dL   AST 24 15 - 41 U/L   ALT 15 0 - 44 U/L   Alkaline Phosphatase 50 38 - 126 U/L   Total Bilirubin 0.6 0.3 -  1.2 mg/dL   GFR, Estimated >16 >10 mL/min    Comment: (NOTE) Calculated using the CKD-EPI Creatinine Equation (2021)    Anion gap 12 5 - 15    Comment: Performed at [facility], [address] [place], [place] [address]  Ethanol     Status: None   Collection Time: 12/23/22 11:48 PM  Result Value Ref Range   Alcohol, Ethyl (B) <10 <10 mg/dL    Comment: (NOTE) Lowest detectable limit for serum alcohol is 10 mg/dL.  For medical purposes only. Performed at [facility], [address] [place], [place] [address]   Lipase, blood     Status: None   Collection Time: 12/23/22 11:48 PM  Result Value Ref Range   Lipase 24 11 - 51 U/L    Comment: Performed at [facility], [address] [place], [place] [address]  CBC with Differential     Status: Abnormal   Collection Time: 12/23/22 11:48 PM  Result Value Ref Range   WBC 5.3 4.0 - 10.5 K/uL   RBC 3.32 (L) 4.22 - 5.81 MIL/uL   Hemoglobin 10.1 (L) 13.0 - 17.0 g/dL   HCT 82.9 (L) 56.2 - 13.0 %   MCV 93.4 80.0 - 100.0 fL   MCH 30.4 26.0 - 34.0 pg   MCHC 32.6 30.0 - 36.0 g/dL   RDW 86.5 78.4 - 69.6 %   Platelets 98 (L) 150 - 400 K/uL   nRBC 0.0 0.0 - 0.2 %   Neutrophils Relative % 60 %   Neutro Abs 3.2 1.7 - 7.7 K/uL   Lymphocytes Relative 26 %   Lymphs Abs 1.4 0.7 - 4.0 K/uL   Monocytes Relative 11 %   Monocytes Absolute 0.6 0.1 - 1.0 K/uL   Eosinophils Relative 2 %   Eosinophils Absolute 0.1 0.0 - 0.5 K/uL   Basophils Relative 1 %   Basophils Absolute 0.0 0.0 - 0.1 K/uL   Immature Granulocytes 0 %   Abs Immature Granulocytes 0.02 0.00 - 0.07 K/uL    Comment: Performed at [facility], [address], [place], [place]  [address]  Urine rapid drug screen (hosp performed)     Status: Abnormal   Collection Time: 12/24/22  1:35 AM  Result Value Ref Range   Opiates NONE DETECTED NONE DETECTED   Cocaine NONE DETECTED NONE DETECTED   Benzodiazepines POSITIVE (A) NONE DETECTED   Amphetamines NONE DETECTED NONE DETECTED   Tetrahydrocannabinol NONE DETECTED NONE DETECTED   Barbiturates NONE DETECTED NONE DETECTED    Comment: (NOTE) DRUG SCREEN FOR MEDICAL PURPOSES ONLY.  IF CONFIRMATION IS NEEDED FOR ANY PURPOSE, NOTIFY LAB WITHIN 5 DAYS.  LOWEST DETECTABLE LIMITS FOR URINE DRUG SCREEN Drug Class                     Cutoff (ng/mL) Amphetamine and metabolites    1000 Barbiturate and metabolites    200 Benzodiazepine                 200 Opiates and metabolites        300 Cocaine and metabolites        300 THC                            50 Performed at [facility], [address] [place], [place] [address]   Urinalysis, w/ Reflex to Culture (Infection Suspected) -Urine,  Clean Catch     Status: Abnormal   Collection Time: 12/24/22  1:35 AM  Result Value Ref Range   Specimen Source URINE, CLEAN CATCH    Color, Urine YELLOW YELLOW   APPearance CLEAR CLEAR   Specific Gravity, Urine 1.014 1.005 - 1.030   pH 6.0 5.0 - 8.0   Glucose, UA NEGATIVE NEGATIVE mg/dL   Hgb urine dipstick NEGATIVE NEGATIVE   Bilirubin Urine NEGATIVE NEGATIVE   Ketones, ur 5 (A) NEGATIVE mg/dL   Protein, ur NEGATIVE NEGATIVE mg/dL   Nitrite NEGATIVE NEGATIVE   Leukocytes,Ua NEGATIVE NEGATIVE   RBC / HPF 0-5 0 - 5 RBC/hpf   WBC, UA 0-5 0 - 5 WBC/hpf    Comment:        Reflex urine culture not performed if WBC <=10, OR if Squamous epithelial cells >5. If Squamous epithelial cells >5 suggest recollection.    Bacteria, UA NONE SEEN NONE SEEN   Squamous Epithelial / HPF 0-5 0 - 5 /HPF   Mucus PRESENT     Comment: Performed at [facility], [address] [place], [place] [address]  SARS Coronavirus 2 by RT PCR  (hospital order, performed in [facility] hospital lab) *cepheid single result test* Urine, Clean Catch     Status: None   Collection Time: 12/24/22  1:35 AM   Specimen: Urine, Clean Catch; Nasal Swab  Result Value Ref Range   SARS Coronavirus 2 by RT PCR NEGATIVE NEGATIVE    Comment: Performed at [facility], [address], [place], [place] [address]    Current Facility-Administered Medications  Medication Dose Route Frequency Provider Last Rate Last Admin   aspirin EC tablet 81 mg  81 mg Oral Daily Long, Arlyss Repress, MD   81 mg at 12/24/22 1057   divalproex (DEPAKOTE ER) 24 hr tablet 1,000 mg  1,000 mg Oral QHS Long, Arlyss Repress, MD       donepezil (ARICEPT) tablet 5 mg  5 mg Oral q morning Long, Arlyss Repress, MD   5 mg at 12/24/22 1058   escitalopram (LEXAPRO) tablet 5 mg  5 mg Oral Daily Long, Arlyss Repress, MD   5 mg at 12/24/22 1058   finasteride (PROSCAR) tablet 5 mg  5 mg Oral Daily Long, Arlyss Repress, MD   5 mg at 12/24/22 1057   levETIRAcetam (KEPPRA) tablet 1,500 mg  1,500 mg Oral BID Long, Arlyss Repress, MD   1,500 mg at 12/24/22 1057   nicotine (NICODERM CQ - dosed in mg/24 hours) patch 14 mg  14 mg Transdermal Daily Long, Arlyss Repress, MD   14 mg at 12/24/22 1058   pantoprazole (PROTONIX) EC tablet 40 mg  40 mg Oral Daily Long, Arlyss Repress, MD   40 mg at 12/24/22 1058   QUEtiapine (SEROQUEL) tablet 100 mg  100 mg Oral QHS Long, Arlyss Repress, MD       rOPINIRole (REQUIP) tablet 1 mg  1 mg Oral QHS Long, Arlyss Repress, MD       rosuvastatin (CRESTOR) tablet 5 mg  5 mg Oral QHS Long, Arlyss Repress, MD       traZODone (DESYREL) tablet 75 mg  75 mg Oral QHS PRN Long, Arlyss Repress, MD       Current Outpatient Medications  Medication Sig Dispense Refill   aspirin EC 81 MG tablet Take 1 tablet (81 mg total) by mouth daily. 30 tablet 12   Cholecalciferol (VITAMIN D) 50 MCG (2000 UT) tablet Take  2,000 Units by mouth at bedtime.     divalproex (DEPAKOTE ER) 500 MG 24 hr tablet Take 2 tablets (1,000 mg total) by mouth at  bedtime. 60 tablet 0   donepezil (ARICEPT) 5 MG tablet Take 5 mg by mouth every morning. For memory     escitalopram (LEXAPRO) 10 MG tablet Take 5 mg by mouth daily. For mental health     ferrous gluconate (FERGON) 324 MG tablet Take 324 mg by mouth daily. Take with Vitamin C/ascorbic acid     finasteride (PROSCAR) 5 MG tablet Take 5 mg by mouth daily. For overactive bladder     levETIRAcetam (KEPPRA) 750 MG tablet Take 2 tablets (1,500 mg total) by mouth 2 (two) times daily. 60 tablet 0   melatonin 3 MG TABS tablet Take 3 mg by mouth at bedtime.     pantoprazole (PROTONIX) 40 MG tablet Take 1 tablet (40 mg total) by mouth daily. 30 tablet 1   QUEtiapine (SEROQUEL) 100 MG tablet Take 100 mg by mouth at bedtime. For mood and sleep     rOPINIRole (REQUIP) 1 MG tablet Take 1 mg by mouth at bedtime.     rosuvastatin (CRESTOR) 10 MG tablet Take 0.5 tablets (5 mg total) by mouth at bedtime. For cholesterol     traZODone (DESYREL) 150 MG tablet Take 75 mg by mouth at bedtime as needed for sleep.     vitamin C (ASCORBIC ACID) 500 MG tablet Take 500 mg by mouth daily.     zolpidem (AMBIEN) 10 MG tablet Take 5 mg by mouth at bedtime as needed for sleep.     clotrimazole (LOTRIMIN) 1 % external solution Apply 1 application  topically daily as needed (For dystrophic nails).     Emollient (CETAPHIL) cream Apply 1 application. topically daily.     nicotine (NICODERM CQ - DOSED IN MG/24 HOURS) 14 mg/24hr patch Place 14 mg onto the skin daily.      Musculoskeletal: Strength & Muscle Tone: within normal limits Gait & Station: normal Patient leans: N/A   Psychiatric Specialty Exam: Presentation  General Appearance:  Appropriate for Environment  Eye Contact: Good  Speech: Clear and Coherent  Speech Volume: Normal  Handedness: Right   Mood and Affect  Mood: Depressed  Affect: Congruent   Thought Process  Thought Processes: Coherent  Descriptions of  Associations:Intact  Orientation:Full (Time, Place and Person)  Thought Content:WDL  History of Schizophrenia/Schizoaffective disorder:No  Duration of Psychotic Symptoms:No data recorded Hallucinations:Hallucinations: None  Ideas of Reference:None  Suicidal Thoughts:Suicidal Thoughts: No  Homicidal Thoughts:Homicidal Thoughts: No   Sensorium  Memory: Immediate Fair; Recent Fair; Remote Fair  Judgment: Intact  Insight: Fair   Art therapist  Concentration: Good  Attention Span: Good  Recall: Fair  Fund of Knowledge: Good  Language: Good   Psychomotor Activity  Psychomotor Activity: Psychomotor Activity: Normal   Assets  Assets: Communication Skills; Housing; Leisure Time; Social Support    Sleep  Sleep: Sleep: Fair Number of Hours of Sleep: 5   Physical Exam: Physical Exam Vitals and nursing note reviewed.  Eyes:     Pupils: Pupils are equal, round, and reactive to light.  Pulmonary:     Effort: Pulmonary effort is normal.  Skin:    General: Skin is dry.  Neurological:     Mental Status: He is alert and oriented to person, place, and time.    Review of Systems  Musculoskeletal:  Positive for joint pain.       Complains  of left shoulder pain  Psychiatric/Behavioral:  Positive for memory loss.   All other systems reviewed and are negative.  Blood pressure 129/64, pulse 66, temperature 98.3 F (36.8 C), temperature source Oral, resp. rate 17, SpO2 99%. There is no height or weight on file to calculate BMI.  Medical Decision Making: Patient case reviewed and discussed with Dr Lucianne Muss. After thorough evaluation and review of information currently presented on assessment of [name], there is insufficient findings to indicate patient meets criteria for involuntary commitment or that patient requires psychiatric hospitalization.  He is not at risk of harm to himself or others.  Patient does not meet criteria for inpatient  psychiatric hospitalization.  Problem 1: Altered Mental Status - Provide outpatient resources - Consult TOC  Disposition:  Patient is psychiatrically cleared.  Thomes Lolling, NP 12/24/2022 2:03 PM

## 2022-12-24 NOTE — Discharge Instructions (Addendum)
Thankfully there are no medical emergencies found on your workup today.  Be sure to take your medicines as prescribed and follow-up closely with your primary care physician.  If he develops any other new/concerning symptoms then return to the ER or call 911.

## 2022-12-24 NOTE — ED Notes (Signed)
Caregiver reports she doesn't have time to wait for staff to locate valuables and will come back once they are found.

## 2022-12-24 NOTE — ED Notes (Signed)
Pt verbally aggressive w/ staff and agitated.

## 2022-12-24 NOTE — ED Provider Notes (Signed)
Patient has been cleared by psychiatry.  He lives in assisted living and there is no indication to further keep him in the emergency department.  Sound like he has had some longstanding abnormal behavior that seems most likely to be dementia.  He can follow-up with PCP.  No emergent findings on workup during this ER stay.   Pricilla Loveless, MD 12/24/22 [phone]

## 2022-12-24 NOTE — ED Notes (Signed)
Belongings inventoried with security (phone, belt, cigarettes, cash).

## 2022-12-24 NOTE — ED Notes (Signed)
Located patient valuables in Security about 5PM.   Contacted caregiver to arrange pickup - Ms. Collins [phone]) will pickup Tues 12/25/22. Confusion due to whomever delivered valuables to Security did not get the receipt for items.

## 2022-12-24 NOTE — Discharge Planning (Signed)
Pt active at [facility] Provider: [place], [place] SW: Anda Kraft   Desk phone: [phone] Please call [facility] transfer coordinator for additional information [phone]

## 2022-12-24 NOTE — ED Notes (Signed)
Lunch given.

## 2022-12-24 NOTE — ED Notes (Signed)
Per noted by previous RN, "Belongings inventoried w/ security." Valuables were listed. Tried to locate valuables in security office x1 and unable to locate. Searched belongings bags that were found in locker 8 and valuables aren't there. Security attempting to look for belongings again.

## 2022-12-24 NOTE — Discharge Planning (Signed)
error 

## 2022-12-26 ENCOUNTER — Emergency Department (HOSPITAL_COMMUNITY)
Admission: EM | Admit: 2022-12-26 | Discharge: 2022-12-26 | Disposition: A | Discharge status: Home / Self Care | Payer: No Typology Code available for payment source | Attending: Emergency Medicine | Admitting: Emergency Medicine

## 2022-12-26 ENCOUNTER — Emergency Department (HOSPITAL_COMMUNITY): Payer: No Typology Code available for payment source

## 2022-12-26 DIAGNOSIS — Z7982 Long term (current) use of aspirin: Secondary | ICD-10-CM | POA: Insufficient documentation

## 2022-12-26 DIAGNOSIS — D649 Anemia, unspecified: Secondary | ICD-10-CM | POA: Insufficient documentation

## 2022-12-26 DIAGNOSIS — R4182 Altered mental status, unspecified: Secondary | ICD-10-CM | POA: Diagnosis not present

## 2022-12-26 DIAGNOSIS — R197 Diarrhea, unspecified: Secondary | ICD-10-CM | POA: Diagnosis not present

## 2022-12-26 DIAGNOSIS — E876 Hypokalemia: Secondary | ICD-10-CM

## 2022-12-26 LAB — CBC WITH DIFFERENTIAL/PLATELET
Abs Immature Granulocytes: 0.01 10*3/uL (ref 0.00–0.07)
Basophils Absolute: 0 10*3/uL (ref 0.0–0.1)
Basophils Relative: 1 %
Eosinophils Absolute: 0.1 10*3/uL (ref 0.0–0.5)
Eosinophils Relative: 1 %
HCT: 29.2 % — ABNORMAL LOW (ref 39.0–52.0)
Hemoglobin: 9.5 g/dL — ABNORMAL LOW (ref 13.0–17.0)
Immature Granulocytes: 0 %
Lymphocytes Relative: 19 %
Lymphs Abs: 0.8 10*3/uL (ref 0.7–4.0)
MCH: 30.1 pg (ref 26.0–34.0)
MCHC: 32.5 g/dL (ref 30.0–36.0)
MCV: 92.4 fL (ref 80.0–100.0)
Monocytes Absolute: 0.6 10*3/uL (ref 0.1–1.0)
Monocytes Relative: 13 %
Neutro Abs: 2.8 10*3/uL (ref 1.7–7.7)
Neutrophils Relative %: 66 %
Platelets: 103 10*3/uL — ABNORMAL LOW (ref 150–400)
RBC: 3.16 MIL/uL — ABNORMAL LOW (ref 4.22–5.81)
RDW: 13.8 % (ref 11.5–15.5)
WBC: 4.3 10*3/uL (ref 4.0–10.5)
nRBC: 0 % (ref 0.0–0.2)

## 2022-12-26 LAB — BASIC METABOLIC PANEL
Anion gap: 9 (ref 5–15)
BUN: 21 mg/dL (ref 8–23)
CO2: 25 mmol/L (ref 22–32)
Calcium: 8.7 mg/dL — ABNORMAL LOW (ref 8.9–10.3)
Chloride: 105 mmol/L (ref 98–111)
Creatinine, Ser: 0.89 mg/dL (ref 0.61–1.24)
GFR, Estimated: >60 mL/min (ref >60)
Glucose, Bld: 115 mg/dL — ABNORMAL HIGH (ref 70–99)
Potassium: 2.9 mmol/L — ABNORMAL LOW (ref 3.5–5.1)
Sodium: 139 mmol/L (ref 135–145)

## 2022-12-26 MED ORDER — POTASSIUM CHLORIDE ER 10 MEQ PO TBCR: 10.0000 meq | EXTENDED_RELEASE_TABLET | Freq: Every day | ORAL | 0 refills | Status: DC

## 2022-12-26 MED ORDER — POTASSIUM CHLORIDE CRYS ER 20 MEQ PO TBCR
40.0000 meq | EXTENDED_RELEASE_TABLET | Freq: Every day | ORAL | Status: DC
  Administered 2022-12-26: 40 meq via ORAL
  Filled 2022-12-26 (×2): qty 2

## 2022-12-26 MED ORDER — IOHEXOL 300 MG/ML  SOLN
100.0000 mL | Freq: Once | INTRAMUSCULAR | Status: AC | PRN
  Administered 2022-12-26: 100 mL via INTRAVENOUS

## 2022-12-26 NOTE — ED Notes (Signed)
Pt is irritable- states he is "ready to be discharged and go smoke a cigarette". Pt removed all monitoring equipment and is sitting in a chair beside bed

## 2022-12-26 NOTE — Progress Notes (Addendum)
  Addend@ 4:41 pm   CSW spoke to DSS worker Franki Monte who will be the patient's placement worker. At this time Frederich Balding will work with the patient on placement. DSS worker has reported to this CSW that she is currnetly speaking with [name] and is trying to figure out a plan for the patient. Chastiny # is [phone]  Patient was screened out at this time per [facility] from DSS. AT this time they will be sending a placement SW. CSW asked Patsy Lager about a name of the worker at this time Patsy Lager has no name for the worker. TOC will continue to follow.

## 2022-12-26 NOTE — Evaluation (Signed)
Physical Therapy Evaluation Patient Details Name: Kerry [name] MRN: [id number] DOB: 07-Aug-1945 Today's Date: 12/26/2022  History of Present Illness  77 yo male with who presents because his primary caregiver at his assisted living facility was concerned about his behavior. Patient recently had an extended stay in our emergency department, was evaluated by psychiatry, discharged.  Patient says that he feels fine at this time.  He does endorse having a bit of diarrhea today.PMH: anxiety, bipolar disorder, stage III pancreatic cancer  Clinical Impression  Pt admitted with above diagnosis. Pt pleasant, reports he is from [place], ambulates daily without AD, does endorse falls but doesn't specify a number, ind with ADLs/IADLs. Pt comes to sitting EOB without physical assist and gurney set at flat level, modified independent. Pt currently needing supv with mobility, 1 minor LOB with initial steps in room but able to recover without physical assistance. Pt using RW, educated on management and positioning and demo good safety with RW on evaluation. Pt without complaints and VSS. Recommend HHPT at d/c. Pt currently with functional limitations due to the deficits listed below (see PT Problem List). Pt will benefit from acute skilled PT to increase their independence and safety with mobility to allow discharge.           If plan is discharge home, recommend the following:     Can travel by private vehicle        Equipment Recommendations None recommended by PT  Recommendations for Other Services       Functional Status Assessment Patient has not had a recent decline in their functional status     Precautions / Restrictions Precautions Precautions: Fall Precaution Comments: seizure Restrictions Weight Bearing Restrictions: No      Mobility  Bed Mobility Overal bed mobility: Modified Independent             General bed mobility comments: no physical assist or verbal cues from  flat gurney in ED    Transfers Overall transfer level: Needs assistance Equipment used: Rolling walker (2 wheels) Transfers: Sit to/from Stand Sit to Stand: Supervision           General transfer comment: close supv, no physical assist to power up from edge of bed or arm chair, BUE assisting    Ambulation/Gait Ambulation/Gait assistance: Supervision Gait Distance (Feet): 300 Feet Assistive device: Rolling walker (2 wheels), None Gait Pattern/deviations: Step-through pattern, Decreased stride length Gait velocity: decreased     General Gait Details: step through gait pattern wtih RW, initial LOB but able to recover without physical assistance, verbal cues for clearing past obstacles at safe distance, no knee buckling or overt LOB noted; upon return to room pt ambulates around furniture without AD, close supv without LOB and without running into obstacles  Stairs            Wheelchair Mobility     Tilt Bed    Modified Rankin (Stroke Patients Only)       Balance Overall balance assessment: No apparent balance deficits (not formally assessed), History of Falls                                           Pertinent Vitals/Pain Pain Assessment Pain Assessment: No/denies pain    Home Living Family/patient expects to be discharged to:: Assisted living Living Arrangements:  (ALF staff) Available Help at Discharge: Other (Comment) (AFL staff and  caregiver) Type of Home: Other(Comment) (ALF) Home Access: Level entry       Home Layout: One level Home Equipment: Agricultural consultant (2 wheels)      Prior Function Prior Level of Function : Independent/Modified Independent             Mobility Comments: pt reports ind without AD, amb ~3000 feet/day, endorses falls ADLs Comments: pt reports ind with self care and household chores     Extremity/Trunk Assessment   Upper Extremity Assessment Upper Extremity Assessment: Overall WFL for tasks  assessed    Lower Extremity Assessment Lower Extremity Assessment: Overall WFL for tasks assessed    Cervical / Trunk Assessment Cervical / Trunk Assessment: Other exceptions Cervical / Trunk Exceptions: forward head and rounded shoulders  Communication   Communication Communication: No apparent difficulties  Cognition Arousal: Alert Behavior During Therapy: Flat affect Overall Cognitive Status: No family/caregiver present to determine baseline cognitive functioning                                 General Comments: pt flat, cooperative, pleasant. Pt able to state name, DOB, location, date as "Wednesday August I think the 7th, 2024", current president, aware of birthday coming up        General Comments      Exercises     Assessment/Plan    PT Assessment Patient needs continued PT services  PT Problem List Decreased balance;Decreased knowledge of use of DME;Decreased safety awareness       PT Treatment Interventions Gait training;Functional mobility training;Therapeutic activities;DME instruction;Therapeutic exercise;Balance training;Patient/family education;Neuromuscular re-education    PT Goals (Current goals can be found in the Care Plan section)  Acute Rehab PT Goals Patient Stated Goal: "I would like to be discharged" PT Goal Formulation: With patient Time For Goal Achievement: 01/09/23 Potential to Achieve Goals: Good    Frequency Min 1X/week     Co-evaluation               AM-PAC PT "6 Clicks" Mobility  Outcome Measure Help needed turning from your back to your side while in a flat bed without using bedrails?: None Help needed moving from lying on your back to sitting on the side of a flat bed without using bedrails?: None Help needed moving to and from a bed to a chair (including a wheelchair)?: A Little Help needed standing up from a chair using your arms (e.g., wheelchair or bedside chair)?: A Little Help needed to walk in hospital  room?: A Little Help needed climbing 3-5 steps with a railing? : A Little 6 Click Score: 20    End of Session Equipment Utilized During Treatment: Gait belt Activity Tolerance: Patient tolerated treatment well Patient left: in chair;with call bell/phone within reach;with nursing/sitter in room Nurse Communication: Mobility status PT Visit Diagnosis: Other abnormalities of gait and mobility (R26.89);History of falling (Z91.81)    Time: 1610-9604 PT Time Calculation (min) (ACUTE ONLY): 19 min   Charges:   PT Evaluation $PT Eval Low Complexity: 1 Low   PT General Charges $$ ACUTE PT VISIT: 1 Visit         Tori  PT, DPT 12/26/22, 2:01 PM

## 2022-12-26 NOTE — ED Notes (Signed)
Pt resting quietly with eyes closed. PTAR says pt still has 4 people ahead, will be about 1-2 hours before they can pick up.

## 2022-12-26 NOTE — Progress Notes (Addendum)
This CSW filed an APS report for the following concerns:  -POA/caregiver overload (per POA, pt has no family) POA also voiced "this is new to me." -Pt with pancreatic cancer and has missed 2 chemo treatments and per POA - pt has not been rescheduled for new treatment appts -Regional Director and POA verbalized they will continue to send pt to ER -Need for SW to assist with LTC placement in the community (pt not rec for SNF by PT/walking 300 ft).  -Uncertainty of assigned SW at [facility] due to [facility] no longer being pt's SW. [place] SW will need to assist with prior authorizations for pt's needs.

## 2022-12-26 NOTE — Care Management (Addendum)
Transition of Care [facility]) - Emergency Department Mini Assessment   Patient Details  Name: Kerry [name] MRN: [id number] Date of Birth: Mar 13, 1946  Transition of Care [facility]) CM/SW Contact:    Lavenia Atlas, RN Phone Number: 12/26/2022, 1:10 PM   Clinical Narrative: Patient is connected to [facility] Primary Provider:  [facility] [phone] [place], [place] SW: Vicente Masson Phone: [phone] ext [phone] Patient recently had a change with SW.   TOC will continue to follow.   - 2:30pm This RNCM left voicemail message for [facility] [place] [phone] ext [phone], awaiting call back. This RNCM contacted Rene Kocher w/ [facility] to confirm receipt of HH orders on previous ED visits, awaiting a response.   -3:11 pm received call from patient's primary provider at [facility], who reports she will also attempt to reach new SW [place].  TOC following for needs.  - 3:31 pm this RNCM [facility]achael Fee R to discuss further assistance with obtaining LTC in the community. Rachael Fee reports she is the [place] SW following today and will contact patient's POA to discuss LTC further.  Awaiting call from [facility] w/[facility] re: need for addtl HH orders.   - 5:24pm Received message from [facility] w/[facility] who reports will utilize the [facility] orders sent on previous ED visits.  No additional TOC needs at this time.     ED Mini Assessment: What brought you to the Emergency Department? : Patient recently discharged and returned with continued behavior changes  Barriers to Discharge: Continued Medical Work up  [employer] interventions: SNF placement          Patient Contact and Communications        ,                 Admission diagnosis:  abnormal behavior Patient Active Problem List   Diagnosis Date Noted   Aggressive behavior 11/22/2021   Status epilepticus (HCC) 11/11/2021   Postictal confusion 11/11/2021   Altered mental status 11/02/2021   Generalized weakness     Jaundice    Dark stools 10/24/2021   Serum ammonia increased (HCC) 10/22/2021   Mixed hyperlipidemia 10/21/2021   Restless leg syndrome 10/21/2021   BPH (benign prostatic hyperplasia) 10/21/2021   Seizure disorder (HCC) 10/21/2021   Common bile duct obstruction    Pancreatic lesion    Weight loss    Cholestatic jaundice 10/20/2021   Bipolar I disorder (HCC) 10/20/2021   Hypokalemia 10/20/2021   Lesion of left native kidney 10/20/2021   PCP:  Clinic, [facility] Pharm[facility]:   [facility] - [place], [place] - [address] [place] [address] Phone: [phone] Fax: [phone]  [name] [address] [place] [address] Phone: [phone] Fax: [phone]

## 2022-12-26 NOTE — ED Notes (Signed)
Pt provided with coffee and apple sauce

## 2022-12-26 NOTE — ED Provider Notes (Signed)
[place] EMERGENCY DEPARTMENT AT [facility] Provider Note   CSN: [id number] Arrival date & time: 12/26/22  1027     History {Add pertinent medical, surgical, social history, OB history to HPI:1} Chief Complaint  Patient presents with   Altered Mental Status    Kerry [name] is a 77 y.o. male.  This is a 77 year old male with history of anxiety, bipolar disorder, stage III pancreatic cancer who is here today at his because his primary caregiver at his assisted living facility was concerned about his behavior.  Patient recently had an extended stay in our emergency department, was evaluated by psychiatry, discharged.  Patient says that he feels fine at this time.  He does endorse having a bit of diarrhea today.   Altered Mental Status      Home Medications Prior to Admission medications   Medication Sig Start Date End Date Taking? Authorizing Provider  aspirin EC 81 MG tablet Take 1 tablet (81 mg total) by mouth daily. 10/28/21   Rodolph Bong, MD  Cholecalciferol (VITAMIN D) 50 MCG (2000 UT) tablet Take 2,000 Units by mouth at bedtime.    [provider]  clotrimazole (LOTRIMIN) 1 % external solution Apply 1 application  topically daily as needed (For dystrophic nails).    [provider]  divalproex (DEPAKOTE ER) 500 MG 24 hr tablet Take 2 tablets (1,000 mg total) by mouth at bedtime. 12/09/22 01/08/23  Dorcas Carrow, MD  donepezil (ARICEPT) 5 MG tablet Take 5 mg by mouth every morning. For memory    [provider]  Emollient (CETAPHIL) cream Apply 1 application. topically daily.    [provider]  escitalopram (LEXAPRO) 10 MG tablet Take 5 mg by mouth daily. For mental health    [provider]  ferrous gluconate (FERGON) 324 MG tablet Take 324 mg by mouth daily. Take with Vitamin C/ascorbic acid    [provider]  finasteride (PROSCAR) 5 MG tablet Take 5 mg by mouth daily. For overactive bladder     [provider]  levETIRAcetam (KEPPRA) 750 MG tablet Take 2 tablets (1,500 mg total) by mouth 2 (two) times daily. 12/09/22   Dorcas Carrow, MD  melatonin 3 MG TABS tablet Take 3 mg by mouth at bedtime.    [provider]  nicotine (NICODERM CQ - DOSED IN MG/24 HOURS) 14 mg/24hr patch Place 14 mg onto the skin daily.    [provider]  pantoprazole (PROTONIX) 40 MG tablet Take 1 tablet (40 mg total) by mouth daily. 10/25/21   Rodolph Bong, MD  QUEtiapine (SEROQUEL) 100 MG tablet Take 100 mg by mouth at bedtime. For mood and sleep    [provider]  rOPINIRole (REQUIP) 1 MG tablet Take 1 mg by mouth at bedtime.    [provider]  rosuvastatin (CRESTOR) 10 MG tablet Take 0.5 tablets (5 mg total) by mouth at bedtime. For cholesterol 11/01/21   Rodolph Bong, MD  traZODone (DESYREL) 150 MG tablet Take 75 mg by mouth at bedtime as needed for sleep.    [provider]  vitamin C (ASCORBIC ACID) 500 MG tablet Take 500 mg by mouth daily.    [provider]  zolpidem (AMBIEN) 10 MG tablet Take 5 mg by mouth at bedtime as needed for sleep.    [provider]      Allergies    Fluoride    Review of Systems   Review of Systems  Physical Exam Updated  Vital Signs BP (!) 103/58 (BP Location: Left Arm)   Pulse 70   Temp 97.7 F (36.5 C) (Oral)   Resp 17   SpO2 97%  Physical Exam Vitals reviewed.  HENT:     Head: Normocephalic.  Abdominal:     General: Abdomen is flat.     Palpations: Abdomen is soft.     Tenderness: There is no abdominal tenderness.  Neurological:     General: No focal deficit present.     Mental Status: He is alert.  Psychiatric:        Mood and Affect: Mood normal.        Behavior: Behavior normal.        Thought Content: Thought content normal.        Judgment: Judgment normal.     ED Results / Procedures / Treatments   Labs (all labs ordered are listed, but only abnormal results  are displayed) Labs Reviewed  CBC WITH DIFFERENTIAL/PLATELET  BASIC METABOLIC PANEL  CBC WITH DIFFERENTIAL/PLATELET    EKG None  Radiology No results found.  Procedures Procedures  {Document cardiac monitor, telemetry assessment procedure when appropriate:1}  Medications Ordered in ED Medications - No data to display  ED Course/ Medical Decision Making/ A&P Clinical Course as of 12/26/22 1433  Wed Dec 26, 2022  1127 Hemoglobin(!): 9.5 [JS]    Clinical Course User Index [JS] Arletha Pili, DO   {   Click here for ABCD2, HEART and other calculatorsREFRESH Note before signing :1}                              Medical Decision Making 77 year old male here today for reported unusual behavior.  Plan-on my assessment of the patient, he is pleasant, cooperative.  His only complaint at this time is that he has had a bit of diarrhea.  He tells me that he has been living at this assisted living facility for the last several weeks, and likes it quite a bit there.  He has not had any difficulty eating or drinking.  He says that he has not been sleeping as well as he usually does, was hoping to be prescribed some Ambien.  I reviewed the patient's recent psychiatric evaluation, reviewed his imaging which include included MRIs of the brain.  At this time, I do not see any unusual behavior in this patient, he is behaving appropriately.  I do not believe patient requires additional psychiatric evaluation.  Will obtain basic labs, CT imaging of the patient's abdomen pelvis given the reported diarrhea.  Reassessment-patient with some mildly low hypokalemia.  I have orally repleted.  CT imaging abdomen pelvis does not show any acute processes.  Labs otherwise normal.  POA was concerned about patient returning to his assisted living facility.  Obtained physical therapy evaluation, patient did not have any restrictions.  Patient is alert and oriented x 4 for me.  I involved social work, who  reached out to the patient's assisted living facility.  They discussed with the power of attorney and the regional director for this facility how the patient did not meet criteria for SNF placement or hospital admission, so placement would have to continue on an outpatient basis.  I also spoke with the patient's POA, "Dana", and discussed this with the patient.  I discussed the workup that had been done previously, as well as today, and how outpatient placement was the appropriate plan for this patient.  She expressed understanding at this, but also said that she would likely send the patient back to the emergency department tomorrow.  Amount and/or Complexity of Data Reviewed Labs: ordered. Decision-making details documented in ED Course. Radiology: ordered.  Risk Prescription drug management.   ***  {Document critical care time when appropriate:1} {Document review of labs and clinical decision tools ie heart score, Chads2Vasc2 etc:1}  {Document your independent review of radiology images, and any outside records:1} {Document your discussion with family members, caretakers, and with consultants:1} {Document social determinants of health affecting pt's care:1} {Document your decision making why or why not admission, treatments were needed:1} Final Clinical Impression(s) / ED Diagnoses Final diagnoses:  None    Rx / DC Orders ED Discharge Orders     None

## 2022-12-26 NOTE — Discharge Instructions (Addendum)
The labs that were done today were normal aside from a mildly low potassium.  I have sent a prescription for potassium pills.  CT imaging was done of the abdomen and pelvis that was normal.  You can take potassium once daily for the next 5 days.  Follow-up with your primary care doctor.  As we discussed over the telephone, if an escalation in level of care is desired, this should be pursued on an outpatient basis.

## 2022-12-26 NOTE — Progress Notes (Addendum)
Transition of Care [facility]) - Emergency Department Mini Assessment   Patient Details  Name: Kerry [name] MRN: [id number] Date of Birth: July 06, 1945  [facility]) CM/SW Contact:    Princella Ion, LCSW Phone Number: 12/26/2022, 1:18 PM   Clinical Narrative: [facility] consulted for SNF. Per chart review, pt has had several ER visits since an admission for a fall at [facility] on 7/19.   This CSW outreached to Thayer Ohm [phone]) who informed (the other) Thayer Ohm and Kennon Rounds are both off today. Thayer Ohm stated that from his understanding the pt was more independent and able to interact with staff. As of this morning, Thayer Ohm reported the pt was leaning over, unable to hold his spoon, spilling oatmeal, and mumbling incomprehensible things.   Case discussed with EDP who informed pt has had thorough workups on each encounter and all have come back normal.   This CSW spoke with POA/Caregiver, [name], who reports she will bring POA paperwork to upload into pt's chart. [name] states the pt "needs more structured care." [name] states she doesn't think the facility will want to take him back either. [name] reports pt has Pancreatic Cancer and has missed 2 chemo appointments due to being in and out of the ER. This CSW asked if she's coordinated rescheduling appts with his oncologist and she informed she'd reach out. [name] also informed she spoke with pt's SW at [facility]) and was informed that she will no longer be his SW.   RNCM called [place] [phone]) and was directed to [facility] ext [phone]. This CSW will attempt to contact. This CSW informed [name] of SNF process and explained pt's insurance would require prior authorization for STR and she would need to work with facility to transition pt into LTC.   Addend @ 2:03 PM  PT informed they recommended HH PT. RNCM to assist.    ED Mini Assessment: What brought you to the Emergency Department? : abnormal behavior (per  caregiver)  Barriers to Discharge: SNF Pending bed offer, ED SNF auth, ED Facility/Family Refusing to Allow Patient to Return  Barrier interventions: SNF placement  Means of departure: Not know       Patient Contact and Communications Key Contact 1: [name] (Other)  [phone]   Spoke with: [name] (Other)  [phone] Contact Date: 12/26/22,   Contact time: 1216 Contact Phone Number: [name] (Other)  [phone] Call outcome: POA/caregiver is stating she doesn't wish for the pt to return "because he needs more structured care and I don't think they want him to return either."    CMS Medicare.gov Compare Post Acute Care list provided to:: Patient Represenative (must comment) (POA/Caregiver) Choice offered to / list presented to : [facility] POA / Guardian (POA)  Admission diagnosis:  abnormal behavior Patient Active Problem List   Diagnosis Date Noted   Aggressive behavior 11/22/2021   Status epilepticus (HCC) 11/11/2021   Postictal confusion 11/11/2021   Altered mental status 11/02/2021   Generalized weakness    Jaundice    Dark stools 10/24/2021   Serum ammonia increased (HCC) 10/22/2021   Mixed hyperlipidemia 10/21/2021   Restless leg syndrome 10/21/2021   BPH (benign prostatic hyperplasia) 10/21/2021   Seizure disorder (HCC) 10/21/2021   Common bile duct obstruction    Pancreatic lesion    Weight loss    Cholestatic jaundice 10/20/2021   Bipolar I disorder (HCC) 10/20/2021   Hypokalemia 10/20/2021   Lesion of left native kidney 10/20/2021   PCP:  Clinic, [facility] Pharmacy:  [facility] - [place] - [address] [place] [address] Phone: [phone] Fax: [phone]  [name] [address] [place] [address] Phone: [phone] Fax: [phone]

## 2022-12-26 NOTE — ED Triage Notes (Signed)
Pt BIBA from [place] for abnormal behavior per caregiver x3 weeks. Seen multiple times for same and dc home. States he is 'talking strange". Pt reports feeling his normal self except some pain d/t chemo. VSS

## 2022-12-26 NOTE — Progress Notes (Addendum)
This CSW outreached to Production designer, theatre/television/film, Thayer Ohm [phone]) and reached the Regional Director (RD) for [facility] who did not provide her name. Regional Director states that the pt has been wandering and they've had to let him in 4 times overnight last night because he walks out to get a cigarette and forgets his keys. The RD states that they are not the appropriate level of care for the patient any longer. This CSW did explain that PT seen the pt and recommended HHPT; therefore, TOC cannot place him into a rehab facility.   This CSW inquired about whether pt had seen a neurologist or received a formal Dementia dx which is required for a Memory Care facility and it was reported that he has not. This CSW encouraged [name] (POA) to outreach to pt's PCP at [facility] to receive a referral to neurology. This CSW did inform that a formal dementia diagnoses is unable to provided in the ER. [name] reported that his PCP is out this week and this CSW still encouraged to outreach to PCP's RN at the [place]. RNCM did attempt to call Currie Paris [phone]), ext [phone]) who is interim SW at [facility].  This CSW also encouraged [name] to seek assistance with DSS (LTC placement social worker) who will provide assistance and follow along in the community. [name] also plans to outreach to pt's MH provider at the [facility] to see if they can complete a referral to neurology.   RD and [name] inquired about pt's medical workup and this CSW informed she'd have EDP outreach to her. Notified EDP via secure chat and received confirmation that he did have a conversation with pt's POA. RD and [name] informed this Clinical research associate and EDP that pt may return but they will continue to send him back to the ER.  RNCM to f/u with [facility] agency (per chart review, pt connected to [facility]).

## 2022-12-26 NOTE — ED Notes (Signed)
Pt POA states she is unable to pick pt up, said to arrange transport with PTAR. Ptar has been notified.

## 2022-12-27 NOTE — Progress Notes (Addendum)
This  CSW was contacted by Rene Kocher from [facility] in regards to the patient DC Plan which the patient was DC on 12/26/22. This CSW informed regina that [facility] is currently with the [facility] to help with LTC placement. TOC has signed off from the patient.

## 2022-12-31 ENCOUNTER — Encounter (HOSPITAL_BASED_OUTPATIENT_CLINIC_OR_DEPARTMENT_OTHER): Payer: Self-pay

## 2022-12-31 ENCOUNTER — Other Ambulatory Visit: Payer: Self-pay

## 2022-12-31 ENCOUNTER — Emergency Department (HOSPITAL_COMMUNITY)
Admission: EM | Admit: 2022-12-31 | Discharge: 2023-01-01 | Disposition: A | Discharge status: Home / Self Care | Payer: No Typology Code available for payment source | Attending: Emergency Medicine | Admitting: Emergency Medicine

## 2022-12-31 ENCOUNTER — Emergency Department (HOSPITAL_BASED_OUTPATIENT_CLINIC_OR_DEPARTMENT_OTHER)
Admission: EM | Admit: 2022-12-31 | Discharge: 2022-12-31 | Disposition: A | Discharge status: Home / Self Care | Payer: No Typology Code available for payment source | Attending: Emergency Medicine | Admitting: Emergency Medicine

## 2022-12-31 DIAGNOSIS — F03B0 Unspecified dementia, moderate, without behavioral disturbance, psychotic disturbance, mood disturbance, and anxiety: Secondary | ICD-10-CM | POA: Diagnosis present

## 2022-12-31 DIAGNOSIS — D649 Anemia, unspecified: Secondary | ICD-10-CM | POA: Insufficient documentation

## 2022-12-31 DIAGNOSIS — R000 Tachycardia, unspecified: Secondary | ICD-10-CM | POA: Insufficient documentation

## 2022-12-31 DIAGNOSIS — R4182 Altered mental status, unspecified: Secondary | ICD-10-CM | POA: Insufficient documentation

## 2022-12-31 DIAGNOSIS — R7309 Other abnormal glucose: Secondary | ICD-10-CM | POA: Diagnosis not present

## 2022-12-31 DIAGNOSIS — N3000 Acute cystitis without hematuria: Secondary | ICD-10-CM | POA: Diagnosis not present

## 2022-12-31 DIAGNOSIS — R824 Acetonuria: Secondary | ICD-10-CM | POA: Insufficient documentation

## 2022-12-31 DIAGNOSIS — Z7982 Long term (current) use of aspirin: Secondary | ICD-10-CM | POA: Insufficient documentation

## 2022-12-31 DIAGNOSIS — R569 Unspecified convulsions: Secondary | ICD-10-CM | POA: Diagnosis not present

## 2022-12-31 DIAGNOSIS — Z8507 Personal history of malignant neoplasm of pancreas: Secondary | ICD-10-CM | POA: Diagnosis not present

## 2022-12-31 DIAGNOSIS — F0390 Unspecified dementia without behavioral disturbance: Secondary | ICD-10-CM | POA: Insufficient documentation

## 2022-12-31 DIAGNOSIS — F03B18 Unspecified dementia, moderate, with other behavioral disturbance: Secondary | ICD-10-CM

## 2022-12-31 LAB — COMPREHENSIVE METABOLIC PANEL
ALT: 18 U/L (ref 0–44)
AST: 25 U/L (ref 15–41)
Albumin: 4 g/dL (ref 3.5–5.0)
Alkaline Phosphatase: 64 U/L (ref 38–126)
Anion gap: 9 (ref 5–15)
BUN: 10 mg/dL (ref 8–23)
CO2: 28 mmol/L (ref 22–32)
Calcium: 8.9 mg/dL (ref 8.9–10.3)
Chloride: 103 mmol/L (ref 98–111)
Creatinine, Ser: 0.61 mg/dL (ref 0.61–1.24)
GFR, Estimated: >60 mL/min (ref >60)
Glucose, Bld: 100 mg/dL — ABNORMAL HIGH (ref 70–99)
Potassium: 3.1 mmol/L — ABNORMAL LOW (ref 3.5–5.1)
Sodium: 140 mmol/L (ref 135–145)
Total Bilirubin: 0.4 mg/dL (ref 0.3–1.2)
Total Protein: 6.7 g/dL (ref 6.5–8.1)

## 2022-12-31 LAB — URINALYSIS, W/ REFLEX TO CULTURE (INFECTION SUSPECTED)
Bilirubin Urine: NEGATIVE
Glucose, UA: NEGATIVE mg/dL
Hgb urine dipstick: NEGATIVE
Ketones, ur: 15 mg/dL — AB
Nitrite: NEGATIVE
Protein, ur: NEGATIVE mg/dL
Specific Gravity, Urine: 1.02 (ref 1.005–1.030)
pH: 6 (ref 5.0–8.0)

## 2022-12-31 LAB — CBC WITH DIFFERENTIAL/PLATELET
Abs Immature Granulocytes: 0.03 10*3/uL (ref 0.00–0.07)
Basophils Absolute: 0.1 10*3/uL (ref 0.0–0.1)
Basophils Relative: 1 %
Eosinophils Absolute: 0.1 10*3/uL (ref 0.0–0.5)
Eosinophils Relative: 1 %
HCT: 31.1 % — ABNORMAL LOW (ref 39.0–52.0)
Hemoglobin: 10.5 g/dL — ABNORMAL LOW (ref 13.0–17.0)
Immature Granulocytes: 0 %
Lymphocytes Relative: 24 %
Lymphs Abs: 1.7 10*3/uL (ref 0.7–4.0)
MCH: 30.5 pg (ref 26.0–34.0)
MCHC: 33.8 g/dL (ref 30.0–36.0)
MCV: 90.4 fL (ref 80.0–100.0)
Monocytes Absolute: 0.6 10*3/uL (ref 0.1–1.0)
Monocytes Relative: 9 %
Neutro Abs: 4.6 10*3/uL (ref 1.7–7.7)
Neutrophils Relative %: 65 %
Platelets: 125 10*3/uL — ABNORMAL LOW (ref 150–400)
RBC: 3.44 MIL/uL — ABNORMAL LOW (ref 4.22–5.81)
RDW: 14.1 % (ref 11.5–15.5)
WBC: 7.1 10*3/uL (ref 4.0–10.5)
nRBC: 0 % (ref 0.0–0.2)

## 2022-12-31 MED ORDER — POTASSIUM CHLORIDE CRYS ER 20 MEQ PO TBCR
40.0000 meq | EXTENDED_RELEASE_TABLET | Freq: Once | ORAL | Status: AC
  Administered 2022-12-31: 40 meq via ORAL
  Filled 2022-12-31: qty 2

## 2022-12-31 MED ORDER — CEPHALEXIN 500 MG PO CAPS: 500.0000 mg | ORAL_CAPSULE | Freq: Two times a day (BID) | ORAL | 0 refills | Status: DC

## 2022-12-31 MED ORDER — NICOTINE 7 MG/24HR TD PT24
7.0000 mg | MEDICATED_PATCH | Freq: Once | TRANSDERMAL | Status: DC
  Administered 2022-12-31: 7 mg via TRANSDERMAL
  Filled 2022-12-31: qty 1

## 2022-12-31 MED ORDER — CEPHALEXIN 250 MG PO CAPS
500.0000 mg | ORAL_CAPSULE | Freq: Once | ORAL | Status: AC
  Administered 2022-12-31: 500 mg via ORAL
  Filled 2022-12-31: qty 2

## 2022-12-31 NOTE — ED Notes (Signed)
PTAR at bedside to transport pt [place]. Report given to [facility] by dayshift

## 2022-12-31 NOTE — ED Triage Notes (Addendum)
Pt to ED via [facility] from [facility] d/t facility concerned for pt abnormal behavior/mental health evaluation. Pt voicing no complaints in triage. Hx dementia

## 2022-12-31 NOTE — ED Provider Notes (Signed)
[place] EMERGENCY DEPARTMENT AT [facility] Provider Note   CSN: [id number] Arrival date & time: 12/31/22  1248     History  Chief Complaint  Patient presents with   Mental Health Problem    Kaipo Behning is a 77 y.o. male with PMHx dementia, seizures, bipolar 1 disorder, anxiety, stage 3 pancreatic cancer who presents to ED sent from assisted living facility who was concerned for patient's behavior. Patient seen multiple times over the past couple of weeks in ED for same complaint. During these ED visits, patient had MRI brain, CT head and CT abdomen without acute concern. Patient also cleared by psych on 8/5. Patient denying any symptoms such as chest pain, dyspnea, cough, abdominal pain, nausea, vomiting, diarrhea.  Talked with POA [name]) who states that patient has been talking sexually to women, smoking in room, wondering around facility x2-3 weeks. POA stating that assisted living facility wants to transfer patient to SNF but they are not sure how to achieve this.   Mental Health Problem      Home Medications Prior to Admission medications   Medication Sig Start Date End Date Taking? Authorizing Provider  cephALEXin (KEFLEX) 500 MG capsule Take 1 capsule (500 mg total) by mouth 2 (two) times daily for 7 days. 12/31/22 01/07/23 Yes Dorthy Cooler, PA-C  aspirin EC 81 MG tablet Take 1 tablet (81 mg total) by mouth daily. 10/28/21   Rodolph Bong, MD  Cholecalciferol (VITAMIN D) 50 MCG (2000 UT) tablet Take 2,000 Units by mouth at bedtime.    [provider]  clotrimazole (LOTRIMIN) 1 % external solution Apply 1 application  topically daily as needed (For dystrophic nails).    [provider]  divalproex (DEPAKOTE ER) 500 MG 24 hr tablet Take 2 tablets (1,000 mg total) by mouth at bedtime. 12/09/22 01/08/23  Dorcas Carrow, MD  donepezil (ARICEPT) 5 MG tablet Take 5 mg by mouth every morning. For memory    [provider]  Emollient  (CETAPHIL) cream Apply 1 application. topically daily.    [provider]  escitalopram (LEXAPRO) 10 MG tablet Take 5 mg by mouth daily. For mental health    [provider]  ferrous gluconate (FERGON) 324 MG tablet Take 324 mg by mouth daily. Take with Vitamin C/ascorbic acid    [provider]  finasteride (PROSCAR) 5 MG tablet Take 5 mg by mouth daily. For overactive bladder    [provider]  levETIRAcetam (KEPPRA) 750 MG tablet Take 2 tablets (1,500 mg total) by mouth 2 (two) times daily. 12/09/22   Dorcas Carrow, MD  melatonin 3 MG TABS tablet Take 3 mg by mouth at bedtime.    [provider]  nicotine (NICODERM CQ - DOSED IN MG/24 HOURS) 14 mg/24hr patch Place 14 mg onto the skin daily.    [provider]  pantoprazole (PROTONIX) 40 MG tablet Take 1 tablet (40 mg total) by mouth daily. 10/25/21   Rodolph Bong, MD  potassium chloride (KLOR-CON) 10 MEQ tablet Take 1 tablet (10 mEq total) by mouth daily for 5 days. 12/26/22 12/31/22  Anders Simmonds T, DO  QUEtiapine (SEROQUEL) 100 MG tablet Take 100 mg by mouth at bedtime. For mood and sleep    [provider]  rOPINIRole (REQUIP) 1 MG tablet Take 1 mg by mouth at bedtime.    [provider]  rosuvastatin (CRESTOR) 10 MG tablet Take 0.5 tablets (5 mg total) by mouth at bedtime. For cholesterol 11/01/21  Rodolph Bong, MD  traZODone (DESYREL) 150 MG tablet Take 75 mg by mouth at bedtime as needed for sleep.    [provider]  vitamin C (ASCORBIC ACID) 500 MG tablet Take 500 mg by mouth daily.    [provider]  zolpidem (AMBIEN) 10 MG tablet Take 5 mg by mouth at bedtime as needed for sleep.    [provider]      Allergies    Fluoride    Review of Systems   Review of Systems  Neurological:        Confusion     Physical Exam Updated Vital Signs BP 112/60 (BP Location: Right Arm)   Pulse 64   Temp 98.3 F (36.8 C) (Oral)    Resp 16   Ht 5\' 6"  (1.676 m)   SpO2 97%   BMI 22.06 kg/m  Physical Exam Vitals and nursing note reviewed.  Constitutional:      General: He is not in acute distress.    Appearance: He is not ill-appearing or toxic-appearing.  HENT:     Head: Normocephalic and atraumatic.     Mouth/Throat:     Mouth: Mucous membranes are moist.  Eyes:     General: No scleral icterus.       Right eye: No discharge.        Left eye: No discharge.     Conjunctiva/sclera: Conjunctivae normal.  Cardiovascular:     Rate and Rhythm: Normal rate and regular rhythm.     Pulses: Normal pulses.     Heart sounds: Normal heart sounds. No murmur heard. Pulmonary:     Effort: Pulmonary effort is normal. No respiratory distress.     Breath sounds: Normal breath sounds. No wheezing, rhonchi or rales.  Abdominal:     General: Abdomen is flat. Bowel sounds are normal. There is no distension.     Palpations: Abdomen is soft. There is no mass.     Tenderness: There is no abdominal tenderness.  Musculoskeletal:     Right lower leg: No edema.     Left lower leg: No edema.  Skin:    General: Skin is warm and dry.     Findings: No rash.  Neurological:     General: No focal deficit present.     Mental Status: He is alert. Mental status is at baseline.     Comments: Patient AAOx2. Pleasantly demented. Ambulating around ED but easy to redirect.  Psychiatric:        Mood and Affect: Mood normal.        Behavior: Behavior normal.     ED Results / Procedures / Treatments   Labs (all labs ordered are listed, but only abnormal results are displayed) Labs Reviewed  CBC WITH DIFFERENTIAL/PLATELET - Abnormal; Notable for the following components:      Result Value   RBC 3.44 (*)    Hemoglobin 10.5 (*)    HCT 31.1 (*)    Platelets 125 (*)    All other components within normal limits  COMPREHENSIVE METABOLIC PANEL - Abnormal; Notable for the following components:   Potassium 3.1 (*)    Glucose, Bld 100 (*)     All other components within normal limits  URINALYSIS, W/ REFLEX TO CULTURE (INFECTION SUSPECTED) - Abnormal; Notable for the following components:   Ketones, ur 15 (*)    Leukocytes,Ua SMALL (*)    Bacteria, UA RARE (*)    All other components within normal limits  URINE  CULTURE    EKG None  Radiology No results found.  Procedures Procedures    Medications Ordered in ED Medications  potassium chloride SA (KLOR-CON M) CR tablet 40 mEq (has no administration in time range)  cephALEXin (KEFLEX) capsule 500 mg (has no administration in time range)  nicotine (NICODERM CQ - dosed in mg/24 hr) patch 7 mg (has no administration in time range)    ED Course/ Medical Decision Making/ A&P                                 Medical Decision Making Amount and/or Complexity of Data Reviewed Labs: ordered.   This patient presents to the ED for concern of AMS, this involves an extensive number of treatment options, and is a complaint that carries with it a high risk of complications and morbidity.  The differential diagnosis includes CVA, ICH, intracranial mass, critical dehydration, heptatic dysfunction, uremia, hypercarbia, intoxication/withdrawal, endocrine abnormality, sepsis/infection.   Co morbidities that complicate the patient evaluation  dementia, seizures, bipolar 1 disorder, anxiety, pancreatic cancer    Additional history obtained:  Additional history obtained from previous ED visits -MRI 12/18/2022 showing No acute intracranial abnormality.  Findings of chronic small vessel ischemia and volume loss. -CT head 12/24/22 showing generalized cerebral atrophy with mild, chronic white matter small vessel ischemic changes. No acute intracranial abnormality. -CT abdomen 12/26/2022 showing no acute findings -ED note 12/24/2022: patient cleared by psychiatry.    Lab Tests:  I Ordered, and personally interpreted labs.  The pertinent results include: CBC: mild anemia, no  leukocytosis CMP: hypokalemia (3.1) UA: small leukocytes, 6-10 WBC, rare bacteria   Problem List / ED Course / Critical interventions / Medication management  Patient presented for behavioral problems.  Patient with history of dementia.  Living facility concerned since patient has been expressing sexual comments towards the ladies, smoking in his room, and wandering around the facility.  Patient seen in the emergency room multiple times over the past couple of weeks for the same complaints and has underwent extensive imaging and has also been cleared by psych.  Physical exam unremarkable.  Patient currently denying any complaint.  Talked to POA who states that living facility would like patient to be placed into a SNF.  CMP with mild hypokalemia which I provided patient with oral potassium supplement today.  CBC with mild anemia, no leukocytosis.  UA with small leukocytes, rare bacteria, and 6-10 WBC.  Provided patient with a dose of Keflex in the emergency room and sent the rest of the prescription to pharmacy. Patient is pleasantly demented and overall looks well-appearing.  Patient ambulating throughout the emergency room but is easily redirected.  Patient does not meet criteria for inpatient admission at this time. I have reviewed the patients home medicines and have made adjustments as needed Patient was given return precautions. Patient stable for discharge at this time. Patient verbalized understanding of plan.   DDx: These were considered less likely due to history of present illness and physical exam findings CVA/ICH/intracranial mass: CT without concern critical dehydration/heptatic dysfunction/Uremia: CMP without concern Hypercarbia/Intoxication/withdrawal: patient history inconsistent with this diagnosis  Sepsis: patient afebrile with stable vitals   Social Determinants of Health:  dementia           Final Clinical Impression(s) / ED Diagnoses Final diagnoses:  Acute  cystitis without hematuria  Moderate dementia with other behavioral disturbance, unspecified dementia type (HCC)    Rx /  DC Orders ED Discharge Orders          Ordered    cephALEXin (KEFLEX) 500 MG capsule  2 times daily        12/31/22 1635              Dorthy Cooler, [place] 12/31/22 1642    Sloan Leiter, DO 01/03/23 [phone]

## 2022-12-31 NOTE — ED Notes (Signed)
Called [place] to give report on this Pt. Person answering the phone advised that they are an independent facility and there is no one to give a report to. I was advised t call Pt's POA. POA that is listed on Pt's chart was called had no answer. Left message to call this ED and ask for a nurse that could give report on the statis of this Pt.

## 2022-12-31 NOTE — ED Notes (Signed)
PTAR has been called to transport Pt back to his residence [place].

## 2022-12-31 NOTE — ED Notes (Signed)
Patient given some macaroni and cheese.

## 2022-12-31 NOTE — Discharge Instructions (Addendum)
Is was a pleasure caring for you today.  Workup is reassuring.  You have a UTI - I am sending you a prescription for your UTI to your pharmacy. Seek emergency care if experiencing any new or worsening symptoms.

## 2022-12-31 NOTE — ED Notes (Signed)
Pt seen attempting to exit outside. This RN attempted to stop pt but pt stated "He was going outside to smoke!" Pt left. RN notified.

## 2023-01-01 ENCOUNTER — Other Ambulatory Visit (HOSPITAL_COMMUNITY): Payer: Self-pay

## 2023-01-01 ENCOUNTER — Other Ambulatory Visit: Payer: Self-pay

## 2023-01-01 ENCOUNTER — Emergency Department (HOSPITAL_COMMUNITY)
Admission: EM | Admit: 2023-01-01 | Discharge: 2023-01-01 | Disposition: A | Discharge status: Home / Self Care | Payer: No Typology Code available for payment source | Attending: Emergency Medicine | Admitting: Emergency Medicine

## 2023-01-01 ENCOUNTER — Encounter (HOSPITAL_COMMUNITY): Payer: Self-pay

## 2023-01-01 DIAGNOSIS — R4182 Altered mental status, unspecified: Secondary | ICD-10-CM | POA: Insufficient documentation

## 2023-01-01 DIAGNOSIS — Z8507 Personal history of malignant neoplasm of pancreas: Secondary | ICD-10-CM | POA: Diagnosis not present

## 2023-01-01 DIAGNOSIS — Z7982 Long term (current) use of aspirin: Secondary | ICD-10-CM | POA: Insufficient documentation

## 2023-01-01 DIAGNOSIS — F0390 Unspecified dementia without behavioral disturbance: Secondary | ICD-10-CM | POA: Diagnosis not present

## 2023-01-01 DIAGNOSIS — G4700 Insomnia, unspecified: Secondary | ICD-10-CM | POA: Insufficient documentation

## 2023-01-01 LAB — URINALYSIS, ROUTINE W REFLEX MICROSCOPIC
Bilirubin Urine: NEGATIVE
Glucose, UA: NEGATIVE mg/dL
Hgb urine dipstick: NEGATIVE
Ketones, ur: 20 mg/dL — AB
Leukocytes,Ua: NEGATIVE
Nitrite: NEGATIVE
Protein, ur: NEGATIVE mg/dL
Specific Gravity, Urine: 1.013 (ref 1.005–1.030)
pH: 7 (ref 5.0–8.0)

## 2023-01-01 LAB — COMPREHENSIVE METABOLIC PANEL WITH GFR
ALT: 23 U/L (ref 0–44)
AST: 35 U/L (ref 15–41)
Albumin: 4 g/dL (ref 3.5–5.0)
Alkaline Phosphatase: 79 U/L (ref 38–126)
Anion gap: 22 — ABNORMAL HIGH (ref 5–15)
BUN: 12 mg/dL (ref 8–23)
CO2: 17 mmol/L — ABNORMAL LOW (ref 22–32)
Calcium: 9.6 mg/dL (ref 8.9–10.3)
Chloride: 105 mmol/L (ref 98–111)
Creatinine, Ser: 0.74 mg/dL (ref 0.61–1.24)
GFR, Estimated: >60 mL/min
Glucose, Bld: 123 mg/dL — ABNORMAL HIGH (ref 70–99)
Potassium: 4 mmol/L (ref 3.5–5.1)
Sodium: 144 mmol/L (ref 135–145)
Total Bilirubin: 0.5 mg/dL (ref 0.3–1.2)
Total Protein: 8 g/dL (ref 6.5–8.1)

## 2023-01-01 LAB — RAPID URINE DRUG SCREEN, HOSP PERFORMED
Amphetamines: NOT DETECTED
Barbiturates: NOT DETECTED
Benzodiazepines: NOT DETECTED
Cocaine: NOT DETECTED
Opiates: NOT DETECTED
Tetrahydrocannabinol: NOT DETECTED

## 2023-01-01 LAB — CBC WITH DIFFERENTIAL/PLATELET
Abs Immature Granulocytes: 0.04 10*3/uL (ref 0.00–0.07)
Basophils Absolute: 0.1 10*3/uL (ref 0.0–0.1)
Basophils Relative: 1 %
Eosinophils Absolute: 0 10*3/uL (ref 0.0–0.5)
Eosinophils Relative: 0 %
HCT: 38.9 % — ABNORMAL LOW (ref 39.0–52.0)
Hemoglobin: 11.8 g/dL — ABNORMAL LOW (ref 13.0–17.0)
Immature Granulocytes: 0 %
Lymphocytes Relative: 19 %
Lymphs Abs: 1.7 10*3/uL (ref 0.7–4.0)
MCH: 29.9 pg (ref 26.0–34.0)
MCHC: 30.3 g/dL (ref 30.0–36.0)
MCV: 98.5 fL (ref 80.0–100.0)
Monocytes Absolute: 0.8 10*3/uL (ref 0.1–1.0)
Monocytes Relative: 9 %
Neutro Abs: 6.3 10*3/uL (ref 1.7–7.7)
Neutrophils Relative %: 71 %
Platelets: 143 10*3/uL — ABNORMAL LOW (ref 150–400)
RBC: 3.95 MIL/uL — ABNORMAL LOW (ref 4.22–5.81)
RDW: 13.9 % (ref 11.5–15.5)
WBC: 8.9 10*3/uL (ref 4.0–10.5)
nRBC: 0 % (ref 0.0–0.2)

## 2023-01-01 LAB — ETHANOL: Alcohol, Ethyl (B): <10 mg/dL (ref <10)

## 2023-01-01 LAB — CBG MONITORING, ED: Glucose-Capillary: 123 mg/dL — ABNORMAL HIGH (ref 70–99)

## 2023-01-01 LAB — VALPROIC ACID LEVEL: Valproic Acid Lvl: 36 ug/mL — ABNORMAL LOW (ref 50.0–100.0)

## 2023-01-01 MED ORDER — LEVETIRACETAM 500 MG PO TABS
1500.0000 mg | ORAL_TABLET | Freq: Once | ORAL | Status: AC
  Administered 2023-01-01: 1500 mg via ORAL
  Filled 2023-01-01: qty 3

## 2023-01-01 MED ORDER — TRAZODONE HCL 150 MG PO TABS
75.0000 mg | ORAL_TABLET | Freq: Every evening | ORAL | 0 refills | Status: DC | PRN
  Filled 2023-01-01: qty 5, 10d supply, fill #0

## 2023-01-01 MED ORDER — LORAZEPAM 2 MG/ML IJ SOLN
INTRAMUSCULAR | Status: AC
  Administered 2023-01-01: 1 mg
  Filled 2023-01-01: qty 1

## 2023-01-01 MED ORDER — DIVALPROEX SODIUM ER 500 MG PO TB24
1000.0000 mg | ORAL_TABLET | ORAL | Status: AC
  Administered 2023-01-01: 1000 mg via ORAL
  Filled 2023-01-01: qty 2

## 2023-01-01 MED ORDER — NICOTINE 21 MG/24HR TD PT24
21.0000 mg | MEDICATED_PATCH | Freq: Once | TRANSDERMAL | Status: DC
  Administered 2023-01-01: 21 mg via TRANSDERMAL
  Filled 2023-01-01: qty 1

## 2023-01-01 MED ORDER — ROPINIROLE HCL 1 MG PO TABS
1.0000 mg | ORAL_TABLET | Freq: Once | ORAL | Status: AC
  Administered 2023-01-01: 1 mg via ORAL
  Filled 2023-01-01: qty 1

## 2023-01-01 MED ORDER — CEPHALEXIN 500 MG PO CAPS
500.0000 mg | ORAL_CAPSULE | Freq: Two times a day (BID) | ORAL | 0 refills | Status: DC
Start: 2023-01-01 — End: 2023-01-12

## 2023-01-01 MED ORDER — CEPHALEXIN 500 MG PO CAPS
500.0000 mg | ORAL_CAPSULE | Freq: Once | ORAL | Status: AC
  Administered 2023-01-01: 500 mg via ORAL
  Filled 2023-01-01: qty 1

## 2023-01-01 MED ORDER — TRAZODONE HCL 150 MG PO TABS: 75.0000 mg | ORAL_TABLET | Freq: Every evening | ORAL | 0 refills | Status: DC | PRN

## 2023-01-01 MED ORDER — QUETIAPINE FUMARATE 100 MG PO TABS
100.0000 mg | ORAL_TABLET | Freq: Every day | ORAL | Status: DC
  Administered 2023-01-01: 100 mg via ORAL
  Filled 2023-01-01: qty 1

## 2023-01-01 MED ORDER — LEVETIRACETAM IN NACL 1000 MG/100ML IV SOLN
1000.0000 mg | Freq: Once | INTRAVENOUS | Status: AC
  Administered 2023-01-01: 1000 mg via INTRAVENOUS
  Filled 2023-01-01: qty 100

## 2023-01-01 MED ORDER — ZOLPIDEM TARTRATE 10 MG PO TABS: 5.0000 mg | ORAL_TABLET | Freq: Every evening | ORAL | 0 refills | Status: DC | PRN

## 2023-01-01 NOTE — Discharge Instructions (Signed)
It is very important that you take your levetiracetam (Keppra) twice a day, every day.  If you do not take it as prescribed, you will have more seizures.

## 2023-01-01 NOTE — ED Notes (Signed)
PTAR called. Patient is 9th on the list.

## 2023-01-01 NOTE — Progress Notes (Signed)
Transition of Care [facility]) - Emergency Department Mini Assessment   Patient Details  Name: Kerry [name] MRN: [id number] Date of Birth: February 16, 1946  Transition of Care [facility]) CM/SW Contact:    Princella Ion, LCSW Phone Number: 01/01/2023, 11:08 AM   Clinical Narrative: Pt was brought back to [facility] ED overnight after being discharged back to [facility] from visit [facility] ER on 12/31/22. Per chart review, pt's POA/caregiver refused to sign for pt to return. EDP note from [facility] provider notes pt was dx with a UTI at [facility] on 8/12 and was prescribed medication; however, it has not been picked up.   This CSW outreached to Placement SW w/[facility] - Franki Monte [phone] to inquire about process in finding alternative LTC placement from pt. Chastiny reported she has spoken with pt's POA, [name], in detail about what is needed from her. Chastiny reports she created a consent to release packet and asked [name] to pick it up and also offered to deliver it to her but neither have happened. This CSW discussed with Chastiny at length information that was provided to [facility] on pt's previous ER visits. Chastiny reported that she will outreach to [place] and reiterate that pt will need supports outside of ER. Chastiny has requested an FL2 and this Clinical research associate will send.    Per chart review, [place] [phone], ext [phone]), Primary Care SW at [facility] has outreached and requested SW contact her. This CSW attempted to make contact without success- Left HIPAA Compliant voicemail.   ED Mini Assessment: What brought you to the Emergency Department? : Per chart review, care giver would not sign for pt to return to [facility],  Barriers to Discharge: No Barriers Identified     Means of departure: Ambulance       Patient Contact and Communications Key Contact 1: Placement SW w/[facility] - Franki Monte - [phone] Key Contact 2: [name] (Other)  [phone] Spoke with:  Placement SW w/[facility] - Franki Monte - [phone] Contact Date: 01/01/23,   Contact time: 1024 Contact Phone Number: Placement SW w/[facility] - Franki Monte - [phone] Call outcome: This CSW and Chastiny (DSS Placement SW) plans to outreach to [place] SW to coordinate outpatient resources         Admission diagnosis:  AMS Patient Active Problem List   Diagnosis Date Noted   Aggressive behavior 11/22/2021   Status epilepticus (HCC) 11/11/2021   Postictal confusion 11/11/2021   Altered mental status 11/02/2021   Generalized weakness    Jaundice    Dark stools 10/24/2021   Serum ammonia increased (HCC) 10/22/2021   Mixed hyperlipidemia 10/21/2021   Restless leg syndrome 10/21/2021   BPH (benign prostatic hyperplasia) 10/21/2021   Seizure disorder (HCC) 10/21/2021   Common bile duct obstruction    Pancreatic lesion    Weight loss    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 [place] [address] Phone: [phone] Fax: [phone]  [name] [address] [place] [address] Phone: [phone] Fax: [phone]

## 2023-01-01 NOTE — Care Management (Addendum)
Transition of Care [facility]) - Emergency Department Mini Assessment   Patient Details  Name: Kerry [name] MRN: [id number] Date of Birth: 09-04-1945  Transition of Care [facility]) CM/SW Contact:    Lavenia Atlas, RN Phone Number: 01/01/2023, 3:39 PM   Clinical Narrative: Per chart review patient is being prescribed trazadone and previous medications were sent to [facility] pharmacy in [place], not obtained. During this ED visit EDP has prescribed to [facility]. This RNCM will obtain medication prior to discharge.  This RNCM spoke with [facility] who reports will have medications ready in approx. 15 mins.  Notified MD, RN, SW.  TOC following  - 4:00pm This RNCM delivered medication (Trazadone) to patient at bedside. RN Cassandra aware.  No additional TOC needs at this time.  ED Mini Assessment: What brought you to the Emergency Department? : PEr chart review patient discharged this AM from [facility] via [facility] to [facility]. PTAR brought patient to [facility] ED.  Barriers to Discharge: Barriers Resolved  Barrier interventions: medication coordination  Means of departure: Ambulance  Interventions which prevented an admission or readmission: Medication Review    Patient Contact and Communications Key Contact 1: Placement SW w/[facility] Franki Monte - [phone] Key Contact 2: [name] (Other)  [phone] Spoke with: Placement SW w/[facility] - Franki Monte - [phone] Contact Date: 01/01/23,   Contact time: 1024 Contact Phone Number: Placement SW w/[facility] - Franki Monte - [phone] Call outcome: This CSW and Chastiny (DSS Placement SW) plans to outreach to [place] SW to coordinate outpatient resources         Admission diagnosis:  AMS Patient Active Problem List   Diagnosis Date Noted   Aggressive behavior 11/22/2021   Status epilepticus (HCC) 11/11/2021   Postictal confusion 11/11/2021   Altered mental status 11/02/2021   Generalized weakness     Jaundice    Dark stools 10/24/2021   Serum ammonia increased (HCC) 10/22/2021   Mixed hyperlipidemia 10/21/2021   Restless leg syndrome 10/21/2021   BPH (benign prostatic hyperplasia) 10/21/2021   Seizure disorder (HCC) 10/21/2021   Common bile duct obstruction    Pancreatic lesion    Weight loss    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 [place] [address] Phone: [phone] Fax: [phone]  [name] [address] [place] [address] Phone: [phone] Fax: [phone]  [facility] [address] [place] [address] Phone: [phone] Fax: [phone]

## 2023-01-01 NOTE — ED Triage Notes (Signed)
Patient discharged this AM with PTAR to [facility]. PTAR brought patient back. Patient has hx of dementia and UTI. Patient lives in independent living and POA/Caregiver refused to sign for patient to return. Needs SW consult.

## 2023-01-01 NOTE — ED Provider Notes (Addendum)
Kerry [name] AT [facility] Provider Note   CSN: [id number] Arrival date & time: 01/01/23  8295     History  Chief Complaint  Patient presents with   Altered Mental Status    Kerry [name] is a 77 y.o. male.  77 year old male with a history of dementia, seizures, bipolar, and pancreatic cancer who presented to the emergency [name] with concerns for placement.  Was seen twice in the past 24 hours for altered mental status.  Was thought to be possibly due to a seizure for him.  I called [facility] and the staff say that over the past 3 weeks he has had a gradual decline in his mentation.  Has been engaging in risky behaviors such as going outside at night and lighting cigarettes and leaving them lit.  Has had multiple visits to the emergency [name] recently.       Home Medications Prior to Admission medications   Medication Sig Start Date End Date Taking? Authorizing Provider  aspirin EC 81 MG tablet Take 1 tablet (81 mg total) by mouth daily. 10/28/21   Rodolph Bong, MD  cephALEXin (KEFLEX) 500 MG capsule Take 1 capsule (500 mg total) by mouth 2 (two) times daily for 7 days. 01/01/23 01/08/23  Kerry Booze, MD  Cholecalciferol (VITAMIN D) 50 MCG (2000 UT) tablet Take 2,000 Units by mouth at bedtime.    [provider]  clotrimazole (LOTRIMIN) 1 % external solution Apply 1 application  topically daily as needed (For dystrophic nails).    [provider]  divalproex (DEPAKOTE ER) 500 MG 24 hr tablet Take 2 tablets (1,000 mg total) by mouth at bedtime. 12/09/22 01/08/23  Kerry Carrow, MD  donepezil (ARICEPT) 5 MG tablet Take 5 mg by mouth every morning. For memory    [provider]  Emollient (CETAPHIL) cream Apply 1 application. topically daily.    [provider]  escitalopram (LEXAPRO) 10 MG tablet Take 5 mg by mouth daily. For mental health    [provider]  ferrous gluconate (FERGON)  324 MG tablet Take 324 mg by mouth daily. Take with Vitamin C/ascorbic acid    [provider]  finasteride (PROSCAR) 5 MG tablet Take 5 mg by mouth daily. For overactive bladder    [provider]  levETIRAcetam (KEPPRA) 750 MG tablet Take 2 tablets (1,500 mg total) by mouth 2 (two) times daily. 12/09/22   Kerry Carrow, MD  melatonin 3 MG TABS tablet Take 3 mg by mouth at bedtime.    [provider]  nicotine (NICODERM CQ - DOSED IN MG/24 HOURS) 14 mg/24hr patch Place 14 mg onto the skin daily.    [provider]  pantoprazole (PROTONIX) 40 MG tablet Take 1 tablet (40 mg total) by mouth daily. 10/25/21   Rodolph Bong, MD  potassium chloride (KLOR-CON) 10 MEQ tablet Take 1 tablet (10 mEq total) by mouth daily for 5 days. 12/26/22 12/31/22  Kerry Simmonds T, DO  QUEtiapine (SEROQUEL) 100 MG tablet Take 100 mg by mouth at bedtime. For mood and sleep    [provider]  rOPINIRole (REQUIP) 1 MG tablet Take 1 mg by mouth at bedtime.    [provider]  rosuvastatin (CRESTOR) 10 MG tablet Take 0.5 tablets (5 mg total) by mouth at bedtime. For cholesterol 11/01/21   Rodolph Bong, MD  traZODone (DESYREL) 150 MG tablet Take 75 mg by mouth at bedtime as needed for sleep.    [provider]  vitamin C (ASCORBIC ACID) 500 MG tablet Take 500 mg by mouth daily.    [provider]  zolpidem (AMBIEN) 10 MG tablet Take 0.5 tablets (5 mg total) by mouth at bedtime as needed for sleep. 01/01/23   Kerry Baton, MD      Allergies    Fluoride    Review of Systems   Review of Systems  Physical Exam Updated Vital Signs BP (!) 177/67 (BP Location: Right Arm)   Pulse 77   Temp 98 F (36.7 C) (Oral)   Resp 18   SpO2 99%  Physical Exam Vitals and nursing note reviewed.  Constitutional:      General: He is not in acute distress.    Appearance: He is well-developed.  HENT:     Head: Normocephalic and atraumatic.     Right  Ear: External ear normal.     Left Ear: External ear normal.     Nose: Nose normal.  Eyes:     Extraocular Movements: Extraocular movements intact.     Conjunctiva/sclera: Conjunctivae normal.     Pupils: Pupils are equal, round, and reactive to light.  Cardiovascular:     Rate and Rhythm: Normal rate and regular rhythm.     Heart sounds: Normal heart sounds.  Pulmonary:     Effort: Pulmonary effort is normal. No respiratory distress.     Breath sounds: Normal breath sounds.  Abdominal:     General: There is no distension.     Palpations: Abdomen is soft. There is no mass.     Tenderness: There is no abdominal tenderness. There is no guarding.  Musculoskeletal:     Cervical back: Normal range of motion and neck supple.     Right lower leg: No edema.     Left lower leg: No edema.  Skin:    General: Skin is warm and dry.  Neurological:     Mental Status: He is alert. Mental status is at baseline.     Cranial Nerves: No cranial nerve deficit.     Sensory: No sensory deficit.     Motor: No weakness.     Comments: Alert and oriented to self, place, and 2024 but did not know the month  Psychiatric:        Mood and Affect: Mood normal.        Behavior: Behavior normal.     ED Results / Procedures / Treatments   Labs (all labs ordered are listed, but only abnormal results are displayed) Labs Reviewed - No data to display  EKG None  Radiology No results found.  Procedures Procedures    Medications Ordered in ED Medications - No data to display  ED Course/ Medical Decision Making/ A&P Clinical Course as of 01/01/23 1454  Tue Jan 01, 2023  1348 Bertina talked to her twice [RP]    Clinical Course User Index [RP] Kerry Baton, MD                                 Medical Decision Making Risk Prescription drug management.   Kerry [name] is a 77 y.o. male with comorbidities that complicate the patient evaluation including dementia, seizures, bipolar, and  pancreatic cancer who presented to the emergency [name] with concerns for placement.    Initial Ddx:  Placement, seizure, UTI, delirium, dementia  MDM/Course:  Patient presents to the emergency [name] with 3 weeks of  worsening mental status.  Has had multiple workups in the emergency [name] which included a CT, MRI, urinalysis which have all been reassuring.  Has a nonfocal neuroexam and appears to be alert and oriented x 3.  I suspect that he might have undiagnosed dementia and may be sundowning.  I had extensive conversations with his healthcare POA, [facility] social worker, and our social worker in the emergency [name].  It does not appear that there are any additional resources that we can give them from the emergency [name] or have him board here.  His healthcare POA is setting him up for her 24/7 supervision which will start tonight.  There have been some delays with his healthcare POA filling out paperwork to facilitate his placement which she reports that she has completed today.  The [facility] worker will also continue to follow to see if they can find placement for him if indicated.  Upon re-evaluation patient remained stable.  Instructed to follow-up with his primary doctor and oncologist.  Return precautions discussed prior to discharge.  They are requesting something for sleep and according to the patient appears that he was previously on trazodone.  Will give him a very short course of this to help him sleep for the next few days.  This patient presents to the ED for concern of complaints listed in HPI, this involves an extensive number of treatment options, and is a complaint that carries with it a high risk of complications and morbidity. Disposition including potential need for admission considered.   Dispo: DC to Facility  Additional history obtained from  healthcare POA Records reviewed ED Visit Notes I have reviewed the patients home medications and made adjustments as  needed Consults: TOC Social Determinants of health:  SNF resident   Final Clinical Impression(s) / ED Diagnoses Final diagnoses:  Altered mental status, unspecified altered mental status type  Insomnia, unspecified type    Rx / DC Orders ED Discharge Orders          Ordered    zolpidem (AMBIEN) 10 MG tablet  At bedtime PRN        01/01/23 1449              Kerry Baton, MD 01/01/23 1454    Kerry Baton, MD 01/01/23 [phone]

## 2023-01-01 NOTE — Progress Notes (Addendum)
In short, this CSW has had extensive/multiple conversations with Placement SW, POA/caregiver, and VA SW. At this time, patient's POA/caregiver and external SW's (DSS/[facility]) are in agreement with pt's discharge back to [facility] with the following plan in place:  -POA to work with placement and VA SW's to secure 24/7 home assistance.  -VA SW states she will have pt's PCP send referral to neurology for formal dementia dx and request a Geriatric Screen (VA SW reports pt has LTC benefits covered at 100%) -VA SW states she will have PCP outreach to oncologist Smitty Cords) to inquire about plan for chemo treatment   CSW to send FL2 to placement SW, Chastiny ([email or web address]). POA requested Ambien - per EDP, he will provide some to assist with sleep; however, encouraged that pt follows up with his PCP. [facility] will transport pt back to [facility].   Placement SW: Franki Monte [phone]) [facility] SW: Currie Paris [phone]; ext [phone])

## 2023-01-01 NOTE — Discharge Instructions (Addendum)
You were seen for your intermittent confusion in the emergency department.  This may be due to age or an effect called sundowning.  At home, please continue to take all your medications.  You may take the trazodone for your sleep.  Check your MyChart online for the results of any tests that had not resulted by the time you left the emergency department.   Follow-up with your primary doctor in 2-3 days regarding your visit.  Follow-up with your oncology team about your pancreatic cancer.  Return immediately to the emergency department if you experience any of the following: Falls, seizures lasting more than 5 minutes, severe confusion, or any other concerning symptoms.    Thank you for visiting our Emergency Department. It was a pleasure taking care of you today.

## 2023-01-01 NOTE — ED Provider Notes (Signed)
[place] EMERGENCY DEPARTMENT AT [facility] Provider Note   CSN: [id number] Arrival date & time: 12/31/22  2311     History  Chief Complaint  Patient presents with   Altered Mental Status    Pt dx with UTI yesterday at [facility] and they sent the medication to a pharmacy pt has not been able to reach. Pt now with considerable confusion per EMS . Pt c/o not being able to sleep and has anxiety. Pt from [facility]- independent living , pt with early dementia     Radhames Esmaili is a 77 y.o. male.  The history is provided by the EMS personnel. The history is limited by the condition of the patient (Altered mental status).  Altered Mental Status He has history of dementia, seizures, bipolar disorder, pancreatic cancer and was sent from his assisted living facility for altered mentation.   Home Medications Prior to Admission medications   Medication Sig Start Date End Date Taking? Authorizing Provider  aspirin EC 81 MG tablet Take 1 tablet (81 mg total) by mouth daily. 10/28/21   Rodolph Bong, MD  cephALEXin (KEFLEX) 500 MG capsule Take 1 capsule (500 mg total) by mouth 2 (two) times daily for 7 days. 12/31/22 01/07/23  Dorthy Cooler, PA-C  Cholecalciferol (VITAMIN D) 50 MCG (2000 UT) tablet Take 2,000 Units by mouth at bedtime.    [provider]  clotrimazole (LOTRIMIN) 1 % external solution Apply 1 application  topically daily as needed (For dystrophic nails).    [provider]  divalproex (DEPAKOTE ER) 500 MG 24 hr tablet Take 2 tablets (1,000 mg total) by mouth at bedtime. 12/09/22 01/08/23  Dorcas Carrow, MD  donepezil (ARICEPT) 5 MG tablet Take 5 mg by mouth every morning. For memory    [provider]  Emollient (CETAPHIL) cream Apply 1 application. topically daily.    [provider]  escitalopram (LEXAPRO) 10 MG tablet Take 5 mg by mouth daily. For mental health    [provider]  ferrous gluconate  (FERGON) 324 MG tablet Take 324 mg by mouth daily. Take with Vitamin C/ascorbic acid    [provider]  finasteride (PROSCAR) 5 MG tablet Take 5 mg by mouth daily. For overactive bladder    [provider]  levETIRAcetam (KEPPRA) 750 MG tablet Take 2 tablets (1,500 mg total) by mouth 2 (two) times daily. 12/09/22   Dorcas Carrow, MD  melatonin 3 MG TABS tablet Take 3 mg by mouth at bedtime.    [provider]  nicotine (NICODERM CQ - DOSED IN MG/24 HOURS) 14 mg/24hr patch Place 14 mg onto the skin daily.    [provider]  pantoprazole (PROTONIX) 40 MG tablet Take 1 tablet (40 mg total) by mouth daily. 10/25/21   Rodolph Bong, MD  potassium chloride (KLOR-CON) 10 MEQ tablet Take 1 tablet (10 mEq total) by mouth daily for 5 days. 12/26/22 12/31/22  Anders Simmonds T, DO  QUEtiapine (SEROQUEL) 100 MG tablet Take 100 mg by mouth at bedtime. For mood and sleep    [provider]  rOPINIRole (REQUIP) 1 MG tablet Take 1 mg by mouth at bedtime.    [provider]  rosuvastatin (CRESTOR) 10 MG tablet Take 0.5 tablets (5 mg total) by mouth at bedtime. For cholesterol 11/01/21   Rodolph Bong, MD  traZODone (DESYREL) 150 MG tablet Take 75 mg by mouth at bedtime as needed for sleep.    [provider]  vitamin C (ASCORBIC ACID) 500 MG tablet Take 500 mg by mouth daily.    [provider]  zolpidem (AMBIEN) 10 MG tablet Take 5 mg by mouth at bedtime as needed for sleep.    [provider]      Allergies    Fluoride    Review of Systems   Review of Systems  Unable to perform ROS: Mental status change    Physical Exam Updated Vital Signs BP (!) 152/74   Pulse (!) 108   Temp 99.4 F (37.4 C) (Oral)   Resp 18   SpO2 99%  Physical Exam Vitals and nursing note reviewed.   77 year old male, resting comfortably and in no acute distress. Vital signs are significant for elevated blood pressure and slightly elevated  heart rate. Oxygen saturation is 99%, which is normal. Head is normocephalic and atraumatic. PERRLA. Neck is supple without adenopathy or JVD. Lungs are clear without rales, wheezes, or rhonchi. Chest moves symmetrically. Heart has regular rate and rhythm without murmur. Abdomen is soft, flat. Extremities have no cyanosis or edema. Skin is warm and dry without rash. Neurologic: Eyes are open but does not respond to verbal stimuli.  Eyes tend to deviate to the left, low amplitude seizure activity is noted involving face and arms.  He has noted to have urinary incontinence.  ED Results / Procedures / Treatments   Labs (all labs ordered are listed, but only abnormal results are displayed) Labs Reviewed  CBC WITH DIFFERENTIAL/PLATELET - Abnormal; Notable for the following components:      Result Value   RBC 3.95 (*)    Hemoglobin 11.8 (*)    HCT 38.9 (*)    Platelets 143 (*)    All other components within normal limits  COMPREHENSIVE METABOLIC PANEL - Abnormal; Notable for the following components:   CO2 17 (*)    Glucose, Bld 123 (*)    Anion gap 22 (*)    All other components within normal limits  VALPROIC ACID LEVEL - Abnormal; Notable for the following components:   Valproic Acid Lvl 36 (*)    All other components within normal limits  URINALYSIS, ROUTINE W REFLEX MICROSCOPIC - Abnormal; Notable for the following components:   Ketones, ur 20 (*)    All other components within normal limits  CBG MONITORING, ED - Abnormal; Notable for the following components:   Glucose-Capillary 123 (*)    All other components within normal limits  ETHANOL  RAPID URINE DRUG SCREEN, HOSP PERFORMED  LEVETIRACETAM LEVEL    EKG EKG Interpretation Date/Time:  Tuesday January 01 2023 03:29:38 EDT Ventricular Rate:  103 PR Interval:    QRS Duration:  91 QT Interval:  346 QTC Calculation: 453 R Axis:   39  Text Interpretation: Sinus rhythm Ventricular premature complex Borderline  repolarization abnormality When compared with ECG of 12/18/2022, Premature ventricular complexes are now present Confirmed by Dione Booze ([id number]) on 01/01/2023 3:34:06 AM  Procedures Procedures  Cardiac monitor shows normal sinus rhythm, per my interpretation.  Medications Ordered in ED Medications  nicotine (NICODERM CQ - dosed in mg/24 hours) patch 21 mg (21 mg Transdermal Patch Applied 01/01/23 0023)  cephALEXin (KEFLEX) capsule 500 mg (has no administration in time range)  LORazepam (ATIVAN) 2 MG/ML injection (1 mg  Given 01/01/23 0338)  levETIRAcetam (KEPPRA) IVPB 1000 mg/100 mL premix (0 mg Intravenous Stopped 01/01/23 0414)    ED Course/ Medical Decision Making/ A&P  Medical Decision Making Amount and/or Complexity of Data Reviewed Labs: ordered.  Risk Prescription drug management.   Altered mental status secondary to seizures.  I have reviewed his past records, and he is supposed to be on levetiracetam and valproic acid (valproic acid may be for behavioral issues more than seizures).  I have reviewed his past records, and he had been seen in the emergency department yesterday, 12/26/2022, 12/23/2022, 12/18/2022, 12/17/2022 for mental status changes.  He had been admitted 12/07/2022-12/09/2022 for metabolic encephalopathy secondary to seizures.  Levetiracetam level was subtherapeutic on presentation.  He has had 2 MRI scans of his brain, 4 CT scans of his head done over the last month, none of which showed any acute process.  I am not sure if he has been taking his medications as prescribed.  He is supposed to be on levetiracetam 1500 mg twice a day and divalproex 1000 mg at bedtime.  I have ordered levels of both of those as well as screening labs of CBC, comprehensive metabolic panel, urine drug screen.  I ordered intravenous lorazepam and a loading dose of levetiracetam.  Following above-noted treatment, patient stopped seizing and became awake and oriented.   I have reviewed and interpreted his laboratory tests and my interpretation is mild ketonuria, subtherapeutic valproic acid level, stable anemia, high anion gap metabolic acidosis secondary to active seizure when lab was drawn, elevated random glucose level.  Patient does admit that he has missed some doses of his levetiracetam.  I have ordered a levetiracetam level, and I am discharging him with instructions to take his levetiracetam every day as prescribed.  Also, at an emergency department visit yesterday he was prescribed cephalexin for cystitis, but patient stated he was unable to get to the pharmacy that it was sent to.  I am discharging him with a new paper prescription for cephalexin and I have ordered a dose of cephalexin prior to discharge.  CRITICAL CARE Performed by: Dione Booze Total critical care time: 45 minutes Critical care time was exclusive of separately billable procedures and treating other patients. Critical care was necessary to treat or prevent imminent or life-threatening deterioration. Critical care was time spent personally by me on the following activities: development of treatment plan with patient and/or surrogate as well as nursing, discussions with consultants, evaluation of patient's response to treatment, examination of patient, obtaining history from patient or surrogate, ordering and performing treatments and interventions, ordering and review of laboratory studies, ordering and review of radiographic studies, pulse oximetry and re-evaluation of patient's condition.  Final Clinical Impression(s) / ED Diagnoses Final diagnoses:  Seizure (HCC)  Elevated random blood glucose level  Normochromic normocytic anemia    Rx / DC Orders ED Discharge Orders          Ordered    cephALEXin (KEFLEX) 500 MG capsule  2 times daily        01/01/23 0659              Dione Booze, MD 01/01/23 [phone]

## 2023-01-01 NOTE — ED Notes (Signed)
POA contacted to ensure she would be at property at time of [facility] arrival. POA said someone would be there at 2030. [facility] aware.

## 2023-01-01 NOTE — ED Notes (Addendum)
Patient's Primary Care physician at the [facility] called and would like to be the primary point of contact for when social work sees the patient.   Contact info: Currie Paris [phone]

## 2023-01-01 NOTE — [place] FL2 (Signed)
[place] MEDICAID FL2 LEVEL OF CARE FORM     IDENTIFICATION  Patient Name: Kerry [name] Birthdate: 09-30-1945 Sex: male Admission Date (Current Location): 01/01/2023  [facility] and [place] Number:  Producer, television/film/video and Address:  [facility] [address],  [address] [place], [place] [address]      Provider Number: [id number]  Attending Physician Name and Address:  Rondel Baton, MD  Relative Name and Phone Number:  [name] (Other)  [phone]    Current Level of Care: Hospital Recommended Level of Care: Assisted Living Facility, Other (Comment) (Long Term Care) Prior Approval Number:    Date Approved/Denied:   PASRR Number:    Discharge Plan:  (Long Term Care)    Current Diagnoses: Patient Active Problem List   Diagnosis Date Noted   Aggressive behavior 11/22/2021   Status epilepticus (HCC) 11/11/2021   Postictal confusion 11/11/2021   Altered mental status 11/02/2021   Generalized weakness    Jaundice    Dark stools 10/24/2021   Serum ammonia increased (HCC) 10/22/2021   Mixed hyperlipidemia 10/21/2021   Restless leg syndrome 10/21/2021   BPH (benign prostatic hyperplasia) 10/21/2021   Seizure disorder (HCC) 10/21/2021   Common bile duct obstruction    Pancreatic lesion    Weight loss    Cholestatic jaundice 10/20/2021   Bipolar I disorder (HCC) 10/20/2021   Hypokalemia 10/20/2021   Lesion of left native kidney 10/20/2021    Orientation RESPIRATION BLADDER Height & Weight     Self, Situation, Place  Normal Incontinent Weight:   Height:     BEHAVIORAL SYMPTOMS/MOOD NEUROLOGICAL BOWEL NUTRITION STATUS      Incontinent Diet (Regular)  AMBULATORY STATUS COMMUNICATION OF NEEDS Skin   Independent Verbally Normal                       Personal Care Assistance Level of Assistance  Bathing, Feeding, Dressing Bathing Assistance: Limited assistance Feeding assistance: Limited assistance Dressing Assistance: Limited assistance      Functional Limitations Info  Sight, Hearing, Speech Sight Info: Adequate Hearing Info: Adequate Speech Info: Adequate    SPECIAL CARE FACTORS FREQUENCY                       Contractures Contractures Info: Not present    Additional Factors Info  Code Status, Allergies Code Status Info: Full Allergies Info: Fluoride           Current Medications (01/01/2023):  This is the current hospital active medication list No current facility-administered medications for this encounter.   Current Outpatient Medications  Medication Sig Dispense Refill   aspirin EC 81 MG tablet Take 1 tablet (81 mg total) by mouth daily. 30 tablet 12   cephALEXin (KEFLEX) 500 MG capsule Take 1 capsule (500 mg total) by mouth 2 (two) times daily for 7 days. 14 capsule 0   Cholecalciferol (VITAMIN D) 50 MCG (2000 UT) tablet Take 2,000 Units by mouth at bedtime.     clotrimazole (LOTRIMIN) 1 % external solution Apply 1 application  topically daily as needed (For dystrophic nails).     divalproex (DEPAKOTE ER) 500 MG 24 hr tablet Take 2 tablets (1,000 mg total) by mouth at bedtime. 60 tablet 0   donepezil (ARICEPT) 5 MG tablet Take 5 mg by mouth every morning. For memory     Emollient (CETAPHIL) cream Apply 1 application. topically daily.     escitalopram (LEXAPRO) 10 MG tablet Take 5 mg by mouth  daily. For mental health     ferrous gluconate (FERGON) 324 MG tablet Take 324 mg by mouth daily. Take with Vitamin C/ascorbic acid     finasteride (PROSCAR) 5 MG tablet Take 5 mg by mouth daily. For overactive bladder     levETIRAcetam (KEPPRA) 750 MG tablet Take 2 tablets (1,500 mg total) by mouth 2 (two) times daily. 60 tablet 0   melatonin 3 MG TABS tablet Take 3 mg by mouth at bedtime.     nicotine (NICODERM CQ - DOSED IN MG/24 HOURS) 14 mg/24hr patch Place 14 mg onto the skin daily.     pantoprazole (PROTONIX) 40 MG tablet Take 1 tablet (40 mg total) by mouth daily. 30 tablet 1   potassium chloride  (KLOR-CON) 10 MEQ tablet Take 1 tablet (10 mEq total) by mouth daily for 5 days. 5 tablet 0   QUEtiapine (SEROQUEL) 100 MG tablet Take 100 mg by mouth at bedtime. For mood and sleep     rOPINIRole (REQUIP) 1 MG tablet Take 1 mg by mouth at bedtime.     rosuvastatin (CRESTOR) 10 MG tablet Take 0.5 tablets (5 mg total) by mouth at bedtime. For cholesterol     traZODone (DESYREL) 150 MG tablet Take 0.5 tablets (75 mg total) by mouth at bedtime as needed for up to 10 days for sleep. 5 tablet 0   vitamin C (ASCORBIC ACID) 500 MG tablet Take 500 mg by mouth daily.       Discharge Medications: Please see discharge summary for a list of discharge medications.  Relevant Imaging Results:  Relevant Lab Results:   Additional Information SSN: [id number]  Princella Ion, LCSW

## 2023-01-04 ENCOUNTER — Encounter (HOSPITAL_COMMUNITY): Payer: Self-pay

## 2023-01-04 ENCOUNTER — Other Ambulatory Visit: Payer: Self-pay

## 2023-01-04 ENCOUNTER — Inpatient Hospital Stay (HOSPITAL_COMMUNITY)
Admission: EM | Admit: 2023-01-04 | Discharge: 2023-01-12 | DRG: 064 | Disposition: A | Discharge status: Home / Self Care | Payer: No Typology Code available for payment source | Attending: Family Medicine | Admitting: Family Medicine

## 2023-01-04 ENCOUNTER — Emergency Department (HOSPITAL_COMMUNITY): Payer: No Typology Code available for payment source

## 2023-01-04 DIAGNOSIS — C259 Malignant neoplasm of pancreas, unspecified: Secondary | ICD-10-CM | POA: Diagnosis present

## 2023-01-04 DIAGNOSIS — R0789 Other chest pain: Principal | ICD-10-CM

## 2023-01-04 DIAGNOSIS — I639 Cerebral infarction, unspecified: Secondary | ICD-10-CM | POA: Diagnosis present

## 2023-01-04 DIAGNOSIS — I6340 Cerebral infarction due to embolism of unspecified cerebral artery: Secondary | ICD-10-CM

## 2023-01-04 DIAGNOSIS — C801 Malignant (primary) neoplasm, unspecified: Secondary | ICD-10-CM

## 2023-01-04 DIAGNOSIS — N3281 Overactive bladder: Secondary | ICD-10-CM | POA: Diagnosis present

## 2023-01-04 DIAGNOSIS — Z789 Other specified health status: Secondary | ICD-10-CM

## 2023-01-04 DIAGNOSIS — F03918 Unspecified dementia, unspecified severity, with other behavioral disturbance: Secondary | ICD-10-CM | POA: Diagnosis present

## 2023-01-04 DIAGNOSIS — F4310 Post-traumatic stress disorder, unspecified: Secondary | ICD-10-CM | POA: Diagnosis present

## 2023-01-04 DIAGNOSIS — F0393 Unspecified dementia, unspecified severity, with mood disturbance: Secondary | ICD-10-CM | POA: Diagnosis present

## 2023-01-04 DIAGNOSIS — I10 Essential (primary) hypertension: Secondary | ICD-10-CM | POA: Diagnosis present

## 2023-01-04 DIAGNOSIS — R29704 NIHSS score 4: Secondary | ICD-10-CM | POA: Diagnosis present

## 2023-01-04 DIAGNOSIS — D61818 Other pancytopenia: Secondary | ICD-10-CM | POA: Diagnosis present

## 2023-01-04 DIAGNOSIS — D61810 Antineoplastic chemotherapy induced pancytopenia: Secondary | ICD-10-CM | POA: Diagnosis present

## 2023-01-04 DIAGNOSIS — F05 Delirium due to known physiological condition: Secondary | ICD-10-CM | POA: Diagnosis present

## 2023-01-04 DIAGNOSIS — I63411 Cerebral infarction due to embolism of right middle cerebral artery: Principal | ICD-10-CM | POA: Diagnosis present

## 2023-01-04 DIAGNOSIS — F319 Bipolar disorder, unspecified: Secondary | ICD-10-CM | POA: Diagnosis present

## 2023-01-04 DIAGNOSIS — F03B18 Unspecified dementia, moderate, with other behavioral disturbance: Secondary | ICD-10-CM | POA: Diagnosis present

## 2023-01-04 DIAGNOSIS — Z6822 Body mass index (BMI) 22.0-22.9, adult: Secondary | ICD-10-CM

## 2023-01-04 DIAGNOSIS — G2581 Restless legs syndrome: Secondary | ICD-10-CM | POA: Diagnosis present

## 2023-01-04 DIAGNOSIS — Z658 Other specified problems related to psychosocial circumstances: Secondary | ICD-10-CM

## 2023-01-04 DIAGNOSIS — G40909 Epilepsy, unspecified, not intractable, without status epilepticus: Secondary | ICD-10-CM | POA: Diagnosis present

## 2023-01-04 DIAGNOSIS — F17210 Nicotine dependence, cigarettes, uncomplicated: Secondary | ICD-10-CM | POA: Diagnosis present

## 2023-01-04 DIAGNOSIS — R471 Dysarthria and anarthria: Secondary | ICD-10-CM

## 2023-01-04 DIAGNOSIS — Z888 Allergy status to other drugs, medicaments and biological substances status: Secondary | ICD-10-CM

## 2023-01-04 DIAGNOSIS — G4733 Obstructive sleep apnea (adult) (pediatric): Secondary | ICD-10-CM | POA: Diagnosis present

## 2023-01-04 DIAGNOSIS — Z79899 Other long term (current) drug therapy: Secondary | ICD-10-CM

## 2023-01-04 DIAGNOSIS — E876 Hypokalemia: Secondary | ICD-10-CM | POA: Diagnosis present

## 2023-01-04 DIAGNOSIS — F03B0 Unspecified dementia, moderate, without behavioral disturbance, psychotic disturbance, mood disturbance, and anxiety: Secondary | ICD-10-CM | POA: Diagnosis present

## 2023-01-04 DIAGNOSIS — R634 Abnormal weight loss: Secondary | ICD-10-CM | POA: Diagnosis present

## 2023-01-04 DIAGNOSIS — R531 Weakness: Secondary | ICD-10-CM

## 2023-01-04 DIAGNOSIS — R55 Syncope and collapse: Secondary | ICD-10-CM | POA: Diagnosis not present

## 2023-01-04 DIAGNOSIS — E43 Unspecified severe protein-calorie malnutrition: Secondary | ICD-10-CM | POA: Diagnosis present

## 2023-01-04 DIAGNOSIS — Z7982 Long term (current) use of aspirin: Secondary | ICD-10-CM

## 2023-01-04 DIAGNOSIS — E785 Hyperlipidemia, unspecified: Secondary | ICD-10-CM | POA: Diagnosis present

## 2023-01-04 DIAGNOSIS — F17200 Nicotine dependence, unspecified, uncomplicated: Secondary | ICD-10-CM | POA: Diagnosis present

## 2023-01-04 DIAGNOSIS — F0394 Unspecified dementia, unspecified severity, with anxiety: Secondary | ICD-10-CM | POA: Diagnosis present

## 2023-01-04 DIAGNOSIS — R29810 Facial weakness: Secondary | ICD-10-CM | POA: Diagnosis present

## 2023-01-04 DIAGNOSIS — R410 Disorientation, unspecified: Secondary | ICD-10-CM | POA: Diagnosis not present

## 2023-01-04 DIAGNOSIS — G4700 Insomnia, unspecified: Secondary | ICD-10-CM | POA: Diagnosis present

## 2023-01-04 DIAGNOSIS — J449 Chronic obstructive pulmonary disease, unspecified: Secondary | ICD-10-CM | POA: Diagnosis present

## 2023-01-04 DIAGNOSIS — F0390 Unspecified dementia without behavioral disturbance: Secondary | ICD-10-CM

## 2023-01-04 LAB — COMPREHENSIVE METABOLIC PANEL
ALT: 16 U/L (ref 0–44)
AST: 24 U/L (ref 15–41)
Albumin: 3.1 g/dL — ABNORMAL LOW (ref 3.5–5.0)
Alkaline Phosphatase: 62 U/L (ref 38–126)
Anion gap: 10 (ref 5–15)
BUN: 16 mg/dL (ref 8–23)
CO2: 23 mmol/L (ref 22–32)
Calcium: 9 mg/dL (ref 8.9–10.3)
Chloride: 104 mmol/L (ref 98–111)
Creatinine, Ser: 0.76 mg/dL (ref 0.61–1.24)
GFR, Estimated: >60 mL/min (ref >60)
Glucose, Bld: 91 mg/dL (ref 70–99)
Potassium: 3.2 mmol/L — ABNORMAL LOW (ref 3.5–5.1)
Sodium: 137 mmol/L (ref 135–145)
Total Bilirubin: 0.4 mg/dL (ref 0.3–1.2)
Total Protein: 6.1 g/dL — ABNORMAL LOW (ref 6.5–8.1)

## 2023-01-04 LAB — CBC WITH DIFFERENTIAL/PLATELET
Abs Immature Granulocytes: 0.02 10*3/uL (ref 0.00–0.07)
Basophils Absolute: 0 10*3/uL (ref 0.0–0.1)
Basophils Relative: 1 %
Eosinophils Absolute: 0.1 10*3/uL (ref 0.0–0.5)
Eosinophils Relative: 2 %
HCT: 30.6 % — ABNORMAL LOW (ref 39.0–52.0)
Hemoglobin: 10.1 g/dL — ABNORMAL LOW (ref 13.0–17.0)
Immature Granulocytes: 1 %
Lymphocytes Relative: 28 %
Lymphs Abs: 1.1 10*3/uL (ref 0.7–4.0)
MCH: 31 pg (ref 26.0–34.0)
MCHC: 33 g/dL (ref 30.0–36.0)
MCV: 93.9 fL (ref 80.0–100.0)
Monocytes Absolute: 0.6 10*3/uL (ref 0.1–1.0)
Monocytes Relative: 14 %
Neutro Abs: 2.2 10*3/uL (ref 1.7–7.7)
Neutrophils Relative %: 54 %
Platelets: 80 10*3/uL — ABNORMAL LOW (ref 150–400)
RBC: 3.26 MIL/uL — ABNORMAL LOW (ref 4.22–5.81)
RDW: 14 % (ref 11.5–15.5)
WBC: 4 10*3/uL (ref 4.0–10.5)
nRBC: 0 % (ref 0.0–0.2)

## 2023-01-04 LAB — TROPONIN I (HIGH SENSITIVITY)
Troponin I (High Sensitivity): 7 ng/L (ref <18)
Troponin I (High Sensitivity): 5 ng/L (ref <18)

## 2023-01-04 MED ORDER — LORAZEPAM 1 MG PO TABS
0.5000 mg | ORAL_TABLET | Freq: Once | ORAL | Status: AC
  Administered 2023-01-04: 0.5 mg via ORAL
  Filled 2023-01-04: qty 1

## 2023-01-04 MED ORDER — POTASSIUM CHLORIDE CRYS ER 20 MEQ PO TBCR
40.0000 meq | EXTENDED_RELEASE_TABLET | Freq: Once | ORAL | Status: AC
  Administered 2023-01-04: 40 meq via ORAL
  Filled 2023-01-04: qty 2
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
## 2023-01-04 NOTE — ED Notes (Signed)
Attempted to contact [name] who [facility] states was pt's POA. First attempt, phone was answered and then immediately hung up. Retried call and no answer. Voicemail left.

## 2023-01-04 NOTE — Discharge Instructions (Addendum)
Dear Kerry [name],   Thank you so much for allowing [place] to be part of your care!  You were admitted to [facility] for for a stroke.  Stroke was identified on imaging and you were followed by our neurologist.  Neurology recommended putting you on both Aspirin and Plavix to prevent future strokes. Please take medications as instructed on discharge paperwork.  Psychiatry recommended discontinuing your Ambien, mirtazapine, Seroquel, melatonin to avoid making you too sleepy or drowsy. You were switched to Zyprexa to help control any agitation or behavioral outburst. Please discontinue smoking.    POST-HOSPITAL & CARE INSTRUCTIONS Please continue to take aspirin and Plavix until August 10th. You can swithc to taking only aspirin after that Please let PCP/Specialists know of any changes that were made.  Please see medications section of this packet for any medication changes.   DOCTOR'S APPOINTMENT & FOLLOW UP CARE INSTRUCTIONS  [facility]  RETURN PRECAUTIONS:   Take care and be well!  Family Medicine Teaching Service  [place]  [facility]  [address] [place], [place] [address] [phone]

## 2023-01-04 NOTE — ED Notes (Signed)
Attempted to contact [place] to determine is transportation was available since pt is d/c'd. Per the couple that manages [facility], transportation is only available Mon to Fri. They also stated that pt is not allowed to return to his apartment unless someone is able to stay with him because "he is a danger to himself and the building". Was informed to contact pt's POA. No further information was provided to this RN.
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
## 2023-01-05 MED ORDER — MELATONIN 5 MG PO TABS
5.0000 mg | ORAL_TABLET | Freq: Every day | ORAL | Status: DC
  Administered 2023-01-05 – 2023-01-08 (×4): 5 mg via ORAL
  Filled 2023-01-05 (×4): qty 1

## 2023-01-05 NOTE — ED Notes (Addendum)
Error
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
## 2023-01-05 NOTE — ED Notes (Signed)
Patient eating breakfast. °

## 2023-01-05 NOTE — Evaluation (Addendum)
Physical Therapy Evaluation Patient Details Name: Kerry [name] MRN: [id number] DOB: Nov 03, 1945 Today's Date: 01/05/2023  History of Present Illness  Pt is a 77 y.o. male who presented 01/04/23 with chest pain and brief syncopal episode. EKG is without evidence of acute ischemia.  Troponins are without elevation or significant delta. PMH: dementia, Anxiety, Bipolar d/o, OSA , Seizure d/o  pancreatic cancer stage III on Chemo   Clinical Impression  Pt presents with condition above and deficits mentioned below, see PT Problem List. PTA, he was residing alone in an apartment with an elevator access at an ILF, functioning independently without AD other than an aide setting up his pill box intermittently. Currently, pt demonstrates some mild deficits in balance but is capable of mobilizing without DME, LOB, or assistance. He is desiring follow-up with PT upon d/c, thus recommending HHPT. He appears to be functioning close to if not at his baseline. It appears there are some complications in regards to his behavior that may impact his ability to return to his ILF, per chart and RN. Will defer d/c location to [facility] team. Will continue to follow acutely.      Vitals -  123/66 & 75 bpm supine 116/63 & 76 bpm sitting (mild lightheadedness) 113/62 & 80 bpm standing (mild lightheadedness) 126/61 & 79 bpm sitting after standing a couple minutes (pt felt weak and sat down reporting unable to stand longer at the time) 110/61 supine end of session  after ambulating ~200 ft  *educated pt on changing positions slowly and moving arms and legs to improve blood flow and symptoms as needed, he verbalized understanding    If plan is discharge home, recommend the following: Direct supervision/assist for medications management;Direct supervision/assist for financial management;Assist for transportation   Can travel by private vehicle        Equipment Recommendations None recommended by PT  Recommendations for  Other Services       Functional Status Assessment Patient has had a recent decline in their functional status and demonstrates the ability to make significant improvements in function in a reasonable and predictable amount of time.     Precautions / Restrictions Precautions Precautions: Fall (low risk) Restrictions Weight Bearing Restrictions: No      Mobility  Bed Mobility Overal bed mobility: Modified Independent             General bed mobility comments: HOB elevated, no assistance needed    Transfers Overall transfer level: Needs assistance Equipment used: None Transfers: Sit to/from Stand Sit to Stand: Supervision           General transfer comment: Supervision for safety due to reported lightheadedness initially, but no LOB    Ambulation/Gait Ambulation/Gait assistance: Supervision Gait Distance (Feet): 200 Feet Assistive device: None Gait Pattern/deviations: Step-through pattern, Decreased stride length, Trunk flexed, Drifts right/left Gait velocity: reduced Gait velocity interpretation: 1.31 - 2.62 ft/sec, indicative of limited community ambulator   General Gait Details: Pt ambulates with a step-through gait pattern with a slightly slouched posture and mild sway noted, but no overt LOB, even when cued to turn and nod head, supervision for safety  Stairs            Wheelchair Mobility     Tilt Bed    Modified Rankin (Stroke Patients Only)       Balance Overall balance assessment: Mild deficits observed, not formally tested  Pertinent Vitals/Pain Pain Assessment Pain Assessment: Faces Faces Pain Scale: No hurt Pain Intervention(s): Monitored during session    Home Living Family/patient expects to be discharged to:: Assisted living (ILF) Living Arrangements: Alone Available Help at Discharge: Personal care attendant;Other (Comment) (for medication management) Type of Home:  Apartment Home Access: Elevator       Home Layout: One level Home Equipment: Grab bars - toilet;Grab bars - tub/shower Additional Comments: Walk in shower with grab bars. No shower seat. Toilet normal height.    Prior Function Prior Level of Function : Independent/Modified Independent             Mobility Comments: No AD, reports ~x1 fall/month ADLs Comments: Aide comes to manage meds in pill box for pt intermittently     Extremity/Trunk Assessment   Upper Extremity Assessment Upper Extremity Assessment: Overall WFL for tasks assessed    Lower Extremity Assessment Lower Extremity Assessment: Overall WFL for tasks assessed    Cervical / Trunk Assessment Cervical / Trunk Assessment: Kyphotic  Communication   Communication Communication: No apparent difficulties  Cognition Arousal: Alert Behavior During Therapy: WFL for tasks assessed/performed Overall Cognitive Status: History of cognitive impairments - at baseline                                 General Comments: Pt appropriate and cooperative throughout, A&Ox4. However, repeats himself at times but chart reports hx of dementia, likely close to if not at baseline        General Comments General comments (skin integrity, edema, etc.): Vitals - 123/66 & 75 bpm supine, 116/63 & 76 bpm sitting (mild lightheadedness), 113/62 & 80 bpm standing (mild lightheadedness), 126/61 & 79 bpm sitting after standing a couple minutes (pt felt weak and sat down reporting unable to stand longer at the time), 110/61 supine end of session  after ambulating ~200 ft; educated pt on changing positions slowly and moving arms and legs to improve blood flow and symptoms as needed, he verbalized understanding    Exercises     Assessment/Plan    PT Assessment Patient needs continued PT services  PT Problem List Decreased activity tolerance;Decreased balance;Decreased mobility       PT Treatment Interventions DME  instruction;Gait training;Functional mobility training;Therapeutic activities;Therapeutic exercise;Balance training;Neuromuscular re-education;Cognitive remediation;Patient/family education    PT Goals (Current goals can be found in the Care Plan section)  Acute Rehab PT Goals Patient Stated Goal: to get HHPT PT Goal Formulation: With patient Time For Goal Achievement: 01/19/23 Potential to Achieve Goals: Good    Frequency Min 1X/week     Co-evaluation               AM-PAC PT "6 Clicks" Mobility  Outcome Measure Help needed turning from your back to your side while in a flat bed without using bedrails?: None Help needed moving from lying on your back to sitting on the side of a flat bed without using bedrails?: None Help needed moving to and from a bed to a chair (including a wheelchair)?: A Little Help needed standing up from a chair using your arms (e.g., wheelchair or bedside chair)?: A Little Help needed to walk in hospital room?: A Little Help needed climbing 3-5 steps with a railing? : A Little 6 Click Score: 20    End of Session Equipment Utilized During Treatment: Gait belt Activity Tolerance: Patient tolerated treatment well Patient left: in bed;with call bell/phone within reach;with  bed alarm set   PT Visit Diagnosis: Unsteadiness on feet (R26.81);Other abnormalities of gait and mobility (R26.89);History of falling (Z91.81)    Time: 1610-9604 PT Time Calculation (min) (ACUTE ONLY): 16 min   Charges:   PT Evaluation $PT Eval Low Complexity: 1 Low   PT General Charges $$ ACUTE PT VISIT: 1 Visit         Raymond Gurney, PT, DPT Acute Rehabilitation Services  Office: [phone]   Jewel Baize 01/05/2023, 9:21 AM

## 2023-01-05 NOTE — ED Notes (Signed)
Patient resting in bed with eyes closed, no s/s of distress will continue to monitor.

## 2023-01-06 MED ORDER — NICOTINE 14 MG/24HR TD PT24
14.0000 mg | MEDICATED_PATCH | Freq: Every day | TRANSDERMAL | Status: DC
  Administered 2023-01-06 – 2023-01-07 (×2): 14 mg via TRANSDERMAL
  Filled 2023-01-06 (×2): qty 1

## 2023-01-06 MED ORDER — QUETIAPINE FUMARATE 100 MG PO TABS
100.0000 mg | ORAL_TABLET | Freq: Every day | ORAL | Status: DC
  Administered 2023-01-06 – 2023-01-08 (×4): 100 mg via ORAL
  Filled 2023-01-06: qty 1
  Filled 2023-01-06 (×3): qty 4

## 2023-01-06 NOTE — ED Notes (Signed)
PT ambulated to the bathroom with assistance  

## 2023-01-06 NOTE — Progress Notes (Addendum)
2:30pm: CSW spoke with Dr. Durwin Nora who states he is willing to increase the patient's Seroquel dose to address his behaviors.  CSW spoke with [name] who states patient is not able to return to the facility because the current Seroquel dose he is on is not working and she does not believe any additional dose will help either.  10:27am: CSW received return call from [place] stating a sitter will be available for the patient at 6pm this evening. [name] stated patient must be prescribed a new medication to keep him calm to help address his behaviors at the facility. [name] states patient should not be prescribed Ambien as he was previously misusing the medication. [name] states the new medication is a requirement per facility staff. [name] states the prescription should be sent to [facility] at [address] [place].  CSW notified MD of request.  10am: CSW spoke with patient's [name] who states she is at church right now and will return call to CSW once the church service ends.  [name], MSW, LCSW Transitions of Care  Clinical Social Worker II [phone]

## 2023-01-06 NOTE — ED Notes (Signed)
Pt unsteady on his feet when ambulating. Pt refusing to use bedside commode or urinal.   Explained to pt this is for his safety. Pt assisted to bathroom.

## 2023-01-06 NOTE — ED Provider Notes (Signed)
Emergency Medicine Observation Re-evaluation Note  Kerry [name] is a 77 y.o. male, seen on rounds today.  Pt initially presented to the ED for complaints of Chest Pain and Loss of Consciousness Currently, the patient is sleeping.  Physical Exam  BP 123/70 (BP Location: Left Arm)   Pulse 72   Temp 98.5 F (36.9 C) (Oral)   Resp 17   SpO2 99%  Physical Exam General: Sleeping Cardiac: Extremities well-perfused Lungs: Breathing is unlabored Psych: Deferred  ED Course / MDM  EKG:EKG Interpretation Date/Time:  Friday January 04 2023 17:28:35 EDT Ventricular Rate:  64 PR Interval:  174 QRS Duration:  98 QT Interval:  444 QTC Calculation: 459 R Axis:   22  Text Interpretation: Sinus rhythm Abnormal R-wave progression, early transition Confirmed by Pricilla Loveless [phone]) on 01/05/2023 12:33:34 PM  I have reviewed the labs performed to date as well as medications administered while in observation.  Recent changes in the last 24 hours include social work has arranged for patient to return to his independent living facility.  Plan time of discharge is 6 PM.  Plan  Current plan is for discharge this evening.    Gloris Manchester, MD 01/06/23 1114

## 2023-01-07 ENCOUNTER — Emergency Department (HOSPITAL_COMMUNITY): Payer: No Typology Code available for payment source

## 2023-01-07 ENCOUNTER — Inpatient Hospital Stay (HOSPITAL_COMMUNITY): Payer: No Typology Code available for payment source

## 2023-01-07 DIAGNOSIS — I639 Cerebral infarction, unspecified: Secondary | ICD-10-CM | POA: Diagnosis not present

## 2023-01-07 DIAGNOSIS — R531 Weakness: Secondary | ICD-10-CM | POA: Diagnosis not present

## 2023-01-07 DIAGNOSIS — G4700 Insomnia, unspecified: Secondary | ICD-10-CM | POA: Diagnosis present

## 2023-01-07 DIAGNOSIS — F17210 Nicotine dependence, cigarettes, uncomplicated: Secondary | ICD-10-CM | POA: Diagnosis present

## 2023-01-07 DIAGNOSIS — Z789 Other specified health status: Secondary | ICD-10-CM | POA: Insufficient documentation

## 2023-01-07 DIAGNOSIS — G4733 Obstructive sleep apnea (adult) (pediatric): Secondary | ICD-10-CM | POA: Diagnosis present

## 2023-01-07 DIAGNOSIS — F0393 Unspecified dementia, unspecified severity, with mood disturbance: Secondary | ICD-10-CM | POA: Diagnosis present

## 2023-01-07 DIAGNOSIS — R634 Abnormal weight loss: Secondary | ICD-10-CM | POA: Diagnosis not present

## 2023-01-07 DIAGNOSIS — F4310 Post-traumatic stress disorder, unspecified: Secondary | ICD-10-CM | POA: Diagnosis present

## 2023-01-07 DIAGNOSIS — Z79899 Other long term (current) drug therapy: Secondary | ICD-10-CM | POA: Diagnosis not present

## 2023-01-07 DIAGNOSIS — C259 Malignant neoplasm of pancreas, unspecified: Secondary | ICD-10-CM | POA: Diagnosis present

## 2023-01-07 DIAGNOSIS — E876 Hypokalemia: Secondary | ICD-10-CM | POA: Diagnosis present

## 2023-01-07 DIAGNOSIS — G2581 Restless legs syndrome: Secondary | ICD-10-CM | POA: Diagnosis present

## 2023-01-07 DIAGNOSIS — G40909 Epilepsy, unspecified, not intractable, without status epilepticus: Secondary | ICD-10-CM | POA: Diagnosis present

## 2023-01-07 DIAGNOSIS — E785 Hyperlipidemia, unspecified: Secondary | ICD-10-CM | POA: Diagnosis present

## 2023-01-07 DIAGNOSIS — D61810 Antineoplastic chemotherapy induced pancytopenia: Secondary | ICD-10-CM | POA: Diagnosis present

## 2023-01-07 DIAGNOSIS — I6340 Cerebral infarction due to embolism of unspecified cerebral artery: Secondary | ICD-10-CM | POA: Diagnosis not present

## 2023-01-07 DIAGNOSIS — I10 Essential (primary) hypertension: Secondary | ICD-10-CM | POA: Diagnosis present

## 2023-01-07 DIAGNOSIS — I63411 Cerebral infarction due to embolism of right middle cerebral artery: Secondary | ICD-10-CM | POA: Diagnosis present

## 2023-01-07 DIAGNOSIS — R471 Dysarthria and anarthria: Secondary | ICD-10-CM | POA: Diagnosis present

## 2023-01-07 DIAGNOSIS — F0394 Unspecified dementia, unspecified severity, with anxiety: Secondary | ICD-10-CM | POA: Diagnosis present

## 2023-01-07 DIAGNOSIS — F319 Bipolar disorder, unspecified: Secondary | ICD-10-CM | POA: Diagnosis present

## 2023-01-07 DIAGNOSIS — F0390 Unspecified dementia without behavioral disturbance: Secondary | ICD-10-CM | POA: Diagnosis not present

## 2023-01-07 DIAGNOSIS — F03918 Unspecified dementia, unspecified severity, with other behavioral disturbance: Secondary | ICD-10-CM | POA: Diagnosis present

## 2023-01-07 DIAGNOSIS — R29810 Facial weakness: Secondary | ICD-10-CM | POA: Diagnosis present

## 2023-01-07 DIAGNOSIS — J449 Chronic obstructive pulmonary disease, unspecified: Secondary | ICD-10-CM | POA: Diagnosis present

## 2023-01-07 DIAGNOSIS — E43 Unspecified severe protein-calorie malnutrition: Secondary | ICD-10-CM | POA: Diagnosis present

## 2023-01-07 DIAGNOSIS — F05 Delirium due to known physiological condition: Secondary | ICD-10-CM | POA: Diagnosis present

## 2023-01-07 DIAGNOSIS — R55 Syncope and collapse: Secondary | ICD-10-CM | POA: Diagnosis present

## 2023-01-07 DIAGNOSIS — Z6822 Body mass index (BMI) 22.0-22.9, adult: Secondary | ICD-10-CM | POA: Diagnosis not present

## 2023-01-07 DIAGNOSIS — Z658 Other specified problems related to psychosocial circumstances: Secondary | ICD-10-CM | POA: Diagnosis not present

## 2023-01-07 DIAGNOSIS — R29704 NIHSS score 4: Secondary | ICD-10-CM | POA: Diagnosis present

## 2023-01-07 LAB — ETHANOL: Alcohol, Ethyl (B): <10 mg/dL (ref <10)

## 2023-01-07 LAB — DIFFERENTIAL
Abs Immature Granulocytes: 0.02 10*3/uL (ref 0.00–0.07)
Basophils Absolute: 0 10*3/uL (ref 0.0–0.1)
Basophils Relative: 1 %
Eosinophils Absolute: 0.1 10*3/uL (ref 0.0–0.5)
Eosinophils Relative: 2 %
Immature Granulocytes: 1 %
Lymphocytes Relative: 27 %
Lymphs Abs: 1.1 10*3/uL (ref 0.7–4.0)
Monocytes Absolute: 0.5 10*3/uL (ref 0.1–1.0)
Monocytes Relative: 12 %
Neutro Abs: 2.3 10*3/uL (ref 1.7–7.7)
Neutrophils Relative %: 57 %

## 2023-01-07 LAB — COMPREHENSIVE METABOLIC PANEL
ALT: 18 U/L (ref 0–44)
AST: 25 U/L (ref 15–41)
Albumin: 3.2 g/dL — ABNORMAL LOW (ref 3.5–5.0)
Alkaline Phosphatase: 62 U/L (ref 38–126)
Anion gap: 10 (ref 5–15)
BUN: 15 mg/dL (ref 8–23)
CO2: 25 mmol/L (ref 22–32)
Calcium: 8.7 mg/dL — ABNORMAL LOW (ref 8.9–10.3)
Chloride: 102 mmol/L (ref 98–111)
Creatinine, Ser: 0.63 mg/dL (ref 0.61–1.24)
GFR, Estimated: >60 mL/min (ref >60)
Glucose, Bld: 95 mg/dL (ref 70–99)
Potassium: 3.4 mmol/L — ABNORMAL LOW (ref 3.5–5.1)
Sodium: 137 mmol/L (ref 135–145)
Total Bilirubin: 0.5 mg/dL (ref 0.3–1.2)
Total Protein: 6.3 g/dL — ABNORMAL LOW (ref 6.5–8.1)

## 2023-01-07 LAB — PROTIME-INR
INR: 1.2 (ref 0.8–1.2)
Prothrombin Time: 15.8 seconds — ABNORMAL HIGH (ref 11.4–15.2)

## 2023-01-07 LAB — LIPID PANEL
Cholesterol: 102 mg/dL (ref 0–200)
HDL: 39 mg/dL — ABNORMAL LOW (ref >40)
LDL Cholesterol: 55 mg/dL (ref 0–99)
Total CHOL/HDL Ratio: 2.6 RATIO
Triglycerides: 38 mg/dL (ref <150)
VLDL: 8 mg/dL (ref 0–40)

## 2023-01-07 LAB — APTT: aPTT: 33 seconds (ref 24–36)

## 2023-01-07 LAB — RAPID URINE DRUG SCREEN, HOSP PERFORMED
Amphetamines: NOT DETECTED
Barbiturates: NOT DETECTED
Benzodiazepines: NOT DETECTED
Cocaine: NOT DETECTED
Opiates: NOT DETECTED
Tetrahydrocannabinol: NOT DETECTED

## 2023-01-07 LAB — CBC
HCT: 30.7 % — ABNORMAL LOW (ref 39.0–52.0)
Hemoglobin: 10 g/dL — ABNORMAL LOW (ref 13.0–17.0)
MCH: 29.6 pg (ref 26.0–34.0)
MCHC: 32.6 g/dL (ref 30.0–36.0)
MCV: 90.8 fL (ref 80.0–100.0)
Platelets: 89 10*3/uL — ABNORMAL LOW (ref 150–400)
RBC: 3.38 MIL/uL — ABNORMAL LOW (ref 4.22–5.81)
RDW: 14 % (ref 11.5–15.5)
WBC: 4 10*3/uL (ref 4.0–10.5)
nRBC: 0 % (ref 0.0–0.2)

## 2023-01-07 MED ORDER — POTASSIUM CHLORIDE 20 MEQ PO PACK
40.0000 meq | PACK | Freq: Once | ORAL | Status: AC
  Administered 2023-01-07: 40 meq via ORAL
  Filled 2023-01-07: qty 2

## 2023-01-07 MED ORDER — SODIUM CHLORIDE 0.9% FLUSH
10.0000 mL | INTRAVENOUS | Status: DC | PRN
  Administered 2023-01-08: 10 mL

## 2023-01-07 MED ORDER — SENNA 8.6 MG PO TABS
1.0000 | ORAL_TABLET | Freq: Every day | ORAL | Status: DC
  Administered 2023-01-07 – 2023-01-11 (×5): 8.6 mg via ORAL
  Filled 2023-01-07 (×5): qty 1

## 2023-01-07 MED ORDER — ROSUVASTATIN CALCIUM 5 MG PO TABS
5.0000 mg | ORAL_TABLET | Freq: Every day | ORAL | Status: DC
  Administered 2023-01-07: 5 mg via ORAL
  Filled 2023-01-07: qty 1

## 2023-01-07 MED ORDER — HALOPERIDOL LACTATE 5 MG/ML IJ SOLN: 2.0000 mg | Freq: Four times a day (QID) | INTRAMUSCULAR | Status: DC | PRN

## 2023-01-07 MED ORDER — ENOXAPARIN SODIUM 40 MG/0.4ML IJ SOSY
40.0000 mg | PREFILLED_SYRINGE | INTRAMUSCULAR | Status: DC
  Administered 2023-01-07 – 2023-01-11 (×5): 40 mg via SUBCUTANEOUS
  Filled 2023-01-07 (×6): qty 0.4

## 2023-01-07 MED ORDER — LEVETIRACETAM 750 MG PO TABS
1500.0000 mg | ORAL_TABLET | Freq: Two times a day (BID) | ORAL | Status: DC
  Administered 2023-01-07 – 2023-01-12 (×11): 1500 mg via ORAL
  Filled 2023-01-07 (×2): qty 2
  Filled 2023-01-07: qty 3
  Filled 2023-01-07 (×5): qty 2
  Filled 2023-01-07: qty 3
  Filled 2023-01-07 (×2): qty 2

## 2023-01-07 MED ORDER — NICOTINE 21 MG/24HR TD PT24
21.0000 mg | MEDICATED_PATCH | Freq: Every day | TRANSDERMAL | Status: DC
  Administered 2023-01-10: 21 mg via TRANSDERMAL
  Filled 2023-01-07: qty 1

## 2023-01-07 MED ORDER — POLYETHYLENE GLYCOL 3350 17 G PO PACK
17.0000 g | PACK | Freq: Every day | ORAL | Status: DC
  Administered 2023-01-10 – 2023-01-12 (×2): 17 g via ORAL
  Filled 2023-01-07 (×3): qty 1

## 2023-01-07 MED ORDER — SODIUM CHLORIDE 0.9% FLUSH
10.0000 mL | Freq: Two times a day (BID) | INTRAVENOUS | Status: DC
  Administered 2023-01-07 – 2023-01-11 (×8): 10 mL

## 2023-01-07 MED ORDER — DONEPEZIL HCL 10 MG PO TABS
5.0000 mg | ORAL_TABLET | Freq: Every morning | ORAL | Status: DC
  Administered 2023-01-08 – 2023-01-12 (×5): 5 mg via ORAL
  Filled 2023-01-07 (×5): qty 1

## 2023-01-07 MED ORDER — CHLORHEXIDINE GLUCONATE CLOTH 2 % EX PADS
6.0000 | MEDICATED_PAD | Freq: Every day | CUTANEOUS | Status: DC
  Administered 2023-01-07 – 2023-01-12 (×6): 6 via TOPICAL

## 2023-01-07 MED ORDER — CLOPIDOGREL BISULFATE 75 MG PO TABS
75.0000 mg | ORAL_TABLET | Freq: Every day | ORAL | Status: DC
  Administered 2023-01-07 – 2023-01-12 (×6): 75 mg via ORAL
  Filled 2023-01-07 (×6): qty 1

## 2023-01-07 MED ORDER — HALOPERIDOL LACTATE 5 MG/ML IJ SOLN
2.0000 mg | Freq: Four times a day (QID) | INTRAMUSCULAR | Status: DC | PRN
  Administered 2023-01-07 – 2023-01-08 (×2): 2 mg via INTRAVENOUS
  Filled 2023-01-07 (×2): qty 1

## 2023-01-07 MED ORDER — NICOTINE 14 MG/24HR TD PT24: 14.0000 mg | MEDICATED_PATCH | Freq: Every day | TRANSDERMAL | Status: DC

## 2023-01-07 MED ORDER — POLYETHYLENE GLYCOL 3350 17 G PO PACK: 17.0000 g | PACK | Freq: Every day | ORAL | Status: DC

## 2023-01-07 MED ORDER — ROPINIROLE HCL 1 MG PO TABS
1.0000 mg | ORAL_TABLET | Freq: Every day | ORAL | Status: DC
  Administered 2023-01-07 – 2023-01-09 (×3): 1 mg via ORAL
  Filled 2023-01-07 (×3): qty 1

## 2023-01-07 MED ORDER — IOHEXOL 350 MG/ML SOLN
75.0000 mL | Freq: Once | INTRAVENOUS | Status: AC | PRN
  Administered 2023-01-07: 75 mL via INTRAVENOUS

## 2023-01-07 MED ORDER — ASPIRIN 81 MG PO TBEC
81.0000 mg | DELAYED_RELEASE_TABLET | Freq: Every day | ORAL | Status: DC
  Administered 2023-01-07 – 2023-01-12 (×6): 81 mg via ORAL
  Filled 2023-01-07 (×6): qty 1

## 2023-01-07 MED ORDER — DIVALPROEX SODIUM ER 500 MG PO TB24
1000.0000 mg | ORAL_TABLET | Freq: Every day | ORAL | Status: DC
  Administered 2023-01-07 – 2023-01-11 (×5): 1000 mg via ORAL
  Filled 2023-01-07 (×6): qty 2

## 2023-01-07 NOTE — Hospital Course (Addendum)
Kerry [name] is a 77 y.o.male with a history of pancreatic cancer, dementia, bipolar 1, epilepsy, tobacco use disorder who was admitted to the [facility] Medicine Teaching Service at [facility] for dysarthria with acute MCA infarct. His hospital course is detailed below:  Dysarthria s/t MCA stroke Pt presented with slurred speech and intermittent episodes of "slurred speech, eyes fluttering, and dizziness and weakness." He was concerned that he was having mini-strokes. CT head wo contrast showed acute infarct in the right MCA territory involving the insula with no hemorrhage. MRI brain showed acute infarcts involving the right insula, temporoparietal cortex, and medial frontal cortex with no evidence of hemorrhage. Plavix 75mg  daily was started. Neurology recommended both aspirin 81 and plavix for 3 weeks, followed by just aspirin 81.  He was started on Crestor 5 mg for dyslipidemia.  Delirium Patient was delirious multiple nights during admission requiring haldol and ativan. The delirium is complicated by the patients baseline dementia and Bipolar 1 disorder.  Psychiatry recommended discontinuing some of his home medications (seroquel, trazodone, mirtazapine and melatonin). Ropinirole was discontinued to not worsen hypersexual behaviors. He switched to Zyprexa 5 mg due effectiveness on prior [name] admission.  Given patient's illicit Ambien use prior (20 mg at bedtime) to admission he was started on an Ambien taper (3.5 mg at bedtime) and discontinued at .  Zyprexa was increased to 7.5 mg nightly and 5 mg availible q8h prn.   Anemia Hemoglobin at 9.0, with decreased TIBC, suspected iron deficiency anemia in the setting of pancreatic cancer. Patient was offered supplementation however declined.   Other chronic conditions were medically managed with home medications and formulary alternatives as necessary (seizure disorder, Bipolar 1) Bipolar:  Depakote 1000 at bedtime Seizures:  Keppra 1500 twice per  day Dementia ropinirole 1 mg>> 0.5(discontinued per psych's recs) and donepezil 5 mg qAM  PCP Follow-up Recommendations: Neurology recommends 30 day cardiac event monitor to rule out Afib given possible embolic stroke.  Please consider PFTs to evaluate for COPD Follow up transition from aspirin and plavix to just aspirin after completion of 3 weeks. Recommend collecting CBC on follow up due to anemia

## 2023-01-07 NOTE — ED Notes (Signed)
Patient given graham crackers, applesauce, and cranberry juice.

## 2023-01-07 NOTE — ED Provider Notes (Signed)
Patient had some slurred speech at approximately 645 this morning.  He was evaluated by Dr. Pilar Plate who decided to get a CT scan of his head.  The CT scan shows an acute stroke.  I spoke to neurology Dr. Otelia Limes and he requested the patient to be medically admitted with neurology consult.  Patient needs to have a stroke workup.  I spoke to the patient and examined him and he does not have any slurred speech or any neurodeficits on exam.   Bethann Berkshire, MD 01/07/23 [phone]

## 2023-01-07 NOTE — ED Notes (Signed)
Pt transported to CT via stretcher.  

## 2023-01-07 NOTE — Progress Notes (Addendum)
Brief Interval Note:   Spoke on the phone with [name], [phone], pt's POA. Patient lives independently. At [employer]. Patient been having behavioral issues at the residence, walking around naked and leaving things in the micro that led to smoking in the device. Has also been smoking cigarettes chronically. Patient handles medications independently and she was planning on reconciling medications. Most recent medication list on hand, includes:   ASA 81 mg  Depakote 500 mg 2 tabs at bedtime  Aricept 5 mg at bedtime  Lexapro 5 mg daily  Keppra 750 2 tabs BID  Melatonin 5 mg daily  Protonix 40 mg daily  Requip 1 mg at bedtime  Crestor 10 mg at bedtime  Trazodone 150 mg at bedtime  Vitamin D supplementation   Not completely sure of patients medication regimen and will have to check tomorrow in the office. Has previously been on Ambien for prolonged periods of time and believes he's abused it, along with his trazodone. Was discontinued by provider and patient was finding alternative means to buy Ambien pills ( through a civilian). Unsure of the last time he took sleep aids.  Asked to bring any documentation, she has for further med reconciliation. She may attempt to come and see tomorrow. Best time to call her is after 9 AM.

## 2023-01-07 NOTE — Progress Notes (Signed)
CSW contacted [employer] [phone]) . Patients placement social worker with [facility] social services. Ms. Elijah Birk stated she has been searching for placement for patient but hasn't found anybody to take patient. Ms. Elijah Birk plans on following up with patients POA and [facility] today to find out what the long term care plan will be.    CSW contacted patients [facility] social worker Delila Pereyra [phone], ext [phone]. CSW left a message requesting a call back.

## 2023-01-07 NOTE — ED Notes (Signed)
Patient ambulated to the bathroom.

## 2023-01-07 NOTE — Consult Note (Signed)
Neurology Consultation  Reason for Consult: Stroke on CT head  Referring Physician: Dr. Estell Harpin  CC: Chest pain /loss of consciousness   History is obtained from:medical record and patient   HPI: Kerry [name] is a 77 y.o. male with past medical history of anxiety, bipolar, dementia, seizures (on Depakote and Keppra), pancreatitic cancer, and sleep apnea who presented to [facility] ED on 8/16 for chest pain and loss of consciousness. Per chart review patient developed acute onset of slurred speech around 0645 this am. CT head was obtained and revealed Acute infarct in the right MCA territory involving the insula and dense M2/M3 branch on the right, which could represent thrombus. He states his slurred speech this morning lasted about 10 minutes, denies any weakness, or vision changes. Neurology consulted for stroke workup    LKW: unclear IV thrombolysis given?: no, outside window  EVT:  No LVO reason Premorbid modified Rankin scale (mRS):  0-Completely asymptomatic and back to baseline post-stroke  ROS: Full ROS was performed and is negative except as noted in the HPI.   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 He has never used smokeless tobacco. He reports that he does not drink alcohol and does not use drugs.  Medications  Current Facility-Administered Medications:    divalproex (DEPAKOTE ER) 24 hr tablet 1,000 mg, 1,000 mg, Oral, QHS, Sabas Sous, MD   levETIRAcetam (KEPPRA) tablet 1,500 mg, 1,500 mg, Oral, BID, Sabas Sous, MD, 1,500 mg at 01/07/23 0749   melatonin tablet 5 mg, 5 mg, Oral, QHS, Countryman, Chase, MD, 5 mg at 01/06/23 2043   nicotine (NICODERM CQ - dosed in mg/24  hours) patch 14 mg, 14 mg, Transdermal, Daily, Gowens, Mariah L, PA-C, 14 mg at 01/07/23 0944   QUEtiapine (SEROQUEL) tablet 100 mg, 100 mg, Oral, QHS, Countryman, Chase, MD, 100 mg at 01/06/23 2041  Current Outpatient Medications:    aspirin EC 81 MG tablet, Take 1 tablet (81 mg total) by mouth daily., Disp: 30 tablet, Rfl: 12   cephALEXin (KEFLEX) 500 MG capsule, Take 1 capsule (500 mg total) by mouth 2 (two) times daily for 7 days., Disp: 14 capsule, Rfl: 0   Cholecalciferol (VITAMIN D) 50 MCG (2000 UT) tablet, Take 2,000 Units by mouth at bedtime., Disp: , Rfl:    clotrimazole (LOTRIMIN) 1 % external solution, Apply 1 application  topically daily as needed (For dystrophic nails)., Disp: , Rfl:    divalproex (DEPAKOTE ER) 500 MG 24 hr tablet, Take 2 tablets (1,000 mg total) by mouth at bedtime., Disp: 60 tablet, Rfl: 0   donepezil (ARICEPT) 5 MG tablet, Take 5 mg by mouth every morning. For memory, Disp: , Rfl:    Emollient (CETAPHIL) cream, Apply 1 application. topically daily., Disp: , Rfl:    escitalopram (LEXAPRO) 10 MG tablet, Take 5 mg by mouth daily. For mental health, Disp: , Rfl:    ferrous gluconate (FERGON) 324 MG tablet, Take 324 mg by mouth daily. Take with Vitamin C/ascorbic acid, Disp: , Rfl:    finasteride (PROSCAR) 5 MG tablet, Take  5 mg by mouth daily. For overactive bladder, Disp: , Rfl:    levETIRAcetam (KEPPRA) 750 MG tablet, Take 2 tablets (1,500 mg total) by mouth 2 (two) times daily., Disp: 60 tablet, Rfl: 0   melatonin 3 MG TABS tablet, Take 3 mg by mouth at bedtime., Disp: , Rfl:    nicotine (NICODERM CQ - DOSED IN MG/24 HOURS) 14 mg/24hr patch, Place 14 mg onto the skin daily., Disp: , Rfl:    pantoprazole (PROTONIX) 40 MG tablet, Take 1 tablet (40 mg total) by mouth daily., Disp: 30 tablet, Rfl: 1   potassium chloride (KLOR-CON) 10 MEQ tablet, Take 1 tablet (10 mEq total) by mouth daily for 5 days., Disp: 5 tablet, Rfl: 0   QUEtiapine (SEROQUEL) 100 MG tablet, Take  100 mg by mouth at bedtime. For mood and sleep, Disp: , Rfl:    rOPINIRole (REQUIP) 1 MG tablet, Take 1 mg by mouth at bedtime., Disp: , Rfl:    rosuvastatin (CRESTOR) 10 MG tablet, Take 0.5 tablets (5 mg total) by mouth at bedtime. For cholesterol, Disp: , Rfl:    traZODone (DESYREL) 150 MG tablet, Take 0.5 tablets (75 mg total) by mouth at bedtime as needed for up to 10 days for sleep., Disp: 5 tablet, Rfl: 0   vitamin C (ASCORBIC ACID) 500 MG tablet, Take 500 mg by mouth daily., Disp: , Rfl:    Exam: Current vital signs: BP 100/80 (BP Location: Left Arm)   Pulse 86   Temp 98.6 F (37 C) (Oral)   Resp 20   SpO2 100%  Vital signs in last 24 hours: Temp:  [98.6 F (37 C)] 98.6 F (37 C) (08/18 1901) Pulse Rate:  [70-86] 86 (08/19 0618) Resp:  [20] 20 (08/19 0618) BP: (100-102)/(61-80) 100/80 (08/19 0618) SpO2:  [99 %-100 %] 100 % (08/19 0618)  GENERAL: Awake, alert in NAD HEENT: - Normocephalic and atraumatic, dry mm LUNGS - Clear to auscultation bilaterally with no wheezes CV - S1S2 RRR, no m/r/g, equal pulses bilaterally. ABDOMEN - Soft, nontender, nondistended with normoactive BS Ext: warm, well perfused, intact peripheral pulses, no edema  NEURO:  Mental Status: AA&Ox3  Language: speech is mild dysarthria.  Naming, repetition, fluency, and comprehension intact. Cranial Nerves: PERRL EOMI, visual fields full, left facial asymmetry, facial sensation decreased on left, hearing intact, tongue/uvula/soft palate midline, normal sternocleidomastoid and trapezius muscle strength. No evidence of tongue atrophy  Motor: 5/5 in all 4 extremities Tone: is normal and bulk is normal Sensation- Intact to light touch bilaterally Coordination: FTN intact bilaterally, no ataxia in BLE. Gait- Deferred  NIHSS 1a Level of Conscious.: 0 1b LOC Questions: 1 1c LOC Commands: 0 2 Best Gaze: 0 3 Visual: 0 4 Facial Palsy: 1 5a Motor Arm - left: 0 5b Motor Arm - Right: 0 6a Motor Leg - Left:  0 6b Motor Leg - Right: 0 7 Limb Ataxia: 0 8 Sensory: 1 9 Best Language: 0 10 Dysarthria: 1 11 Extinct. and Inatten.: 0 TOTAL: 4   Labs I have reviewed labs in epic and the results pertinent to this consultation are:  CBC    Component Value Date/Time   WBC 4.0 01/04/2023 1728   RBC 3.26 (L) 01/04/2023 1728   HGB 10.1 (L) 01/04/2023 1728   HCT 30.6 (L) 01/04/2023 1728   PLT 80 (L) 01/04/2023 1728   MCV 93.9 01/04/2023 1728   MCH 31.0 01/04/2023 1728   MCHC 33.0 01/04/2023 1728   RDW 14.0 01/04/2023 1728  LYMPHSABS 1.1 01/04/2023 1728   MONOABS 0.6 01/04/2023 1728   EOSABS 0.1 01/04/2023 1728   BASOSABS 0.0 01/04/2023 1728    CMP     Component Value Date/Time   NA 137 01/04/2023 1728   K 3.2 (L) 01/04/2023 1728   CL 104 01/04/2023 1728   CO2 23 01/04/2023 1728   GLUCOSE 91 01/04/2023 1728   BUN 16 01/04/2023 1728   CREATININE 0.76 01/04/2023 1728   CALCIUM 9.0 01/04/2023 1728   PROT 6.1 (L) 01/04/2023 1728   ALBUMIN 3.1 (L) 01/04/2023 1728   AST 24 01/04/2023 1728   ALT 16 01/04/2023 1728   ALKPHOS 62 01/04/2023 1728   BILITOT 0.4 01/04/2023 1728   GFRNONAA >60 01/04/2023 1728    Lipid Panel  No results found for: "CHOL", "TRIG", "HDL", "CHOLHDL", "VLDL", "LDLCALC", "LDLDIRECT"  Lab Results  Component Value Date   HGBA1C 4.8 12/08/2022      Imaging I have reviewed the images obtained:  CT-head- Acute infarct in the right MCA territory involving the insula,  Dense M2/M3 branch on the right, which could represent thrombus.   MRI examination of the brain pending  Prior CTA of head and neck (12/17/22): Patent vasculature of the head and neck with mild calcified plaque at the carotid bifurcations and intracranial ICAs but no hemodynamically significant stenosis, occlusion, or dissection.  Labs:  Hgb 4.8  Assessment: 77 y.o. male with past medical history of anxiety, bipolar, dementia, seizures, pancreatitic cancer, and sleep apnea who presented to [facility]  ED on 8/16 for chest pain and loss of consciousness. Per chart review patient developed acute onset of slurred speech around 0645 this morning - Exam reveals mild left facial weakness, left sided sensory loss involving the face, mild dysarthria and mild language deficit. - CT head: Acute infarct in the right MCA territory involving the insula,  Dense M2/M3 branch on the right, which could represent thrombus.  - Out of the TNK time window. - Not a thrombectomy candidate as symptoms are too mild to treat. . - Overall impression: Acute right MCA ischemic infarct  - Stroke risk factors: Sleep apnea - Classifiable as having failed ASA monotherapy  Recommendations: - Admit for stroke workup  - Fasting lipid panel - Frequent neuro checks - Echocardiogram - CTA head and neck - MRI brain - Prophylactic therapy-Antiplatelet med: Aspirin - dose 81mg  and plavix 75mg  daily (adding Plavix to ASA).  - Starting rosuvastatin. Obtain baseline CK level - Risk factor modification - Telemetry monitoring - PT consult, OT consult, Speech consult - Permissive HTN x 24 hours. Given advanced age, would use modified parameters. Treat BP if SBP is > 180. After 24 hours, gradually normalize BP to 120-140/80-90 - Stroke team to follow in the morning  Addendum: CTA of head and neck: No intracranial large vessel occlusion or significant stenosis. Mild stenosis in the right supraclinoid ICA. No hemodynamically significant stenosis in the neck. Aortic atherosclerosis.  Gevena Mart DNP, ACNPC-AG  [facility]  I have seen and examined the patient. I have formulated the assessment and recommendations. 77 y.o. male with past medical history of anxiety, bipolar, dementia, seizures, pancreatitic cancer, and sleep apnea who presented to [facility] ED on 8/16 for chest pain and loss of consciousness. Per chart review patient developed acute onset of slurred speech around 0645 this morning. Exam reveals mild left facial  weakness, left sided sensory loss involving the face, mild dysarthria and mild language deficit. CT head reveals an acute infarct in the right  MCA territory involving the insula; there is a dense M2/M3 branch on the right, which could represent thrombus. Out of the TNK time window. Not a thrombectomy candidate as symptoms are too mild to treat. Recommendations as above.  Electronically signed: Dr. Caryl Pina

## 2023-01-07 NOTE — ED Notes (Signed)
Pt transported to MRI via WC.

## 2023-01-07 NOTE — ED Notes (Signed)
Patient ambulatory to rr with steady gait using walker

## 2023-01-07 NOTE — H&P (Cosign Needed Addendum)
Hospital Admission History and Physical Service Pager: [phone]  Patient name: Kerry [name] Medical record number: [id number] Date of Birth: 1945/09/02 Age: 77 y.o. Gender: male  Primary Care Provider: Clinic, Kerry [name] Consultants: Neurology  Code Status: Full Code  Preferred Emergency Contact: Kerry [name] [phone]  Chief Complaint: Slurred speech   Assessment and Plan: Kerry [name] is a 77 y.o. male presenting with slurred speech and personal concern for mini stroke. CT Head concerning with R MCA infarct. Neurology consulted and following for evaluation. MRI and CTA ordered. Differential for presentation of this includes TIA vs stroke. 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 No evidence of endocarditis or DVT with PFO in the past. Patient is hypercoagulable given current pancreatic cancer. Currently stable. Patient has history of dark stools concerning for GI bleed, but no proven GI bleeds. Thus, good candidate for antiplatelet therapy.    Assessment & Plan Dysarthria CT concerning for acute MCA infarct. No focal deficits on exam.  - Admit to FMTS, Med-tele,Attending Dr. McDiarmid  - Neurology consulted, appreciate recommendations  - F/u MRI, CTA  - Started aspirin 81 and plavix  - Continue home Crestor  - Neuro checks q 4 hours  - PT/ OT treat and Eval   Chronic and Stable   Dementia - Continue aricept, requip  Bipolar 1: Continue seroquel Epilepsy - Continue depakote 1000 mg daily, keppra 1500 BID Tobacco use disorder - Nicotine patch 21 mcg    FEN/GI: Currently NPO  VTE Prophylaxis: Lovenox   Disposition: Med- Tele   History of Present Illness:  Kerry [name] is a 77 y.o. male presenting with shortness of breath and slurred speech   Patient said he is worried about a mini stroke. Says every two to three days he has episodes where his eyes flutter, he feels dizzy and weak, and his speech starts to stutter. He feels weak all  over all the time. Says he needs to go to exercise. He says he notices he is more sloppy. Denies falling or LOC or head trauma. Denies headaches or pain.  Denies fever, or recent illness. Says he has had a major stroke in the past. He is unsure of when it happened. History of pancreatic cancer s/p whipple procedure in February 2024. Currently getting treatments with Kerry [name] and is due for his cancer treatment per patient. Denies history of GIB, or hemoptysis.   ED provider noticed slurring of speech. Patient is otherwise hemodynamically stable and mentating fine. Slurred speech resolved in the ED as noticed by ED physician before seen by FMTS team. CT Head positive for acute infarct in the R MCA territory. Neurology consulted and ordered MRI and CT Angio Head   Review Of Systems: Per HPI with the following additions: constipation   Pertinent Past Medical History: Pancreatic Cancer 2024  [facility] Kerry [name])  Seizure disorder Anxiety  Bipolar  Remainder reviewed in history tab.   Pertinent Past Surgical History: Whipple Feb 2024 (Hx of Pancreatic Cancer)  ERCP June 2023   Remainder reviewed in history tab.   Pertinent Social History: Tobacco use: Yes, 1-2 PPD, would smoke "every 20 mins" if he could  Alcohol use: No  Other Substance use: No  Lives in ALF. Says he is not allowed to live there anymore. Says he forgot to take something out of the microwave and made the microwave smoke.   Pertinent Family History: Father heart attack at 65 yo  Mom heart attack 40 Brother stomach cancer  Remainder reviewed in history tab.   Important Outpatient Medications: Adjuvant gemcitabine Abraxane  Keppra  750 BID daily Divalproex 500 mg 2 tabs  Donepezil 5 mg  Ascorbic acid 500 mg daily  Ferrous gluconate  Escitalopram 5 mg daily  Quetiapine 100 mg at bedtime  Ropinirole 1 mg at bedtime  Trazodone 75 mg at bedtime  Zolpidem 5 mg at bedtime  Finasteride 5 mg daily  ASA 81   Awaiting  pharmacy tech reconciliation, consider calling ALF  Remainder reviewed in medication history.   Objective: BP 100/80 (BP Location: Left Arm)   Pulse 86   Temp 98.6 F (37 C) (Oral)   Resp 20   SpO2 100%   Exam: General: No acute distress, sitting on side of bed  Neck: Normal range of motion Cardiovascular: RRR, no murmurs appreciated Respiratory: CTAB, NWOB on room air  Gastrointestinal: Soft, tender to palpation in the epigastric region,  MSK: Gross motor strength intact, 5/5 Upper Extremity and Lower extremity  Neuro: Alert and oriented x 4; though sometimes a little disorganized or with poor memory, Cranial nerves II-XII intact, no evidence of bells palsy, Some repetitive blinking/squinting  Psych: Appropriate Behavior, Conversational   Labs:  CBC BMET  Recent Labs  Lab 01/04/23 1728  WBC 4.0  HGB 10.1*  HCT 30.6*  PLT 80*   Recent Labs  Lab 01/04/23 1728  NA 137  K 3.2*  CL 104  CO2 23  BUN 16  CREATININE 0.76  GLUCOSE 91  CALCIUM 9.0    Pertinent additional labs   Pending labs:  UDS CMP CBC w/ diff  EtOh Istat chem 8 .   EKG: pending     Imaging Studies Performed:  CT Head w/o contrast:   IMPRESSION: 1. 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 No hemorrhage. 2. Dense M2/M3 branch on the right, which could represent thrombus.  CT Angio pending  MRI wo contrast pending   Kerry Ao, MD 01/07/2023, 3:20 PM PGY-1, [facility]  FPTS Intern pager: [phone], text pages welcome Secure chat group [facility] Teaching Service   Upper Level Attestation I have seen and examined the patient with the resident Dr. Birdena [name]. I agree with the history, physical, and assessment above with any necessary edits.   Kerry Mola, MD  Family Medicine Teaching Service

## 2023-01-07 NOTE — ED Notes (Signed)
ED TO INPATIENT HANDOFF REPORT  ED Nurse Name and Phone #: [phone]  S Name/Age/Gender Kerry [name] 77 y.o. male Room/Bed: 041C/041C  Code Status   Code Status: Full Code  Home/SNF/Other Skilled nursing facility Patient oriented to: self, place, time, and situation Is this baseline? Yes   Triage Complete: Triage complete  Chief Complaint Stroke [facility]) [I63.9]  Triage Note Pt arrives to the er from independent living facility [facility]. Per ems, pt had an episode of syncope that lasted around 15 seconds. Pt did not hit his head, no pain per pt from syncope. Pt does state his chest is hurting and has been hurting for around a week. VSS per ems. Ems also mentioned there is an active investigation with APS for this pt at the facility, so there may be a need to have pt sent elsewhere. APS was apparently at the facility when this syncope episode occurred.    Allergies Allergies  Allergen Reactions   Fluoride Other (See Comments)    Seizures and stroke after dental visit from using fluoride paste (per pt)     Level of Care/Admitting Diagnosis ED Disposition     ED Disposition  Admit   Condition  --   Comment  Hospital Area: [facility] [[id number]]  Level of Care: Telemetry Medical [104]  May admit patient to [name] or [facility] if equivalent level of care is available:: No  Covid Evaluation: Asymptomatic - no recent exposure (last 10 days) testing not required  Diagnosis: Stroke [facility]) [[id number]]  Admitting Physician: Peterson Ao [[id number]]  Attending Physician: Acquanetta Belling D [[id number]]  Certification:: I certify this patient will need inpatient services for at least 2 midnights  Expected Medical Readiness: 01/09/2023          B Medical/Surgery History Past Medical History:  Diagnosis Date   anxiety    Anxiety    Bipolar 1 disorder (HCC)    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   Dementia (HCC)    Seizures (HCC)    Sleep  apnea    Past Surgical History:  Procedure Laterality Date   BILIARY BRUSHING  10/23/2021   Procedure: BILIARY BRUSHING;  Surgeon: Lemar Lofty., MD;  Location: [facility] ENDOSCOPY;  Service: Gastroenterology;;   BILIARY STENT PLACEMENT  10/23/2021   Procedure: BILIARY STENT PLACEMENT;  Surgeon: Lemar Lofty., MD;  Location: [facility] ENDOSCOPY;  Service: Gastroenterology;;   BIOPSY  10/23/2021   Procedure: BIOPSY;  Surgeon: Lemar Lofty., MD;  Location: [facility] ENDOSCOPY;  Service: Gastroenterology;;   ERCP N/A 10/23/2021   Procedure: ENDOSCOPIC RETROGRADE CHOLANGIOPANCREATOGRAPHY (ERCP);  Surgeon: Lemar Lofty., MD;  Location: [facility] ENDOSCOPY;  Service: Gastroenterology;  Laterality: N/A;   ESOPHAGOGASTRODUODENOSCOPY (EGD) WITH PROPOFOL N/A 10/23/2021   Procedure: ESOPHAGOGASTRODUODENOSCOPY (EGD) WITH PROPOFOL;  Surgeon: Meridee Score Netty Starring., MD;  Location: [facility] ENDOSCOPY;  Service: Gastroenterology;  Laterality: N/A;   FINE NEEDLE ASPIRATION  10/23/2021   Procedure: FINE NEEDLE ASPIRATION (FNA) LINEAR;  Surgeon: Lemar Lofty., MD;  Location: [facility] ENDOSCOPY;  Service: Gastroenterology;;   REMOVAL OF STONES  10/23/2021   Procedure: REMOVAL OF SLUDGE;  Surgeon: Lemar Lofty., MD;  Location: [facility] ENDOSCOPY;  Service: Gastroenterology;;   [name]  10/23/2021   Procedure: [name];  Surgeon: Lemar Lofty., MD;  Location: [facility] ENDOSCOPY;  Service: Gastroenterology;;   UPPER ESOPHAGEAL ENDOSCOPIC ULTRASOUND (EUS) N/A 10/23/2021   Procedure: UPPER ESOPHAGEAL ENDOSCOPIC ULTRASOUND (EUS);  Surgeon: Meridee Score Netty Starring., MD;  Location: [facility] ENDOSCOPY;  Service: Gastroenterology;  Laterality: N/A;     A IV Location/Drains/Wounds Patient Lines/Drains/Airways Status     Active Line/Drains/Airways     Name Placement date Placement time Site Days   Implanted Port 12/08/22 Right Chest 12/08/22  0100  Chest  30   GI Stent 10/23/21  1353  --  441             Intake/Output Last 24 hours No intake or output data in the 24 hours ending 01/07/23 1942  Labs/Imaging No results found for this or any previous visit (from the past 48 hour(s)). MR BRAIN WO CONTRAST  Result Date: 01/07/2023 CLINICAL DATA:  Altered mental status, stroke suspected EXAM: MRI HEAD WITHOUT CONTRAST TECHNIQUE: Multiplanar, multiecho pulse sequences of the brain and surrounding structures were obtained without intravenous contrast. COMPARISON:  12/18/2022 MRI head, correlation is also made with 01/07/2023 CT head FINDINGS: Brain: Restricted diffusion with ADC correlate in the right MCA territory, primarily involving the insula (series 2, images 23-30) and right temporoparietal cortex (series 2, images 26-36.) Additional restricted diffusion with ADC correlate in the medial right frontal cortex (series 2, image 39). These areas are associated with mildly increased T2 hyperintense signal and gyral swelling. No evidence of associated hemorrhage. No mass, mass effect, or midline shift. No hydrocephalus or extra-axial collection. No hemosiderin deposition to suggest remote hemorrhage. Vascular: Normal arterial flow voids. Skull and upper cervical spine: Normal marrow signal. Sinuses/Orbits: Clear paranasal sinuses. No acute finding in the orbits. Status post bilateral lens replacements. Other: The mastoid air cells are well aerated. IMPRESSION: Acute infarcts involving the right insula, temporoparietal cortex, and medial frontal cortex. No evidence of hemorrhage. These results were called by telephone at the time of interpretation on 01/07/2023 at 7:16 pm to provider Pollie Meyer , who verbally acknowledged these results. Electronically Signed   By: Wiliam Ke M.D.   On: 01/07/2023 19:17   CT HEAD WO CONTRAST ( )  Result Date: 01/07/2023 CLINICAL DATA:  Seizure disorder, clinical change EXAM: CT HEAD WITHOUT CONTRAST TECHNIQUE: Contiguous axial images were obtained from the base of the skull  through the vertex without intravenous contrast. RADIATION DOSE REDUCTION: This exam was performed according to the departmental dose-optimization program which includes automated exposure control, adjustment of the mA and/or kV according to patient size and/or use of iterative reconstruction technique. COMPARISON:  CT Head 12/24/22, MR head 12/17/22 FINDINGS: Brain: There is an acute infarcts in the right MCA territory involving the insula. No hemorrhage. No hydrocephalus. No extra-axial fluid collection. Sequela of mild chronic microvascular ischemic change. Vascular: Dense MCA on the right, in the region of the M2/M3 segments. Skull: Normal. Negative for fracture or focal lesion. Dermal calcifications. Sinuses/Orbits: No middle ear or mastoid effusion. Paranasal sinuses are clear. Bilateral lens replacement. Orbits are otherwise unremarkable. Other: None. IMPRESSION: 1. 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 No hemorrhage. 2. Dense M2/M3 branch on the right, which could represent thrombus. Electronically Signed   By: Lorenza Cambridge M.D.   On: 01/07/2023 14:15    Pending Labs Unresulted Labs (From admission, onward)     Start     Ordered   01/08/23 0500  Comprehensive metabolic panel  Tomorrow morning,   R        01/07/23 1641   01/08/23 0500  CBC  Tomorrow morning,   R        01/07/23 1641   01/07/23 1756  Lipid panel  Once,   R  01/07/23 1755   01/07/23 1438  Ethanol  Once,   URGENT        01/07/23 1438   01/07/23 1438  Protime-INR  (Stroke Panel (PNL))  ONCE - STAT,   STAT        01/07/23 1438   01/07/23 1438  APTT  (Stroke Panel (PNL))  ONCE - STAT,   STAT        01/07/23 1438   01/07/23 1438  CBC  (Stroke Panel (PNL))  ONCE - STAT,   STAT        01/07/23 1438   01/07/23 1438  Differential  (Stroke Panel (PNL))  ONCE - STAT,   URGENT        01/07/23 1438   01/07/23 1438  Comprehensive metabolic panel  (Stroke Panel (PNL))  ONCE - STAT,   STAT        01/07/23  1438   01/07/23 1438  Urine rapid drug screen (hosp performed)  Once,   STAT        01/07/23 1438            Vitals/Pain Today's Vitals   01/07/23 0430 01/07/23 0534 01/07/23 0604 01/07/23 0618  BP:    100/80  Pulse:    86  Resp:    20  Temp:      TempSrc:      SpO2:    100%  PainSc: 0-No pain 0-No pain 0-No pain 0-No pain    Isolation Precautions No active isolations  Medications Medications  melatonin tablet 5 mg (5 mg Oral Given 01/06/23 2043)  QUEtiapine (SEROQUEL) tablet 100 mg (100 mg Oral Given 01/06/23 2041)  nicotine (NICODERM CQ - dosed in mg/24 hours) patch 14 mg (14 mg Transdermal Patch Removed 01/07/23 0945)  divalproex (DEPAKOTE ER) 24 hr tablet 1,000 mg (has no administration in time range)  levETIRAcetam (KEPPRA) tablet 1,500 mg (1,500 mg Oral Given 01/07/23 0749)  rosuvastatin (CRESTOR) tablet 5 mg (has no administration in time range)  enoxaparin (LOVENOX) injection 40 mg (has no administration in time range)  sodium chloride flush (NS) 0.9 % injection 10-40 mL (has no administration in time range)  sodium chloride flush (NS) 0.9 % injection 10-40 mL (has no administration in time range)  Chlorhexidine Gluconate Cloth 2 % PADS 6 each (has no administration in time range)  donepezil (ARICEPT) tablet 5 mg (has no administration in time range)  rOPINIRole (REQUIP) tablet 1 mg (has no administration in time range)  aspirin EC tablet 81 mg (has no administration in time range)  clopidogrel (PLAVIX) tablet 75 mg (has no administration in time range)  potassium chloride SA (KLOR-CON M) CR tablet 40 mEq (40 mEq Oral Given 01/04/23 2253)  LORazepam (ATIVAN) tablet 0.5 mg (0.5 mg Oral Given 01/04/23 2253)  potassium chloride (KLOR-CON) packet 40 mEq (40 mEq Oral Given 01/07/23 1435)  iohexol (OMNIPAQUE) 350 MG/ML injection 75 mL (75 mLs Intravenous Contrast Given 01/07/23 1912)    Mobility walks with device     Focused Assessments Neuro Assessment  Handoff:  Swallow screen pass? Yes          Neuro Assessment:   Neuro Checks:      Has TPA been given? No If patient is a Neuro Trauma and patient is going to OR before floor call report to 4N Charge nurse: [phone] or [phone]   R Recommendations: See Admitting Provider Note  Report given to:   Additional Notes: admit for stroke /care taker [phone]/ resides at [facility] walker/ a/o x 4/ vs wnl  last NIH 3 port accessed has upcoming chemo appt

## 2023-01-07 NOTE — ED Notes (Signed)
ED Provider at bedside. 

## 2023-01-07 NOTE — ED Provider Notes (Signed)
Recent CT read as showing stroke. Dr Estell Harpin has discussed with neurology, who indicates they will consult, and recommends medical admission. Dr Estell Harpin to discuss w med admitting service.    Cathren Laine, MD 01/07/23 1434

## 2023-01-07 NOTE — Progress Notes (Signed)
FMTS Brief Progress Note  S: Rounded on patient shortly after his arrival on 3W. He is ambulatory and friendly. Intermittently became fixated on "going outside to smoke a cigarette" and eloped his room into the hallway. Walked the hallways with staff escort before being redirected by nursing staff. Raised his voice intermittently but did not become violent.  Upon returning to the room requests a laxative. Can not give a clear timeline of when his last BM was, but says he just tried to go and "it didn't work."  O: BP (!) 115/56   Pulse 65   Temp 98.6 F (37 C) (Oral)   Resp 20   SpO2 99%   Speech remains mildly dysarthric but intelligible. Intermittent agitation without violence as above.  He ambulates with an age appropriate gait and moves all extremities well.   A/P: Acute Stroke  Admitted for inpatient workup.  MR brain with acute infarcts involving the right insula, temporoparietal cortex, and medial frontal cortex without evidence of hemorrhage. CTA Head/Neck with no intracranial large vessel occlusion or significant stenosis. Mild stenosis to the right supraclinoid ICA. - ASA + Plavix - Awaiting Echocardiogram - PT/OT/SLP eval - LE DVT studies ordered by Dr. Roda Shutters - Permissive HTN x24 hrs  - Passed [facility] Swallow Screen, diet ordered   Agitation  Dementia  Delirium Received his bedtime Seroquel, depakote, and melatonin around 2230.  - Many thanks to nursing staff for their skillful redirection of this patient - Delirium precautions ordered - May need sitter if he continues to try to elope - Haldol 2mg  PRN for agitation if he cannot be redirected. Please let us know if this needs to be administered.    - Orders reviewed. Labs for AM ordered, which was adjusted as needed.   Alicia Amel, MD 01/07/2023, 9:37 PM PGY-3, [facility] Night Resident  Please page [phone] with questions.

## 2023-01-08 ENCOUNTER — Encounter (HOSPITAL_COMMUNITY): Payer: Self-pay

## 2023-01-08 ENCOUNTER — Other Ambulatory Visit: Payer: Self-pay

## 2023-01-08 ENCOUNTER — Inpatient Hospital Stay (HOSPITAL_COMMUNITY): Payer: No Typology Code available for payment source

## 2023-01-08 DIAGNOSIS — R410 Disorientation, unspecified: Secondary | ICD-10-CM | POA: Diagnosis not present

## 2023-01-08 DIAGNOSIS — R634 Abnormal weight loss: Secondary | ICD-10-CM

## 2023-01-08 DIAGNOSIS — I639 Cerebral infarction, unspecified: Secondary | ICD-10-CM | POA: Diagnosis not present

## 2023-01-08 DIAGNOSIS — F03B18 Unspecified dementia, moderate, with other behavioral disturbance: Secondary | ICD-10-CM | POA: Diagnosis present

## 2023-01-08 DIAGNOSIS — Z658 Other specified problems related to psychosocial circumstances: Secondary | ICD-10-CM

## 2023-01-08 DIAGNOSIS — F03918 Unspecified dementia, unspecified severity, with other behavioral disturbance: Secondary | ICD-10-CM

## 2023-01-08 DIAGNOSIS — F17200 Nicotine dependence, unspecified, uncomplicated: Secondary | ICD-10-CM | POA: Diagnosis present

## 2023-01-08 DIAGNOSIS — F03B0 Unspecified dementia, moderate, without behavioral disturbance, psychotic disturbance, mood disturbance, and anxiety: Secondary | ICD-10-CM | POA: Diagnosis present

## 2023-01-08 DIAGNOSIS — D61818 Other pancytopenia: Secondary | ICD-10-CM | POA: Diagnosis present

## 2023-01-08 DIAGNOSIS — I6340 Cerebral infarction due to embolism of unspecified cerebral artery: Secondary | ICD-10-CM | POA: Diagnosis not present

## 2023-01-08 LAB — CBC
HCT: 27.5 % — ABNORMAL LOW (ref 39.0–52.0)
Hemoglobin: 9 g/dL — ABNORMAL LOW (ref 13.0–17.0)
MCH: 29.4 pg (ref 26.0–34.0)
MCHC: 32.7 g/dL (ref 30.0–36.0)
MCV: 89.9 fL (ref 80.0–100.0)
Platelets: 70 10*3/uL — ABNORMAL LOW (ref 150–400)
RBC: 3.06 MIL/uL — ABNORMAL LOW (ref 4.22–5.81)
RDW: 14.1 % (ref 11.5–15.5)
WBC: 3.2 10*3/uL — ABNORMAL LOW (ref 4.0–10.5)
nRBC: 0 % (ref 0.0–0.2)

## 2023-01-08 LAB — COMPREHENSIVE METABOLIC PANEL
ALT: 19 U/L (ref 0–44)
AST: 27 U/L (ref 15–41)
Albumin: 2.9 g/dL — ABNORMAL LOW (ref 3.5–5.0)
Alkaline Phosphatase: 59 U/L (ref 38–126)
Anion gap: 10 (ref 5–15)
BUN: 13 mg/dL (ref 8–23)
CO2: 25 mmol/L (ref 22–32)
Calcium: 8.6 mg/dL — ABNORMAL LOW (ref 8.9–10.3)
Chloride: 100 mmol/L (ref 98–111)
Creatinine, Ser: 0.63 mg/dL (ref 0.61–1.24)
GFR, Estimated: >60 mL/min (ref >60)
Glucose, Bld: 103 mg/dL — ABNORMAL HIGH (ref 70–99)
Potassium: 3 mmol/L — ABNORMAL LOW (ref 3.5–5.1)
Sodium: 135 mmol/L (ref 135–145)
Total Bilirubin: 0.5 mg/dL (ref 0.3–1.2)
Total Protein: 5.7 g/dL — ABNORMAL LOW (ref 6.5–8.1)

## 2023-01-08 LAB — IRON AND TIBC
Iron: 26 ug/dL — ABNORMAL LOW (ref 45–182)
Saturation Ratios: 11 % — ABNORMAL LOW (ref 17.9–39.5)
TIBC: 232 ug/dL — ABNORMAL LOW (ref 250–450)
UIBC: 206 ug/dL

## 2023-01-08 LAB — POCT I-STAT, CHEM 8
BUN: 15 mg/dL (ref 8–23)
Calcium, Ion: 1.06 mmol/L — ABNORMAL LOW (ref 1.15–1.40)
Chloride: 103 mmol/L (ref 98–111)
Creatinine, Ser: 0.6 mg/dL — ABNORMAL LOW (ref 0.61–1.24)
Glucose, Bld: 86 mg/dL (ref 70–99)
HCT: 31 % — ABNORMAL LOW (ref 39.0–52.0)
Hemoglobin: 10.5 g/dL — ABNORMAL LOW (ref 13.0–17.0)
Potassium: 3.5 mmol/L (ref 3.5–5.1)
Sodium: 138 mmol/L (ref 135–145)
TCO2: 25 mmol/L (ref 22–32)

## 2023-01-08 MED ORDER — ROSUVASTATIN CALCIUM 20 MG PO TABS: 20.0000 mg | ORAL_TABLET | Freq: Every day | ORAL | Status: DC

## 2023-01-08 MED ORDER — NICOTINE POLACRILEX 2 MG MT GUM
2.0000 mg | CHEWING_GUM | OROMUCOSAL | Status: DC | PRN
  Administered 2023-01-11: 2 mg via ORAL
  Filled 2023-01-08 (×2): qty 1

## 2023-01-08 MED ORDER — POTASSIUM CHLORIDE CRYS ER 10 MEQ PO TBCR
40.0000 meq | EXTENDED_RELEASE_TABLET | ORAL | Status: AC
  Administered 2023-01-08 (×2): 40 meq via ORAL
  Filled 2023-01-08 (×2): qty 4

## 2023-01-08 MED ORDER — ROSUVASTATIN CALCIUM 5 MG PO TABS
5.0000 mg | ORAL_TABLET | Freq: Every day | ORAL | Status: DC
  Administered 2023-01-08 – 2023-01-11 (×4): 5 mg via ORAL
  Filled 2023-01-08 (×4): qty 1

## 2023-01-08 MED ORDER — POTASSIUM CHLORIDE 20 MEQ PO PACK
40.0000 meq | PACK | ORAL | Status: DC
  Filled 2023-01-08: qty 2

## 2023-01-08 MED ORDER — UMECLIDINIUM BROMIDE 62.5 MCG/ACT IN AEPB
1.0000 | INHALATION_SPRAY | Freq: Every day | RESPIRATORY_TRACT | Status: DC
  Administered 2023-01-08 – 2023-01-12 (×4): 1 via RESPIRATORY_TRACT
  Filled 2023-01-08: qty 7

## 2023-01-08 NOTE — Progress Notes (Signed)
Physical Therapy Treatment Patient Details Name: Kerry [name] MRN: [id number] DOB: June 03, 1945 Today's Date: 01/08/2023   History of Present Illness Pt is a 77 y.o. male who presented 01/04/23 with chest pain and brief syncopal episode. EKG is without evidence of acute ischemia.  Troponins are without elevation or significant delta. PMH: dementia, Anxiety, Bipolar d/o, OSA , Seizure d/o  pancreatic cancer stage III on Chemo    PT Comments  Patient resting in recliner, RN present and pt wanting to go to the bathroom to void bladder. Patient required supervision for sit<>stand from recliner and CGA/sup for safety with gait to ambulate to bathroom then sink for hand hygiene. Pt then requested to don shoes, seated rest provided in recliner and pt required cues for sequencing donning shoes by bring foot to contralateral knee. Mod assist required for donning shoes fully on bil LE. Pt hen amb ~200' in hallway with CGA/sup for safety and frequent redirection provided to remain on unit. EOS pt educated on repeat sit<>stands for LE strengthening and agreeable to remain in recliner. Alarm on and call bell within reach.    If plan is discharge home, recommend the following: Direct supervision/assist for medications management;Direct supervision/assist for financial management;Assist for transportation;Help with stairs or ramp for entrance;Supervision due to cognitive status   Can travel by private vehicle        Equipment Recommendations  None recommended by PT    Recommendations for Other Services       Precautions / Restrictions Precautions Precautions: Fall Restrictions Weight Bearing Restrictions: No     Mobility  Bed Mobility               General bed mobility comments: in recliner on entry    Transfers Overall transfer level: Needs assistance Equipment used: None Transfers: Sit to/from Stand Sit to Stand: Supervision           General transfer comment: sup for power up  from recliner with no AD, requested walk but gait continued without AD.    Ambulation/Gait Ambulation/Gait assistance: Contact guard assist, Supervision Gait Distance (Feet): 200 Feet Assistive device: 1 person hand held assist, None Gait Pattern/deviations: Step-through pattern, Decreased stride length, Trunk flexed, Drifts right/left Gait velocity: decr     General Gait Details: CGA for gait with Rt HHA at start and progressed to no UE support, no AD, and CGA/sup for safety. Pt required frequent cues to remain on unit, easily redirected with task of greeting each person in hallway and "cheering them up".   Stairs             Wheelchair Mobility     Tilt Bed    Modified Rankin (Stroke Patients Only)       Balance Overall balance assessment: Mild deficits observed, not formally tested                                          Cognition Arousal: Alert Behavior During Therapy: Restless, Impulsive Overall Cognitive Status: History of cognitive impairments - at baseline                                 General Comments: A&Ox3 - decreased insight into how long he has been here. aware of memory deficits (did i wash my hands yet). perseverating on going outside- became quickly agiated when  told not going outside but easily redirected        Exercises      General Comments        Pertinent Vitals/Pain Pain Assessment Pain Assessment: No/denies pain    Home Living     Available Help at Discharge: Personal care attendant;Other (Comment) (for meds) Type of Home: Apartment                  Prior Function            PT Goals (current goals can now be found in the care plan section) Acute Rehab PT Goals Patient Stated Goal: to get HHPT PT Goal Formulation: With patient Time For Goal Achievement: 01/19/23 Potential to Achieve Goals: Good Progress towards PT goals: Progressing toward goals    Frequency    Min  1X/week      PT Plan      Co-evaluation              AM-PAC PT "6 Clicks" Mobility   Outcome Measure  Help needed turning from your back to your side while in a flat bed without using bedrails?: None Help needed moving from lying on your back to sitting on the side of a flat bed without using bedrails?: None Help needed moving to and from a bed to a chair (including a wheelchair)?: A Little Help needed standing up from a chair using your arms (e.g., wheelchair or bedside chair)?: A Little Help needed to walk in hospital room?: A Little Help needed climbing 3-5 steps with a railing? : A Little 6 Click Score: 20    End of Session Equipment Utilized During Treatment: Gait belt Activity Tolerance: Patient tolerated treatment well Patient left: in bed;with call bell/phone within reach;with bed alarm set Nurse Communication: Mobility status PT Visit Diagnosis: Unsteadiness on feet (R26.81);Other abnormalities of gait and mobility (R26.89);History of falling (Z91.81)     Time: 1140-1159 PT Time Calculation (min) (ACUTE ONLY): 19 min  Charges:    $Gait Training: 8-22 mins PT General Charges $$ ACUTE PT VISIT: 1 Visit                     Wynn Maudlin, DPT Acute Rehabilitation Services Office [phone]  01/08/23 1:23 PM

## 2023-01-08 NOTE — Assessment & Plan Note (Addendum)
-   130 pack year history.

## 2023-01-08 NOTE — Progress Notes (Addendum)
STROKE TEAM PROGRESS NOTE   BRIEF HPI Mr. Kerry [name] is a 77 y.o. male with history of anxiety, bipolar, dementia, seizures (on Depakote and Keppra), pancreatitic cancer, and sleep apnea who presented to [facility] ED on 8/16 for chest pain and loss of consciousness. Per chart review patient developed acute onset of slurred speech around 0645 this am. CT head was obtained and revealed Acute infarct in the right MCA territory involving the insula and dense M2/M3 branch on the right, which could represent thrombus. He states his slurred speech this morning lasted about 10 minutes, denies any weakness, or vision changes.   SIGNIFICANT HOSPITAL EVENTS   INTERIM HISTORY/SUBJECTIVE Sitting up in the chair eating food.  Dapt x3 weeks and then ASA 81mg  alone. Neurologically stable, no focal deficits on exam.   OBJECTIVE  CBC    Component Value Date/Time   WBC 3.2 (L) 01/08/2023 0043   RBC 3.06 (L) 01/08/2023 0043   HGB 9.0 (L) 01/08/2023 0043   HCT 27.5 (L) 01/08/2023 0043   PLT 70 (L) 01/08/2023 0043   MCV 89.9 01/08/2023 0043   MCH 29.4 01/08/2023 0043   MCHC 32.7 01/08/2023 0043   RDW 14.1 01/08/2023 0043   LYMPHSABS 1.1 01/07/2023 2019   MONOABS 0.5 01/07/2023 2019   EOSABS 0.1 01/07/2023 2019   BASOSABS 0.0 01/07/2023 2019    BMET    Component Value Date/Time   NA 135 01/08/2023 0043   K 3.0 (L) 01/08/2023 0043   CL 100 01/08/2023 0043   CO2 25 01/08/2023 0043   GLUCOSE 103 (H) 01/08/2023 0043   BUN 13 01/08/2023 0043   CREATININE 0.63 01/08/2023 0043   CALCIUM 8.6 (L) 01/08/2023 0043   GFRNONAA >60 01/08/2023 0043    IMAGING past 24 hours CT ANGIO HEAD NECK W WO CM  Result Date: 01/07/2023 CLINICAL DATA:  Syncopal episode, stroke suspected; acute infarcts on MRI EXAM: CT ANGIOGRAPHY HEAD AND NECK WITH AND WITHOUT CONTRAST TECHNIQUE: Multidetector CT imaging of the head and neck was performed using the standard protocol during bolus administration of intravenous contrast.  Multiplanar CT image reconstructions and MIPs were obtained to evaluate the vascular anatomy. Carotid stenosis measurements (when applicable) are obtained utilizing NASCET criteria, using the distal internal carotid diameter as the denominator. RADIATION DOSE REDUCTION: This exam was performed according to the departmental dose-optimization program which includes automated exposure control, adjustment of the mA and/or kV according to patient size and/or use of iterative reconstruction technique. CONTRAST:  75mL OMNIPAQUE IOHEXOL 350 MG/ML SOLN COMPARISON:  12/17/2022 CTA head and neck; 01/07/2023 CT head and MRI head FINDINGS: CT HEAD FINDINGS For noncontrast findings, please see same day CT head. CTA NECK FINDINGS Aortic arch: Standard branching. Imaged portion shows no evidence of aneurysm or dissection. No significant stenosis of the major arch vessel origins. Mild aortic atherosclerosis, with noncalcified plaque extending to the origin of brachiocephalic artery Right carotid system: No evidence of dissection, occlusion, or hemodynamically significant stenosis (greater than 50%). Left carotid system: No evidence of dissection, occlusion, or hemodynamically significant stenosis (greater than 50%). Vertebral arteries: No evidence of dissection, occlusion, or hemodynamically significant stenosis (greater than 50%). Skeleton: No acute osseous abnormality. Degenerative changes in the cervical spine. Other neck: No acute finding. Upper chest: No focal pulmonary opacity or pleural effusion. Emphysema. Right chest port. Review of the MIP images confirms the above findings CTA HEAD FINDINGS Anterior circulation: Both internal carotid arteries are patent to the termini, with mild stenosis in the right supraclinoid ICA (  series 7, image 124). A1 segments patent. Normal anterior communicating artery. Anterior cerebral arteries are patent to their distal aspects without significant stenosis. No M1 stenosis or occlusion. MCA  branches perfused to their distal aspects without significant stenosis. Posterior circulation: Vertebral arteries patent to the vertebrobasilar junction without significant stenosis. Posterior inferior cerebellar arteries patent on the left. Basilar patent to its distal aspect without significant stenosis. Superior cerebellar arteries patent proximally. Patent P1 segments. PCAs perfused to their distal aspects without significant stenosis. There is likely a diminutive left posterior communicating artery. Venous sinuses: As permitted by contrast timing, patent. Anatomic variants: None significant. Review of the MIP images confirms the above findings IMPRESSION: 1. No intracranial large vessel occlusion or significant stenosis. Mild stenosis in the right supraclinoid ICA. 2. No hemodynamically significant stenosis in the neck. 3. Aortic atherosclerosis. Aortic Atherosclerosis (ICD10-I70.0). Electronically Signed   By: Wiliam Ke M.D.   On: 01/07/2023 20:27   MR BRAIN WO CONTRAST  Result Date: 01/07/2023 CLINICAL DATA:  Altered mental status, stroke suspected EXAM: MRI HEAD WITHOUT CONTRAST TECHNIQUE: Multiplanar, multiecho pulse sequences of the brain and surrounding structures were obtained without intravenous contrast. COMPARISON:  12/18/2022 MRI head, correlation is also made with 01/07/2023 CT head FINDINGS: Brain: Restricted diffusion with ADC correlate in the right MCA territory, primarily involving the insula (series 2, images 23-30) and right temporoparietal cortex (series 2, images 26-36.) Additional restricted diffusion with ADC correlate in the medial right frontal cortex (series 2, image 39). These areas are associated with mildly increased T2 hyperintense signal and gyral swelling. No evidence of associated hemorrhage. No mass, mass effect, or midline shift. No hydrocephalus or extra-axial collection. No hemosiderin deposition to suggest remote hemorrhage. Vascular: Normal arterial flow voids.  Skull and upper cervical spine: Normal marrow signal. Sinuses/Orbits: Clear paranasal sinuses. No acute finding in the orbits. Status post bilateral lens replacements. Other: The mastoid air cells are well aerated. IMPRESSION: Acute infarcts involving the right insula, temporoparietal cortex, and medial frontal cortex. No evidence of hemorrhage. These results were called by telephone at the time of interpretation on 01/07/2023 at 7:16 pm to provider Pollie Meyer , who verbally acknowledged these results. Electronically Signed   By: Wiliam Ke M.D.   On: 01/07/2023 19:17   CT HEAD WO CONTRAST ( )  Result Date: 01/07/2023 CLINICAL DATA:  Seizure disorder, clinical change EXAM: CT HEAD WITHOUT CONTRAST TECHNIQUE: Contiguous axial images were obtained from the base of the skull through the vertex without intravenous contrast. RADIATION DOSE REDUCTION: This exam was performed according to the departmental dose-optimization program which includes automated exposure control, adjustment of the mA and/or kV according to patient size and/or use of iterative reconstruction technique. COMPARISON:  CT Head 12/24/22, MR head 12/17/22 FINDINGS: Brain: There is an acute infarcts in the right MCA territory involving the insula. No hemorrhage. No hydrocephalus. No extra-axial fluid collection. Sequela of mild chronic microvascular ischemic change. Vascular: Dense MCA on the right, in the region of the M2/M3 segments. Skull: Normal. Negative for fracture or focal lesion. Dermal calcifications. Sinuses/Orbits: No middle ear or mastoid effusion. Paranasal sinuses are clear. Bilateral lens replacement. Orbits are otherwise unremarkable. Other: None. IMPRESSION: 1. 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 No hemorrhage. 2. Dense M2/M3 branch on the right, which could represent thrombus. Electronically Signed   By: Lorenza Cambridge M.D.   On: 01/07/2023 14:15    Vitals:   01/07/23 1845 01/07/23 2015 01/07/23 2035  01/07/23 2240  BP: 133/73 Marland Kitchen)  115/56  117/61  Pulse: 67 65  75  Resp: 16   16  Temp:   98.6 F (37 C) 98.4 F (36.9 C)  TempSrc:   Oral Oral  SpO2: 100% 99%  100%     PHYSICAL EXAM General:  Alert, well-nourished, well-developed patient in no acute distress Psych:  Mood and affect appropriate for situation CV: Regular rate and rhythm on monitor Respiratory:  Regular, unlabored respirations on room air GI: Abdomen soft and nontender   NEURO:  Mental Status: AA&Ox3, able to tell me he had a stroke, fixated on wanting to leave and check into a hotel Speech/Language: speech is without dysarthria or aphasia.  Naming, repetition, fluency, and comprehension intact.  Cranial Nerves:  II: PERRL. Visual fields full.  III, IV, VI: EOMI. Eyelids elevate symmetrically.  V: Sensation is intact to light touch and symmetrical to face.  VII: Face is symmetrical resting and smiling VIII: hearing intact to voice. IX, X: Palate elevates symmetrically. Phonation is normal.  GN:FAOZHYQM shrug 5/5. XII: tongue is midline without fasciculations. Motor: 5/5 strength to all muscle groups tested.  Tone: is normal and bulk is normal Sensation- Intact to light touch bilaterally. Extinction absent to light touch to DSS.   Coordination: FTN intact bilaterally, HKS: no ataxia in BLE.No drift.  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 No hemorrhage. Dense M2/M3 branch on the right, which could represent thrombus. CT Angio Head and Neck- No intracranial large vessel occlusion or significant stenosis. Mild stenosis in the right supraclinoid ICA. Left ICA bulb athro MRI  - Acute infarcts involving the right insula, temporoparietal cortex, and medial frontal  cortex 2D Echo EF 60-65%, grade 1 diastolic dysfunction LE venous doppler negative for DVT Recommend 30 day cardiac event [facility] to rule out afib LDL 55 HgbA1c 4.8 UDS neg VTE prophylaxis - lovenox No antithrombotic prior to admission, now on aspirin 81 mg daily and clopidogrel 75 mg daily for 3 weeks and then ASA 81mg  alone. Therapy recommendations:  No follow up needed Disposition:  pending  Hypertension Stable Long term Blood Pressure Goal normotensive  Hyperlipidemia Home meds:  Crestor 5 mg, resumed in hospital LDL 55, goal < 70 No high intensity statin due to LDL at goal Continue statin at discharge  Tobacco Abuse Patient smokes, per him, he smokes small amount Pt is willing to quit Nicotine replacement therapy provided  Other Stroke Risk Factors ETOH use, alcohol level <10, advised to drink no more than 2 drink(s) a day OSA  Other Active Problems Hypokalemia- K 3.0 Anxiety, bipolar disorder, on Wellbutrin, lexapro, seroquel, requip, remeron Dementia with behavior disturbance Seizures, 10/2021 admitted for breakthrough seizure, now on depakote, keppra  Anemia Pancreatic cancer - Follows with Myrtis Ser, NP and Dr. 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 He is currently receiving treatment with the goal of cure with Abraxane/gemcitabine. He has been tolerating treatment well overall. Restaging CT scan of the chest/abdomen/pelvis was performed on 12/06/2022 which showed postsurgical changes of the Whipple procedure, no evidence of metastatic disease. Same day MRI brain with and  without contrast showed no brain metastasis.   Hospital day # 1  Patient seen and examined by NP/APP with MD. MD to update note as needed.   Elmer Picker, DNP,  FNP-BC [facility] Pager: [phone]  ATTENDING NOTE: I reviewed above note and agree with the assessment and plan. Pt was seen and examined.   No family at the bedside. Pt sitting in bed, asking for to be discharged to hotel. He apparently impulsive, mildly agitated, but AAO x 3, mild dysarthria per pt, no aphasia, no focal neuro deficit. Moving all extremities. Pt stroke embolic pattern, likely cardioembolic source, but cannot rule out hypercoagulable state from his newly diagnosed pancreatic carcinoma (07/2022) s/p Wipple surgery and now in chemo, recent pan CT and brain MRI did not show metastasis though. Stroke work up so far negative. Will recommend 30 day cardiac [facility] to rule out [facility]ib. Continue DAPT for 3 weeks and than ASA alone. Continue statin and keppra and depakote home AEDs. Smoking cessation provided. PT OT no recs, but pt is pending disposition.   For detailed assessment and plan, please refer to above/below as I have made changes wherever appropriate.   Neurology will sign off. Please call with questions. Pt will follow up with stroke clinic NP at [facility] in about 4 weeks. Thanks for the consult.   Marvel Plan, MD PhD Stroke Neurology 01/08/2023 5:26 PM     To contact Stroke Continuity provider, please refer to [email or web address]. After hours, contact General Neurology

## 2023-01-08 NOTE — Assessment & Plan Note (Addendum)
  MR brain with acute infarcts involving the right insula, temporoparietal cortex, and medial frontal cortex without evidence of hemorrhage. CTA Head/Neck with no intracranial large vessel occlusion or significant stenosis. No neurologic deficits.  - Neurology following, appreciate recommendations  - ASA + Plavix, continued home statin ( 5 mg Crestor) - Awaiting Echocardiogram and DVT ultrasound  - Neuro checks q 4 hours  - Permissive HTN x 4-6 days, unless otherwise recommended by Neurology  - PT/OT/SLP evaluation

## 2023-01-08 NOTE — Progress Notes (Signed)
Bilateral lower extremity venous study completed.   Preliminary results relayed to MD and RN.  Please see CV Procedures for preliminary results.  Kemiyah Tarazon, RVT  1:43 PM 01/08/23

## 2023-01-08 NOTE — Evaluation (Signed)
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 01/08/2023   No contraindication to deep vein thrombosis (DVT) prophylaxis 01/07/2023   Stroke (HCC) 01/07/2023   Aggressive behavior 11/22/2021   Status epilepticus (HCC) 11/11/2021   Postictal confusion 11/11/2021   Altered mental status 11/02/2021   Generalized weakness    Jaundice    Dark stools 10/24/2021   Serum ammonia increased (HCC) 10/22/2021   Mixed hyperlipidemia 10/21/2021   Restless leg syndrome 10/21/2021   BPH (benign prostatic hyperplasia) 10/21/2021   Seizure disorder (HCC) 10/21/2021   Common bile duct obstruction    Pancreatic lesion    Weight loss    Cholestatic jaundice 10/20/2021   Bipolar I disorder (HCC) 10/20/2021   Hypokalemia 10/20/2021   Lesion of left native kidney 10/20/2021   Past Medical History:  Past Medical History:  Diagnosis Date   anxiety    Anxiety    Bipolar 1 disorder (HCC)    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   Dementia (HCC)    Seizures (HCC)    Sleep apnea    Past Surgical History:  Past Surgical History:  Procedure Laterality Date   BILIARY BRUSHING  10/23/2021   Procedure: BILIARY BRUSHING;  Surgeon: Lemar Lofty., MD;  Location: [facility] ENDOSCOPY;  Service: Gastroenterology;;   BILIARY STENT PLACEMENT  10/23/2021   Procedure: BILIARY STENT PLACEMENT;  Surgeon: Lemar Lofty., MD;  Location: [facility] ENDOSCOPY;  Service: Gastroenterology;;   BIOPSY  10/23/2021   Procedure: BIOPSY;  Surgeon: Lemar Lofty., MD;  Location: [facility] ENDOSCOPY;  Service: Gastroenterology;;   ERCP N/A 10/23/2021   Procedure: ENDOSCOPIC RETROGRADE CHOLANGIOPANCREATOGRAPHY (ERCP);  Surgeon: Lemar Lofty., MD;  Location: [facility] ENDOSCOPY;  Service: Gastroenterology;  Laterality: N/A;   ESOPHAGOGASTRODUODENOSCOPY (EGD) WITH PROPOFOL N/A 10/23/2021   Procedure: ESOPHAGOGASTRODUODENOSCOPY (EGD) WITH PROPOFOL;  Surgeon: Meridee Score Netty Starring., MD;  Location: [facility] ENDOSCOPY;  Service: Gastroenterology;  Laterality: N/A;   FINE NEEDLE ASPIRATION  10/23/2021   Procedure: FINE NEEDLE ASPIRATION (FNA) LINEAR;  Surgeon: Lemar Lofty., MD;  Location: [facility] ENDOSCOPY;  Service: Gastroenterology;;   REMOVAL OF STONES  10/23/2021   Procedure: REMOVAL OF SLUDGE;  Surgeon: Lemar Lofty., MD;  Location: [facility] ENDOSCOPY;  Service: Gastroenterology;;   [name]  10/23/2021   Procedure: [name];  Surgeon: Lemar Lofty., MD;  Location[facility] ENDOSCOPY;  Service: Gastroenterology;;   UPPER ESOPHAGEAL ENDOSCOPIC ULTRASOUND (EUS) N/A 10/23/2021   Procedure: UPPER ESOPHAGEAL ENDOSCOPIC ULTRASOUND (EUS);  Surgeon: Lemar Lofty., MD;  Location: [facility] ENDOSCOPY;  Service: Gastroenterology;  Laterality: N/A;   HPI:  Pt is a 77 y.o. male who presented 01/04/23 with chest pain and brief syncopal episode. EKG is without evidence of acute ischemia.  Troponins are without elevation or significant delta. PMH: dementia, Anxiety, Bipolar d/o, OSA , Seizure d/o  pancreatic cancer stage III on Chemo   Assessment / Plan / Recommendation Clinical Impression  Pt seen for cognitive-linguistic evaluation. Evaluation completed via informal means and portions of Cognistat. Pt with cognitive-linguistic deficits affecting attention, memory, problem solving, abstract reasoning, executive functioning, and insight. Based on chart review, suspect pt is near cognitive-linguistic baseline. Speech is fluent, but perseverative at times - pt asking about going outside to smoke a  cigarette. Pt presents with s/sx mild dysarthria c/b articulatory imprecision. Pt aware of changes to speech and benefited from verbal cueing  for slow and loud speech to improve speech intelligibility. Recommend trial of skilled SLP services acute/post-acute for dysarthria. Concern for pt's ability to carry-over speech intelligibility strategies given cognitive-linguistic impairment. Antcipate benefit from assistance with IADLs at d/c based on today's assessment.    SLP Assessment  SLP Recommendation/Assessment: Patient needs continued Speech Lanaguage Pathology Services SLP Visit Diagnosis: Dysarthria and anarthria (R47.1);Cognitive communication deficit (R41.841)    Recommendations for follow up therapy are one component of a multi-disciplinary discharge planning process, led by the attending physician.  Recommendations may be updated based on patient status, additional functional criteria and insurance authorization.    Follow Up Recommendations   (in alignment with PT/OT recommendations)    Assistance Recommended at Discharge  Frequent or constant Supervision/Assistance  Functional Status Assessment Patient has had a recent decline in their functional status and demonstrates the ability to make significant improvements in function in a reasonable and predictable amount of time.  Frequency and Duration min 2x/week  2 weeks      SLP Evaluation Cognition  Overall Cognitive Status: History of cognitive impairments - at baseline Orientation Level: Oriented to person;Oriented to place;Oriented to time;Oriented to situation Attention: Sustained Sustained Attention: Impaired Memory: Impaired Memory Impairment: Storage deficit;Retrieval deficit;Decreased recall of new information;Decreased short term memory Awareness: Impaired Awareness Impairment: Emergent impairment;Anticipatory impairment Problem Solving: Impaired Problem Solving Impairment: Functional basic Executive Function: Reasoning;Sequencing;Self Monitoring;Self Correcting Reasoning: Impaired Sequencing: Impaired Self Monitoring: Impaired Self Correcting:  Impaired Behaviors: Restless;Impulsive;Perseveration Safety/Judgment: Impaired       Comprehension  Auditory Comprehension Overall Auditory Comprehension: Appears within functional limits for tasks assessed    Expression Expression Primary Mode of Expression: Verbal Verbal Expression Overall Verbal Expression: Appears within functional limits for tasks assessed Initiation: No impairment Automatic Speech: Name;Social Response Level of Generative/Spontaneous Verbalization: Sentence;Conversation Repetition: No impairment Pragmatics: Impairment Impairments: Topic appropriateness   Oral / Motor  Oral Motor/Sensory Function Overall Oral Motor/Sensory Function: Mild impairment Facial ROM: Reduced right Facial Symmetry: Abnormal symmetry right Lingual ROM: Reduced right Motor Speech Overall Motor Speech: Impaired Respiration: Within functional limits Phonation: Normal Resonance: Within functional limits Articulation: Impaired Level of Impairment:  (all levels) Intelligibility: Intelligibility reduced Word: 75-100% accurate Phrase: 75-100% accurate Sentence: 75-100% accurate Conversation: 75-100% accurate Motor Planning: Witnin functional limits Motor Speech Errors: Not applicable           Clyde Canterbury, M.S., CCC-SLP Speech-Language Pathologist [facility] [phone] Arnette Felts)  Woodroe Chen 01/08/2023, 10:25 AM

## 2023-01-08 NOTE — Progress Notes (Addendum)
Daily Progress Note Intern Pager: [phone]  Patient name: Kerry [name] Medical record number: [id number] Date of birth: 03-11-1946 Age: 77 y.o. Gender: male  Primary Care Provider: [facility] Consultants: Neurology Code Status: Full  Pt Overview and Major Events to Date:  8/19 - admitted  Assessment and Plan:  Hiroshi Mcglathery is a 77 y.o. male presenting with slurred speech and personal concern for stroke. MRI and CTA revealed acute infarcts involving the right insula, temporoparietal cortex, and medial frontal cortex without evidence of hemorrhage. 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 Patient is hypercoagulable given current pancreatic cancer. Currently stable, no focal neurologic deficits. Does have episodes of AMS, most likely due to baseline dementia and bipolar 1.   Assessment & Plan Stroke [facility])  MR brain with acute infarcts involving the right insula, temporoparietal cortex, and medial frontal cortex without evidence of hemorrhage. CTA Head/Neck with no intracranial large vessel occlusion or significant stenosis. No neurologic deficits.  - Neurology following, appreciate recommendations  - ASA + Plavix, continued home statin ( 5 mg Crestor) - Awaiting Echocardiogram and DVT ultrasound  - Neuro checks q 4 hours  - Permissive HTN x 4-6 days, unless otherwise recommended by Neurology  - PT/OT/SLP evaluation  Delirium  - Complicated by baseline dementia and Bipolar 1 disorder. - Received his bedtime Seroquel, depakote, and melatonin around 2230.  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  - Ordered nicotine patch 21 mcg, patient declining - Order nicotine gum - Order Incruse Ellipta Inhaler   Chronic and Stable Issues: Dementia - Continue aricept, requip  Bipolar 1: Continue  seroquel Epilepsy - Continue depakote 1000 mg daily, keppra 1500 BID  FEN/GI: [employer] Screen, diet ordered VTE Prophylaxis: Lovenox Dispo:Was previously at ALF, Current dispo pending clinical improvement .   Subjective:  Rounded on patient while he was eating breakfast. He is ambulatory and friendly with no acute complaints. He reports restful sleep, although night team reports that he intermittently became agitated and fixated on "going outside to smoke a cigarette" and eloped his room into the hallway. He denies weakness, headache, palpitations, or difficulty breathing. He stated that he "felt great" and inquired as to when he could "go home."   Objective: Temp:  [98.4 F (36.9 C)-98.6 F (37 C)] 98.4 F (36.9 C) (08/19 2240) Pulse Rate:  [65-75] 75 (08/19 2240) Resp:  [16] 16 (08/19 2240) BP: (115-133)/(56-73) 117/61 (08/19 2240) SpO2:  [99 %-100 %] 100 % (08/19 2240)  Physical Exam: General: Awake, alert. Pleasant mood  Cardiovascular: Regular rate and rhythm, no murmurs, rubs, gallops. No lower extremity edema. 2+ pedal pulses bilaterally Respiratory: Lungs clear to auscultation bilaterally, mild wheezing in lower lobes Abdomen: Nontender, nondistended Extremities: 5/5 strength in upper and lower extremities. Warm, well perfused, intact peripheral pulses. No edema present.   Laboratory: Most recent CBC Lab Results  Component Value Date   WBC 3.2 (L) 01/08/2023   HGB 9.0 (L) 01/08/2023   HCT 27.5 (L) 01/08/2023   MCV 89.9 01/08/2023   PLT 70 (L) 01/08/2023   Most recent BMP    Latest Ref Rng & Units 01/08/2023   12:43 AM  BMP  Glucose 70 - 99 mg/dL 564   BUN 8 - 23 mg/dL 13   Creatinine 3.32 - 1.24 mg/dL 9.51   Sodium  135 - 145 mmol/L 135   Potassium 3.5 - 5.1 mmol/L 3.0   Chloride 98 - 111 mmol/L 100   CO2 22 - 32 mmol/L 25   Calcium 8.9 - 10.3 mg/dL 8.6    Continue to monitor hemoglobin (11.8 on 8/16 to 9.0 on 8/20). Order iron and hematocrit  studies.   Dayle Points, Medical Student 01/08/2023, 7:51 AM  Upper Level Attestation I have seen and examined the patient with the Medical student. I agree with the history, physical, and assessment above with any necessary edits.   Lockie Mola, MD  Family Medicine Teaching Service

## 2023-01-08 NOTE — Assessment & Plan Note (Addendum)
-   Complicated by baseline dementia and Bipolar 1 disorder. - Received his bedtime Seroquel, depakote, and melatonin around 2230.  - Haldol 2mg  for agitation given at 22:49 - Continue delirium protocol at night - Consider sitter if further elopement attempts

## 2023-01-08 NOTE — Evaluation (Signed)
Occupational Therapy Evaluation Patient Details Name: Kerry [name] MRN: [id number] DOB: [birth year] Today's Date: 01/08/2023   History of Present Illness Pt is a 77 y.o. male who presented 01/04/23 with chest pain and brief syncopal episode. EKG is without evidence of acute ischemia.  Troponins are without elevation or significant delta. PMH: dementia, Anxiety, Bipolar d/o, OSA , Seizure d/o  pancreatic cancer stage III on Chemo   Clinical Impression   PTA, pt from ILF, reports typically independent with ADLs, light IADLs and mobility. Pt presents now with deficits in cognition, safety awareness and endurance. Pt requesting RW for mobility, able to mobilize to bathroom and around unit without LOB. Pt requires no more than Supervision for ADL completion with limitations due to noted memory deficits. If pt is to DC back to ILF, would recommend consistent assist for IADLs such as meals and med mgmt based on cognition. Anticipate no OT follow up needed at DC.      If plan is discharge home, recommend the following: Assist for transportation;Assistance with cooking/housework;Direct supervision/assist for medications management;Direct supervision/assist for financial management    Functional Status Assessment  Patient has had a recent decline in their functional status and demonstrates the ability to make significant improvements in function in a reasonable and predictable amount of time.  Equipment Recommendations  None recommended by OT    Recommendations for Other Services       Precautions / Restrictions Precautions Precautions: Fall Restrictions Weight Bearing Restrictions: No      Mobility Bed Mobility               General bed mobility comments: in recliner on entry    Transfers Overall transfer level: Needs assistance Equipment used: None Transfers: Sit to/from Stand Sit to Stand: Supervision           General transfer comment: stood without AD then requested  RW for mobility to bathroom      Balance Overall balance assessment: Mild deficits observed, not formally tested                                         ADL either performed or assessed with clinical judgement   ADL Overall ADL's : Needs assistance/impaired Eating/Feeding: Independent   Grooming: Supervision/safety;Standing;Wash/dry face;Oral care Grooming Details (indicate cue type and reason): able to manage tasks without physical assist. asked if he had washed his hands yet because he had forgotten Upper Body Bathing: Supervision/ safety   Lower Body Bathing: Supervison/ safety   Upper Body Dressing : Supervision/safety   Lower Body Dressing: Supervision/safety   Toilet Transfer: Supervision/safety;Ambulation;Rolling walker (2 wheels)   Toileting- Clothing Manipulation and Hygiene: Supervision/safety;Sit to/from stand;Sitting/lateral lean       Functional mobility during ADLs: Supervision/safety;Rolling walker (2 wheels)       Vision Baseline Vision/History: 1 Wears glasses (reading) Ability to See in Adequate Light: 0 Adequate Patient Visual Report: No change from baseline Vision Assessment?: No apparent visual deficits;Wears glasses for reading     Perception         Praxis         Pertinent Vitals/Pain Pain Assessment Pain Assessment: No/denies pain     Extremity/Trunk Assessment Upper Extremity Assessment Upper Extremity Assessment: Overall WFL for tasks assessed   Lower Extremity Assessment Lower Extremity Assessment: Defer to PT evaluation   Cervical / Trunk Assessment Cervical / Trunk Assessment: Kyphotic   Communication  Communication Communication: No apparent difficulties   Cognition Arousal: Alert Behavior During Therapy: Restless, Impulsive Overall Cognitive Status: History of cognitive impairments - at baseline                                 General Comments: A&Ox3 - decreased insight into how long he  has been here. aware of memory deficits (did i wash my hands ye). perseverating on going outside for cigarette- became quickly agiated when nursing informed him he wouldnt go outside to smoke     General Comments       Exercises     Shoulder Instructions      Home Living Family/patient expects to be discharged to:: Other (Comment) (ILF) Living Arrangements: Alone Available Help at Discharge: Personal care attendant;Other (Comment) (for meds) Type of Home: Apartment Home Access: Elevator     Home Layout: One level               Home Equipment: Grab bars - toilet;Grab bars - tub/shower   Additional Comments: Walk in shower with grab bars. No shower seat. Toilet normal height.      Prior Functioning/Environment Prior Level of Function : Independent/Modified Independent             Mobility Comments: No AD, reports ~x1 fall/month ADLs Comments: Aide comes to manage meds in pill box for pt intermittently, walk to dining hall - reports needing to warm up meals (per chart, incident with warming food up with saran wrap intact causing smoking/fire hazard)        OT Problem List: Decreased activity tolerance;Decreased cognition;Decreased safety awareness      OT Treatment/Interventions: Therapeutic exercise;Self-care/ADL training;DME and/or AE instruction;Therapeutic activities    OT Goals(Current goals can be found in the care plan section) Acute Rehab OT Goals Patient Stated Goal: go outside and smoke OT Goal Formulation: With patient Time For Goal Achievement: 01/22/23 Potential to Achieve Goals: Good ADL Goals Pt/caregiver will Perform Home Exercise Program: Increased strength;Both right and left upper extremity;With theraband;With written HEP provided;With Supervision Additional ADL Goal #1: Pt to complete pill box test with 0 errors Additional ADL Goal #2: Pt to complete 3 step trail making task without verbal cues Additional ADL Goal #3: Pt to verbalize at  least 3 fall prevention strategies  OT Frequency: Min 1X/week    Co-evaluation              AM-PAC OT "6 Clicks" Daily Activity     Outcome Measure Help from another person eating meals?: None Help from another person taking care of personal grooming?: A Little Help from another person toileting, which includes using toliet, bedpan, or urinal?: A Little Help from another person bathing (including washing, rinsing, drying)?: A Little Help from another person to put on and taking off regular upper body clothing?: A Little Help from another person to put on and taking off regular lower body clothing?: A Little 6 Click Score: 19   End of Session Equipment Utilized During Treatment: Rolling walker (2 wheels)  Activity Tolerance: Patient tolerated treatment well Patient left: in chair;with chair alarm set;with call bell/phone within reach;with nursing/sitter in room  OT Visit Diagnosis: Muscle weakness (generalized) (M62.81);Other symptoms and signs involving cognitive function                Time: 0981-1914 OT Time Calculation (min): 19 min Charges:  OT General Charges $OT Visit: 1 Visit OT Evaluation $  OT Eval Low Complexity: 1 Low  Bradd Canary, OTR/L Acute Rehab Services Office: [phone]   Lorre Munroe 01/08/2023, 9:13 AM

## 2023-01-09 DIAGNOSIS — E43 Unspecified severe protein-calorie malnutrition: Secondary | ICD-10-CM | POA: Diagnosis not present

## 2023-01-09 DIAGNOSIS — I6340 Cerebral infarction due to embolism of unspecified cerebral artery: Secondary | ICD-10-CM

## 2023-01-09 DIAGNOSIS — F319 Bipolar disorder, unspecified: Secondary | ICD-10-CM | POA: Diagnosis not present

## 2023-01-09 DIAGNOSIS — F03918 Unspecified dementia, unspecified severity, with other behavioral disturbance: Secondary | ICD-10-CM | POA: Diagnosis not present

## 2023-01-09 LAB — BASIC METABOLIC PANEL
Anion gap: 9 (ref 5–15)
BUN: 8 mg/dL (ref 8–23)
CO2: 27 mmol/L (ref 22–32)
Calcium: 8.9 mg/dL (ref 8.9–10.3)
Chloride: 104 mmol/L (ref 98–111)
Creatinine, Ser: 0.71 mg/dL (ref 0.61–1.24)
GFR, Estimated: >60 mL/min (ref >60)
Glucose, Bld: 95 mg/dL (ref 70–99)
Potassium: 3.7 mmol/L (ref 3.5–5.1)
Sodium: 140 mmol/L (ref 135–145)

## 2023-01-09 LAB — FERRITIN: Ferritin: 136 ng/mL (ref 24–336)

## 2023-01-09 LAB — RETICULOCYTES
Immature Retic Fract: 2.5 % (ref 2.3–15.9)
RBC.: 3.09 MIL/uL — ABNORMAL LOW (ref 4.22–5.81)
Retic Count, Absolute: 29.4 10*3/uL (ref 19.0–186.0)
Retic Ct Pct: 1 % (ref 0.4–3.1)

## 2023-01-09 MED ORDER — POTASSIUM CHLORIDE CRYS ER 20 MEQ PO TBCR
40.0000 meq | EXTENDED_RELEASE_TABLET | Freq: Once | ORAL | Status: AC
  Administered 2023-01-09: 40 meq via ORAL
  Filled 2023-01-09: qty 2

## 2023-01-09 MED ORDER — MELATONIN 5 MG PO TABS
5.0000 mg | ORAL_TABLET | Freq: Every day | ORAL | Status: DC
  Administered 2023-01-09: 5 mg via ORAL
  Filled 2023-01-09: qty 1

## 2023-01-09 MED ORDER — ENSURE ENLIVE PO LIQD
237.0000 mL | Freq: Three times a day (TID) | ORAL | Status: DC
  Administered 2023-01-09 – 2023-01-12 (×8): 237 mL via ORAL

## 2023-01-09 MED ORDER — OLANZAPINE 5 MG PO TBDP
5.0000 mg | ORAL_TABLET | Freq: Three times a day (TID) | ORAL | Status: DC | PRN
  Administered 2023-01-11: 5 mg via ORAL
  Filled 2023-01-09 (×2): qty 1

## 2023-01-09 MED ORDER — LORAZEPAM 1 MG PO TABS
1.0000 mg | ORAL_TABLET | ORAL | Status: AC | PRN
  Administered 2023-01-11: 1 mg via ORAL
  Filled 2023-01-09: qty 1

## 2023-01-09 MED ORDER — LORAZEPAM 1 MG PO TABS: 1.0000 mg | ORAL_TABLET | Freq: Once | ORAL | Status: AC

## 2023-01-09 MED ORDER — MIRTAZAPINE 15 MG PO TABS: 15.0000 mg | ORAL_TABLET | Freq: Every day | ORAL | Status: DC

## 2023-01-09 MED ORDER — OLANZAPINE 10 MG IM SOLR: 2.5000 mg | INTRAMUSCULAR | Status: DC | PRN

## 2023-01-09 MED ORDER — ZIPRASIDONE MESYLATE 20 MG IM SOLR: 20.0000 mg | INTRAMUSCULAR | Status: DC | PRN

## 2023-01-09 MED ORDER — ADULT MULTIVITAMIN W/MINERALS CH
1.0000 | ORAL_TABLET | Freq: Every day | ORAL | Status: DC
  Administered 2023-01-10 – 2023-01-12 (×3): 1 via ORAL
  Filled 2023-01-09 (×4): qty 1

## 2023-01-09 MED ORDER — TRAZODONE HCL 50 MG PO TABS: 150.0000 mg | ORAL_TABLET | Freq: Every day | ORAL | Status: DC

## 2023-01-09 MED ORDER — OLANZAPINE 2.5 MG PO TABS
5.0000 mg | ORAL_TABLET | Freq: Every day | ORAL | Status: DC
  Administered 2023-01-09 – 2023-01-10 (×2): 5 mg via ORAL
  Filled 2023-01-09 (×2): qty 2

## 2023-01-09 MED ORDER — LORAZEPAM 2 MG/ML IJ SOLN
1.0000 mg | Freq: Once | INTRAMUSCULAR | Status: AC
  Administered 2023-01-09: 1 mg via INTRAVENOUS
  Filled 2023-01-09: qty 1

## 2023-01-09 MED ORDER — ZOLPIDEM TARTRATE 5 MG PO TABS: 2.5000 mg | ORAL_TABLET | Freq: Every day | ORAL | Status: DC

## 2023-01-09 MED ORDER — ZOLPIDEM TARTRATE 5 MG PO TABS
2.5000 mg | ORAL_TABLET | Freq: Every day | ORAL | Status: DC
  Administered 2023-01-09 – 2023-01-10 (×2): 2.5 mg via ORAL
  Filled 2023-01-09 (×2): qty 1

## 2023-01-09 NOTE — Assessment & Plan Note (Signed)
Chronic smoker 1-2 PPD history - Nicotine patch and nicorette ordered  - Patient refusing

## 2023-01-09 NOTE — Assessment & Plan Note (Signed)
Recent unsafe behavior at [facility] - they would accpt him back only if he had a sitter, leaving lit cigarettes. Agitated overnight, requiring security to intervene, haldol 2 mg x1 and ativan 1 mg given to calm patient down. Administers home medications independently ( mirtazapine 15 mg at bedtime, trazodone 150 mg at bedtime, bupropion 150 mg daily, escitalopram 10 mg Seroquel 100 mg (or 200 mg per POA), and patient self reported he was taking 20 mg ambien nightly but was obtaining illicitly from a civilian. Will consult psychiatry for recommendations, with medications and wether his agitation could be related to potential withdrawal off psych medications  - Scheduled 100 mg Seroquel, consider uptitrating - Re ordered home meds: trazodone 150 mg at bedtime, mirtazapine 15 mg at bedtime.  - Psych consult placed, appreciate recs  - Olanzapine 2.5mg  PRN for agitation if he cannot be redirected. Please let us know if this needs to be administered

## 2023-01-09 NOTE — Assessment & Plan Note (Signed)
-   Home ropinorole ordered

## 2023-01-09 NOTE — Progress Notes (Signed)
Daily Progress Note Intern Pager: [phone]  Patient name: Kerry [name] Medical record number: [id number] Date of birth: January 20, 1946 Age: 77 y.o. Gender: male  Primary Care Provider: [facility] Consultants: Neurology and Psychiatry  Code Status: Full Code   Pt Overview and Major Events to Date:  8/19 Admitted to FMTS  8/20 Increased night time agitation 8/21 Restarted home mirtazapine, trazodone and olanzapine added for agitation    Assessment and Plan: Kerry [name] is a 77 y.o. male admitted for sluring speech and found to have a stroke on CT/MRI. Neurology following and medications adjustments. Psych consult placed in the setting of increased agitation overnight, requiring haldol and ativan to calm down. Suspect night time agitation may be worsened from being off some of psych medications. Per conversation with Kerry [name], alternative contact, on Monday believes patient may have been abusing [place] and getting it off the street. Patient sleepier this morning after hadol and ativan last night. Will restart trazodone and mirtazapine tonight. If increased agitation overnight have olanzapine prn available if unable to redirect.   Assessment & Plan Stroke [facility]) Admitted for inpatient workup.  MR brain with acute infarcts involving the right insula, temporoparietal cortex, and medial frontal cortex without evidence of hemorrhage. CTA Head/Neck with no intracranial large vessel occlusion or significant stenosis. Mild stenosis to the right supraclinoid ICA. - Neuro recs: continue statin, ASA + Plavix for 3 weeks, can discontinue plavix afterwards and only continue on ASA   - Neuro suspect stroke is cardioembolic and recommend [facility] to [facility]/o [facility]-fib  - Continue home meds Depakote and Keppra h - PT recs HH PT, OT no need therapy at d/c, SLP: will work with acutely  - LE DVT: Negative for DVT Dementia with behavioral problem [facility]) Recent  unsafe behavior at [facility] - they would accpt him back only if he had a sitter, leaving lit cigarettes. Agitated overnight, requiring security to intervene, haldol 2 mg x1 and ativan 1 mg given to calm patient down. Administers home medications independently ( mirtazapine 15 mg at bedtime, trazodone 150 mg at bedtime, bupropion 150 mg daily, escitalopram 10 mg Seroquel 100 mg (or 200 mg per POA), and patient self reported he was taking 20 mg ambien nightly but was obtaining illicitly from a civilian. Will consult psychiatry for recommendations, with medications and wether his agitation could be related to potential withdrawal off psych medications  - Scheduled 100 mg Seroquel, consider uptitrating - Re ordered home meds: trazodone 150 mg at bedtime, mirtazapine 15 mg at bedtime.  - Psych consult placed, appreciate recs  - Olanzapine 2.5mg  PRN for agitation if he cannot be redirected. 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   Subjective:  Patient sleepier this morning after ativan and haldol given last night. Not sure why he wont take nicotine patch or nicorette gum. No pain or complaints.   Objective: Temp:  [97.6 F (36.4 C)-98.8 F (37.1 C)] 98.5 F (36.9 C) (08/21 1107)  Pulse Rate:  [66-78] 77 (08/21 1107) Resp:  [16-17] 16 (08/21 1107) BP: (97-106)/(53-59) 97/54 (08/21 1107) SpO2:  [98 %-100 %] 98 % (08/21 1107)  Physical Exam: General: Sleepy, responsive to voice, laying on bed   Cardiovascular: RRR, no murmurs  appreciated  Respiratory: CTAB  in the anterior fields Abdomen: Non tender, active bowel sounds Extremities: No lower extremity edema  Neuro: Sleepier and less alert after ativan and haldol overnight, responsive to requests,  Upper extremity strength intact and strong hand grip strength  Laboratory: Most recent CBC Lab Results  Component Value Date   WBC 3.2 (L) 01/08/2023   HGB 9.0 (L) 01/08/2023   HCT 27.5 (L) 01/08/2023   MCV 89.9 01/08/2023   PLT 70 (L) 01/08/2023   Most recent BMP    Latest Ref Rng & Units 01/08/2023   12:43 AM  BMP  Glucose 70 - 99 mg/dL 130   BUN 8 - 23 mg/dL 13   Creatinine 8.65 - 1.24 mg/dL 7.84   Sodium 696 - 295 mmol/L 135   Potassium 3.5 - 5.1 mmol/L 3.0   Chloride 98 - 111 mmol/L 100   CO2 22 - 32 mmol/L 25   Calcium 8.9 - 10.3 mg/dL 8.6     Other pertinent labs   Iron and TIBC low  Ferritin and reticulocytes pending    Imaging/Diagnostic Tests: CT Head w/o contrast:    IMPRESSION: 1. 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 No hemorrhage. 2. Dense M2/M3 branch on the right, which could represent thrombus.   CT Angio   IMPRESSION: 1. No intracranial large vessel occlusion or significant stenosis. Mild stenosis in the right supraclinoid ICA. 2. No hemodynamically significant stenosis in the neck. 3. Aortic atherosclerosis.  MRI wo contrast Acute infarcts involving the right insula, temporoparietal cortex, and medial frontal cortex. No evidence of hemorrhage.  Peterson Ao, MD 01/09/2023, 12:31 PM  PGY-1, [facility] Family Medicine FPTS Intern pager: [phone], text pages welcome Secure chat group [facility] Teaching Service

## 2023-01-09 NOTE — Progress Notes (Signed)
Pt highly agitated overnight requiring security to come speak with patient. Pt becomes aggressive verbally with threats of physical harm when told he cannot smoke a cigarette. He does not have any and states he will leave. Pt will answer orientation questions but is confused often. He is difficult to redirect at times when focused on smoking. Pt normally smokes 2 PPD. Pt is now on day 6 of no smoking. Pt refuses nicotine patches or gum. Pt administered 2mg  Haldol IV for agitation early in the night with minimal effectiveness. Pt became more agitated a few hours later. Pt administered 1mg  ativan IV @ 0147 and was calm and sleeping the rest of the night without agitation. Pt did request a Psych consult. (Pt did state he normally takes xanax often and 20 mg ambien at home to sleep.)

## 2023-01-09 NOTE — Progress Notes (Signed)
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 MVI with minerals daily Measure weight this admission.   NUTRITION DIAGNOSIS:   Severe Malnutrition related to chronic illness (cancer) as evidenced by severe muscle depletion, severe fat depletion.  GOAL:   Patient will meet greater than or equal to 90% of their needs  MONITOR:   PO intake, Supplement acceptance, Labs, Weight trends  REASON FOR ASSESSMENT:   Consult Assessment of nutrition requirement/status  ASSESSMENT:  Pt with hx of dementia, stage 3 pancreatic cancer s/p whipple, and tobacco abuse presented to ED with slurred speech. Imaging showed a R MCA.  Pt resting in bed napping at the time of assessment. Did not initially wake to name being called but did to gentle touch. Pt only answering nutrition questions with 1-2 words. States that he was eating fine prior to admission and that he used to weight 165, now ~130. Actively undergoing chemo for his pancreatic cancer. Unsure how many more cycles he has.   Pt's lunch arrived and assisted with getting his tray set up. Pt eating upon leaving the room. States that he does like ensure and was drinking 2-3 a day at home. Prefers strawberry.   Weight loss noted over the last year but not severe. Limited weight available since 2023.  Average Meal Intake: 8/20: 85% intake x 2 recorded meals  Nutritionally Relevant Medications: Scheduled Meds:  levETIRAcetam  1,500 mg Oral BID   mirtazapine  15 mg Oral QHS   polyethylene glycol  17 g Oral Daily   potassium chloride  40 mEq Oral Once   rosuvastatin  5 mg Oral QHS   Labs Reviewed  NUTRITION - FOCUSED PHYSICAL EXAM: Flowsheet Row Most Recent Value  Orbital Region Severe depletion  Upper Arm Region Severe depletion  Thoracic and Lumbar Region  Moderate depletion  Buccal Region Severe depletion  Temple Region Moderate depletion  Clavicle Bone Region Severe depletion  Clavicle and Acromion Bone Region Severe depletion  Scapular Bone Region Moderate depletion  Dorsal Hand Mild depletion  Patellar Region Unable to assess  Anterior Thigh Region Unable to assess  Posterior Calf Region Unable to assess  Edema (RD Assessment) None  Hair Reviewed  Eyes Reviewed  Mouth Reviewed  Skin Reviewed  Nails Reviewed    Diet Order:   Diet Order             Diet regular Room service appropriate? Yes; Fluid consistency: Thin  Diet effective now                   EDUCATION NEEDS:  Not appropriate for education at this time  Skin:  Skin Assessment: Reviewed RN Assessment  Last BM:  8/20 - type 5  Height:  Ht Readings from Last 1 Encounters:  01/09/23 5\' 6"  (1.676 m)    Weight:  Wt Readings from Last 1 Encounters:  12/18/22 62 kg    Ideal Body Weight:  64.5 kg  BMI:  Body mass index is 22.06 kg/m.  Estimated Nutritional Needs:  Kcal:  1750-2000 kcal/d Protein:  90-105g/d Fluid:  >/=1.8L/d    Greig Castilla, RD, LDN Clinical Dietitian RD pager # available in [facility]  After hours/weekend pager # available in [facility]

## 2023-01-09 NOTE — Progress Notes (Signed)
OT Cancellation Note  Patient Details Name: Kerry [name] MRN: [id number] DOB: 1945-11-25   Cancelled Treatment:    Reason Eval/Treat Not Completed: Fatigue/lethargy limiting ability to participate Pt given Ativan overnight, still lethargic. Pt briefly awakened to talk to OT but quickly went back to sleep. Unable to engage in OOB tasks at this time.   Lorre Munroe 01/09/2023, 12:17 PM

## 2023-01-09 NOTE — Assessment & Plan Note (Signed)
Patient has POA, who helps patient, can be reached on the phone after 9 AM - Kerry [name], [phone], pt's POA

## 2023-01-09 NOTE — Assessment & Plan Note (Signed)
Admitted for inpatient workup.  MR brain with acute infarcts involving the right insula, temporoparietal cortex, and medial frontal cortex without evidence of hemorrhage. CTA Head/Neck with no intracranial large vessel occlusion or significant stenosis. Mild stenosis to the right supraclinoid ICA. - Neuro recs: continue statin, ASA + Plavix for 3 weeks, can discontinue plavix afterwards and only continue on ASA   - Neuro suspect stroke is cardioembolic and recommend [facility] to [facility]/o A-fib  - Continue home meds Depakote and Keppra h - PT recs HH PT, OT no need therapy at d/c, SLP: will work with acutely  - LE DVT: Negative for DVT

## 2023-01-09 NOTE — Assessment & Plan Note (Signed)
K 3.0 this AM, lytes repleted  - AM BMP

## 2023-01-09 NOTE — Consult Note (Addendum)
[name] Consult Evaluation  Service Date: January 09, 2023 LOS:  LOS: 2 days    Primary Psychiatric Diagnoses  Bipolar 1 disorder Anxiety unspecified   Assessment  Brekin Asel is a 77 y.o. male admitted medically on 01/04/2023  5:16 PM for CVA. He carries the psychiatric diagnoses of bipolar 1 disorder and anxiety and has a past medical history of pancreatic cancer and seizure disorder. Psychiatry was consulted for Bipolar 1, dementia, delirium by Dr. Marsh Dolly on 8/21.    Patient has a previous diagnosis of bipolar 1 disorder and anxiety disorder and according to patient's power of attorney and caregiver, it is unclear if patient has been appropriately taking his prescribed medications for psychiatric conditions.  He has also been taking unprescribed Ambien for a long period of time putting him at risk of withdrawal. Current outpatient psychotropic medications include trazodone, Wellbutrin, Lexapro, melatonin, Remeron, Seroquel, and Depakote and historically he has had a fair response to these medications. On initial examination, patient is calm and initially is able to provide brief response; however, he shortly was falling more asleep and became less able to continue the assessment due to poor alertness.  After speaking with patient's power of attorney, we agreed to start an Ambien taper due to risk of withdrawal.  We will start Zyprexa for mood stabilization at this time as he was previously on this medication when he was hospitalized last year and seems to have benefited from the medication. Agree with primary team to continue Depakote and ordered a level to be checked tomorrow AM. Will discontinue Seroquel, trazodone, and Remeron due to risk of oversedation and polypharmacy.  We will start agitation protocol and provide delirium precautions recommendations.  Please see plan below for detailed recommendations.   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   Dementia with behavioral problem (HCC)   Support system deficit     Plan   ## Psychiatric Medication Recommendations:  - Start Ambien taper: 2.5 mg for 3 days then discontinue      - Continue monitoring for withdrawal symptoms - Start Zyprexa 5 mg for bipolar disorder with plan to increase to 10 mg if tolerated - Start agitation protocol:      - Zyprexa 5 mg disintegrating tablet Q8 as needed      - Ativan 1 mg oral as needed      - Geodon 20 mg IM as needed - Change melatonin time to 1800 for sleep regulation - Discontinue Seroquel, trazodone, and Remeron - Continue valproic acid 1000 mg nightly      -Valproic acid level check on 8/22 AM-ordered  ## Medical Decision Making Capacity:  Capacity was not formally addressed during this encounter; however, the patient appeared to understand and participate in the discussion about their treatment plan.  Patient was alert and oriented however was unable to fully concentrate in the assessment due to sedation.  ## Further Work-up:  -- Valproic acid level scheduled for 8/22 -- most recent EKG on 445 had QtC of 8/19 -- Pertinent labwork reviewed earlier this admission includes: AST and ALT WNL, LDL 55, UDS negative, alcohol < 10  ## Disposition:  -- There are no current psychiatric contraindications to discharge at this time  ## Behavioral / Environmental:  --  DELIRIUM RECS 1: Avoid benzodiazepines, antihistamines, anticholinergics, and minimize opiate use as these may worsen  delirium. 2:Assess, prevent and manage pain as lack of treatment can result in delirium.  3: Recommend consult to PT/OT if not already done. Early mobility and exercise has been shown to decrease duration of delirium.  4:Provide appropriate lighting and clear signage; a clock and calendar should be easily visible to the patient. 5:Monitor environmental  factors. Reduce light and noise at night (close shades, turn off lights, turn off TV, ect). Correct any alterations in sleep cycle. 6: Reorient the patient to person, place, time and situation on each encounter.  7: Correct sensory deficits if possible (replace eye glasses, hearing aids, ect). 8: Avoid restraints. Severely delirious patients benefit from constant observation by a sitter. 9: Do not leave patient unattended.     ## Safety and Observation Level:  - Based on my clinical evaluation, I estimate the patient to be at low risk of self harm in the current setting - At this time, we recommend a regular level of observation. This decision is based on my review of the chart including patient's history and current presentation, interview of the patient, mental status examination, and consideration of suicide risk including evaluating suicidal ideation, plan, intent, suicidal or self-harm behaviors, risk factors, and protective factors. This judgment is based on our ability to directly address suicide risk, implement suicide prevention strategies and develop a safety plan while the patient is in the clinical setting. Please contact our team if there is a concern that risk level has changed.  Suicide risk assessment  Patient has following modifiable risk factors for suicide: social isolation and medication noncompliance, which we are addressing by communicating with patient's caregiver and adjusting medications.   Patient has following non-modifiable or demographic risk factors for suicide: male gender and psychiatric hospitalization  Patient has the following protective factors against suicide: Supportive friends   Thank you for this consult request. Recommendations have been communicated to the primary team.  We will continue to follow at this time.   Lance Muss, MD  Psychiatric and Social History   Relevant Aspects of Hospital Course:  Admitted on 01/04/2023 for stroke.   Patient  Report:  Patient seen this morning.  Patient is oriented but intermittently alert, having delayed responses and periodic times of falling asleep.  He reports poor sleep and fair appetite.  He recalls taking 20 mg of Ambien daily since 1999 and he states he gets this medication from a doctor (this contradicts what caregiver reports).  Patient denies SI, HI, and AVH.  He reports urinary incontinence and shoulder pain.  He is unable to recall his past psychiatric conditions nor is able to recall what medications he is supposed to be taking at home.  He denies having previous hospitalizations, contrary to chart review in which he was hospitalized in the [place] last year.  As the assessment continued, patient closes eyes and would respond through head nods and shaking.  I discussed how we will optimize his medication regimen, start him on a taper for his Ambien use, and further get more insight from his caregiver and power of attorney.  Psychiatric ROS Mood Symptoms sleep disturbances (insomnia or hypersomnia)  Due to poor alertness, patient was unable to fully engage with the remainder of psych ROS.  Collateral information:  Contacted [name] (caregiver/power of attorney) at [phone] on 8/21.  She states that the patient has had a progressively steady decline for a long time but noticed a drastic change in his behavior about 2 weeks ago in which she describes  the patient is combative, talking sexually to the staff in his living facility, and smoking in the building when he knows he is not supposed to.  She recently became his power of attorney 2 weeks ago.  She states that she attempts to manage his medications but he is unwilling.  She is unsure if he appropriately takes his prescribed medication and there is a likelihood that he is under or overdosing himself.  She also states that the patient received a phone call in which she answered and it was a man trying to sell him more Ambien as the man stated  that he knew the patient ran out of Ambien.  Psychiatric History:  Information collected from the patient and collateral  Prev Dx/Sx: Bipolar 1 disorder Current Psych Provider: Sees a psychiatrist at the [facility] Current Meds: Trazodone, Wellbutrin, Lexapro, melatonin, Remeron, Seroquel, trazodone Previous Med Trials: Ambien, olanzapine, gabapentin  Prior Psych Hospitalization: Previously was hospitalized in the [place] hospital 08/3021 Prior Self Harm: Denies Prior Violence: Denies  Family Psych History: Denies Family Hx suicide: Denies  Social History:  Living Situation: Previously lived in [place] but not allowed back Access to weapons: Denies    Tobacco use: Yes, 1-2 PPD, would smoke "every 20 mins" if he could  Alcohol use: No  Other Substance use: Unprescribed Ambien use, states taking "20 mg daily"   Exam Findings   Psychiatric Specialty Exam:  Presentation  General Appearance:  Appropriate for Environment  Eye Contact: None  Speech: Slow; Slurred  Speech Volume: Decreased  Handedness: Right   Mood and Affect  Mood: Depressed  Affect: Congruent   Thought Process  Thought Processes: Coherent  Descriptions of Associations: Intact  Orientation: Full (Time, Place and Person)  Thought Content: Logical  History of Schizophrenia/Schizoaffective disorder: No  Hallucinations:Hallucinations: None  Ideas of Reference: None  Suicidal Thoughts:Suicidal Thoughts: No  Homicidal Thoughts:Homicidal Thoughts: No   Sensorium  Memory: Remote Poor; Recent Poor; Immediate Fair  Judgment: Impaired  Insight: Lacking   Executive Functions  Concentration: Poor  Attention Span: Poor  Recall: Poor  Fund of Knowledge: Fair  Language: Fair   Psychomotor Activity  Psychomotor Activity:Psychomotor Activity: Normal   Assets  Assets: Desire for Improvement; Social Support   Sleep  Sleep:Sleep: Poor    Physical Exam: Vital  signs:  Temp:  [97.6 F (36.4 C)-98.8 F (37.1 C)] 98.5 F (36.9 C) (08/21 1107) Pulse Rate:  [66-78] 77 (08/21 1107) Resp:  [16-17] 16 (08/21 1107) BP: (97-106)/(53-59) 97/54 (08/21 1107) SpO2:  [98 %-100 %] 98 % (08/21 1107) Physical Exam Vitals reviewed.  Constitutional:      General: He is not in acute distress. Cardiovascular:     Rate and Rhythm: Normal rate.  Pulmonary:     Effort: Pulmonary effort is normal.  Neurological:     Mental Status: He is oriented to person, place, and time.  Psychiatric:        Behavior: Behavior is not agitated.     Blood pressure (!) 97/54, pulse 77, temperature 98.5 F (36.9 C), temperature source Axillary, resp. rate 16, height 5\' 6"  (1.676 m), SpO2 98%. Body mass index is 22.06 kg/m.   Other History   These have been pulled in through the EMR, reviewed, and updated if appropriate.   Family History:  The patient's family history is not on file.  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   Dementia (HCC)    Seizures (HCC)    Sleep apnea     Surgical History: Past Surgical History:  Procedure Laterality Date   BILIARY BRUSHING  10/23/2021   Procedure: BILIARY BRUSHING;  Surgeon: Meridee Score Netty Starring., MD;  Location: [facility] ENDOSCOPY;  Service: Gastroenterology;;   BILIARY STENT PLACEMENT  10/23/2021   Procedure: BILIARY STENT PLACEMENT;  Surgeon: Lemar Lofty., MD;  Location: [facility] ENDOSCOPY;  Service: Gastroenterology;;   BIOPSY  10/23/2021   Procedure: BIOPSY;  Surgeon: Lemar Lofty., MD;  Location: [facility] ENDOSCOPY;  Service: Gastroenterology;;   ERCP N/A 10/23/2021   Procedure: ENDOSCOPIC RETROGRADE CHOLANGIOPANCREATOGRAPHY (ERCP);  Surgeon: Lemar Lofty., MD;  Location: [facility] ENDOSCOPY;  Service: Gastroenterology;  Laterality: N/A;   ESOPHAGOGASTRODUODENOSCOPY (EGD) WITH PROPOFOL N/A 10/23/2021    Procedure: ESOPHAGOGASTRODUODENOSCOPY (EGD) WITH PROPOFOL;  Surgeon: Meridee Score Netty Starring., MD;  Location: [facility] ENDOSCOPY;  Service: Gastroenterology;  Laterality: N/A;   FINE NEEDLE ASPIRATION  10/23/2021   Procedure: FINE NEEDLE ASPIRATION (FNA) LINEAR;  Surgeon: Lemar Lofty., MD;  Location: [facility] ENDOSCOPY;  Service: Gastroenterology;;   REMOVAL OF STONES  10/23/2021   Procedure: REMOVAL OF SLUDGE;  Surgeon: Lemar Lofty., MD;  Location: [facility] ENDOSCOPY;  Service: Gastroenterology;;   [name]  10/23/2021   Procedure: [name];  Surgeon: Lemar Lofty., MD;  Location: [facility] ENDOSCOPY;  Service: Gastroenterology;;   UPPER ESOPHAGEAL ENDOSCOPIC ULTRASOUND (EUS) N/A 10/23/2021   Procedure: UPPER ESOPHAGEAL ENDOSCOPIC ULTRASOUND (EUS);  Surgeon: Lemar Lofty., MD;  Location: [facility] ENDOSCOPY;  Service: Gastroenterology;  Laterality: N/A;    Medications:   Current Facility-Administered Medications:    aspirin EC tablet 81 mg, 81 mg, Oral, Daily, Lockie Mola, MD, 81 mg at 01/08/23 8295   Chlorhexidine Gluconate Cloth 2 % PADS 6 each, 6 each, Topical, Daily, McDiarmid, Leighton Roach, MD, 6 each at 01/08/23 6213   clopidogrel (PLAVIX) tablet 75 mg, 75 mg, Oral, Daily, Alicia Amel, MD, 75 mg at 01/08/23 0865   divalproex (DEPAKOTE ER) 24 hr tablet 1,000 mg, 1,000 mg, Oral, QHS, Peterson Ao, MD, 1,000 mg at 01/08/23 2133   donepezil (ARICEPT) tablet 5 mg, 5 mg, Oral, q morning, Lockie Mola, MD, 5 mg at 01/08/23 7846   enoxaparin (LOVENOX) injection 40 mg, 40 mg, Subcutaneous, Q24H, Peterson Ao, MD, 40 mg at 01/08/23 2127   levETIRAcetam (KEPPRA) tablet 1,500 mg, 1,500 mg, Oral, BID, Peterson Ao, MD, 1,500 mg at 01/08/23 2127   OLANZapine zydis (ZYPREXA) disintegrating tablet 5 mg, 5 mg, Oral, Q8H PRN **AND** LORazepam (ATIVAN) tablet 1 mg, 1 mg, Oral, PRN **AND** ziprasidone (GEODON) injection 20 mg, 20 mg, Intramuscular, PRN, , Esco Joslyn B, MD    melatonin tablet 5 mg, 5 mg, Oral, QHS, , Patsy Varma B, MD   nicotine (NICODERM CQ - dosed in mg/24 hours) patch 21 mg, 21 mg, Transdermal, Daily, Alicia Amel, MD   nicotine polacrilex (NICORETTE) gum 2 mg, 2 mg, Oral, PRN, Lockie Mola, MD   OLANZapine (ZYPREXA) tablet 5 mg, 5 mg, Oral, QHS, , Lab[place]ford Schnitker B, MD   polyethylene glycol (MIRALAX / GLYCOLAX) packet 17 g, 17 g, Oral, Daily, Darnelle Spangle B, MD   potassium chloride SA (KLOR-CON M) CR tablet 40 mEq, 40 mEq, Oral, Once, Everhart, Kirstie, DO   rOPINIRole (REQUIP) tablet 1 mg, 1 mg, Oral, QHS, Lockie Mola, MD, 1 mg at 01/08/23 2127   rosuvastatin (CRESTOR) tablet 5 mg, 5 mg, Oral, QHS,  Marvel Plan, MD, 5 mg at 01/08/23 2127   senna (SENOKOT) tablet 8.6 mg, 1 tablet, Oral, QHS, Darnelle Spangle B, MD, 8.6 mg at 01/08/23 2127   sodium chloride flush (NS) 0.9 % injection 10-40 mL, 10-40 mL, Intracatheter, Q12H, McDiarmid, Leighton Roach, MD, 10 mL at 01/08/23 2158   sodium chloride flush (NS) 0.9 % injection 10-40 mL, 10-40 mL, Intracatheter, PRN, McDiarmid, Leighton Roach, MD, 10 mL at 01/08/23 2159   umeclidinium bromide (INCRUSE ELLIPTA) 62.5 MCG/ACT 1 puff, 1 puff, Inhalation, Daily, Lockie Mola, MD, 1 puff at 01/08/23 1135   zolpidem (AMBIEN) tablet 2.5 mg, 2.5 mg, Oral, QHS, Lance Muss, MD  Allergies: Allergies  Allergen Reactions   Fluoride Other (See Comments)    Seizures and stroke after dental visit from using fluoride paste (per pt)

## 2023-01-09 NOTE — Plan of Care (Signed)
  Problem: Education: Goal: Knowledge of condition and prescribed therapy will improve Outcome: Progressing   Problem: Cardiac: Goal: Will achieve and/or maintain adequate cardiac output Outcome: Progressing   Problem: Physical Regulation: Goal: Complications related to the disease process, condition or treatment will be avoided or minimized Outcome: Progressing   Problem: Education: Goal: Knowledge of General Education information will improve Description: Including pain rating scale, medication(s)/side effects and non-pharmacologic comfort measures Outcome: Progressing   Problem: Health Behavior/Discharge Planning: Goal: Ability to manage health-related needs will improve Outcome: Progressing   Problem: Clinical Measurements: Goal: Ability to maintain clinical measurements within normal limits will improve Outcome: Progressing Goal: Will remain free from infection Outcome: Progressing Goal: Diagnostic test results will improve Outcome: Progressing Goal: Respiratory complications will improve Outcome: Progressing Goal: Cardiovascular complication will be avoided Outcome: Progressing   Problem: Activity: Goal: Risk for activity intolerance will decrease Outcome: Progressing   Problem: Nutrition: Goal: Adequate nutrition will be maintained Outcome: Progressing   Problem: Coping: Goal: Level of anxiety will decrease Outcome: Progressing   Problem: Elimination: Goal: Will not experience complications related to bowel motility Outcome: Progressing Goal: Will not experience complications related to urinary retention Outcome: Progressing   Problem: Pain Managment: Goal: General experience of comfort will improve Outcome: Progressing   Problem: Safety: Goal: Ability to remain free from injury will improve Outcome: Progressing   Problem: Skin Integrity: Goal: Risk for impaired skin integrity will decrease Outcome: Progressing   Problem: Education: Goal:  Knowledge of disease or condition will improve Outcome: Progressing Goal: Knowledge of secondary prevention will improve (MUST DOCUMENT ALL) Outcome: Progressing Goal: Knowledge of patient specific risk factors will improve Loraine Leriche N/A or DELETE if not current risk factor) Outcome: Progressing   Problem: Ischemic Stroke/TIA Tissue Perfusion: Goal: Complications of ischemic stroke/TIA will be minimized Outcome: Progressing   Problem: Coping: Goal: Will verbalize positive feelings about self Outcome: Progressing Goal: Will identify appropriate support needs Outcome: Progressing   Problem: Health Behavior/Discharge Planning: Goal: Ability to manage health-related needs will improve Outcome: Progressing Goal: Goals will be collaboratively established with patient/family Outcome: Progressing   Problem: Self-Care: Goal: Ability to participate in self-care as condition permits will improve Outcome: Progressing Goal: Verbalization of feelings and concerns over difficulty with self-care will improve Outcome: Progressing Goal: Ability to communicate needs accurately will improve Outcome: Progressing   Problem: Nutrition: Goal: Risk of aspiration will decrease Outcome: Progressing Goal: Dietary intake will improve Outcome: Progressing

## 2023-01-10 DIAGNOSIS — F319 Bipolar disorder, unspecified: Secondary | ICD-10-CM | POA: Diagnosis not present

## 2023-01-10 DIAGNOSIS — I6340 Cerebral infarction due to embolism of unspecified cerebral artery: Secondary | ICD-10-CM | POA: Diagnosis not present

## 2023-01-10 DIAGNOSIS — F0390 Unspecified dementia without behavioral disturbance: Secondary | ICD-10-CM | POA: Diagnosis not present

## 2023-01-10 DIAGNOSIS — R531 Weakness: Secondary | ICD-10-CM

## 2023-01-10 DIAGNOSIS — G40909 Epilepsy, unspecified, not intractable, without status epilepticus: Secondary | ICD-10-CM

## 2023-01-10 LAB — CBC
HCT: 29.6 % — ABNORMAL LOW (ref 39.0–52.0)
Hemoglobin: 9.7 g/dL — ABNORMAL LOW (ref 13.0–17.0)
MCH: 30 pg (ref 26.0–34.0)
MCHC: 32.8 g/dL (ref 30.0–36.0)
MCV: 91.6 fL (ref 80.0–100.0)
Platelets: 93 10*3/uL — ABNORMAL LOW (ref 150–400)
RBC: 3.23 MIL/uL — ABNORMAL LOW (ref 4.22–5.81)
RDW: 14.1 % (ref 11.5–15.5)
WBC: 3.5 10*3/uL — ABNORMAL LOW (ref 4.0–10.5)
nRBC: 0 % (ref 0.0–0.2)

## 2023-01-10 LAB — BASIC METABOLIC PANEL
Anion gap: 8 (ref 5–15)
BUN: 11 mg/dL (ref 8–23)
CO2: 26 mmol/L (ref 22–32)
Calcium: 8.8 mg/dL — ABNORMAL LOW (ref 8.9–10.3)
Chloride: 106 mmol/L (ref 98–111)
Creatinine, Ser: 0.72 mg/dL (ref 0.61–1.24)
GFR, Estimated: >60 mL/min (ref >60)
Glucose, Bld: 111 mg/dL — ABNORMAL HIGH (ref 70–99)
Potassium: 4 mmol/L (ref 3.5–5.1)
Sodium: 140 mmol/L (ref 135–145)

## 2023-01-10 LAB — VALPROIC ACID LEVEL: Valproic Acid Lvl: 62 ug/mL (ref 50.0–100.0)

## 2023-01-10 MED ORDER — NICOTINE 21 MG/24HR TD PT24
21.0000 mg | MEDICATED_PATCH | Freq: Every day | TRANSDERMAL | Status: DC
  Administered 2023-01-11 – 2023-01-12 (×2): 21 mg via TRANSDERMAL
  Filled 2023-01-10 (×2): qty 1

## 2023-01-10 MED ORDER — ROPINIROLE HCL 1 MG PO TABS
0.5000 mg | ORAL_TABLET | Freq: Every day | ORAL | Status: DC
  Administered 2023-01-10: 0.5 mg via ORAL
  Filled 2023-01-10: qty 1

## 2023-01-10 NOTE — Assessment & Plan Note (Addendum)
Recent unsafe behavior at [facility] - they would accept him back only if he had a sitter, leaving lit cigarettes. No acute events or agitation overnight. Started zyprexa, ambien taper and continue divalproex. Discontinued Seroquel, mirtazapine , trazodone to avoid oversedating meds.  - Psych c/s: recommend tapering off ropinirole d/t to possibly exacerbating sexual behaviors.   - Ambien 2.5 mg for 3 days then d/c, monitor for withdrawal   - Zyprexa 5 mg daily at bedtime, consider uptitrating dose to 10 mg if tolerable  Agitation protocol:  - zyprexa 5 mg prn q8h   - ativan 1 mg prn  - geodon 20 mg IM prn  - Divalproex 1000 mg at bedtime, VPA level 62  - Delirium Precautions

## 2023-01-10 NOTE — Assessment & Plan Note (Addendum)
Chronic smoker 1-2 PPD history - Nicotine patch and nicorette ordered  - Patient refusing because, he is attempting to quit, only takes 2-3 puffs of cigs whenever he does smoke

## 2023-01-10 NOTE — Consult Note (Addendum)
Kerry [name] Psychiatry Consult Evaluation  Service Date: January 10, 2023 LOS:  LOS: 3 days    Primary Psychiatric Diagnoses  Bipolar 1 disorder  Assessment  Kerry [name] is a 77 y.o. male admitted medically on 01/04/2023  5:16 PM for CVA. He carries the psychiatric diagnoses of bipolar 1 disorder and has a past medical history of pancreatic cancer and seizure disorder. Psychiatry was consulted for Bipolar 1, dementia, delirium by Dr. Marsh Dolly on 8/21.    Patient has a previous diagnosis of bipolar 1 disorder and according to patient's power of attorney and caregiver, it is unclear if patient has been appropriately taking his prescribed medications for psychiatric conditions.  He has also been taking unprescribed Ambien for a long period of time putting him at risk of withdrawal. Current outpatient psychotropic medications include trazodone, Wellbutrin, Lexapro, melatonin, Remeron, Seroquel, and Depakote and historically he has had a fair response to these medications. Per caregiver, patient has a history of slow processing cognitive decline until about 2 weeks ago in which he exhibited behaviors of hypersexuality and combativeness which was more of a drastic change. This may be due to withdrawal from Ambien use or over/under medicating which can also lead to mentation changes. On initial examination, patient is calm and initially is able to provide brief response; however, he shortly was falling more asleep and became less able to continue the assessment due to poor alertness.  After speaking with patient's power of attorney/caregiver, we agreed to start an Ambien taper due to risk of withdrawal.  We started Zyprexa for mood stabilization at this time as he was previously on this medication when he was hospitalized last year and seems to have benefited from the medication. Agree with primary team to continue Depakote and  Depakote level this AM was 62. Discontinued Seroquel, trazodone, and Remeron due  to risk of oversedation and polypharmacy.  We started agitation protocol and provide delirium precautions recommendations. Please see plan below for detailed recommendations.   8/22: Patient has improved alertness today, able to appropriately answer questions and voiced improved mood. We recommend taping off ropinirole as this may be exacerbating hypersexual behaviors and psychotic-like behavior noted by patient's caregiver and based on patient's description of kicking during his sleep, this does not meet criteria for restless leg syndrome. We will increase his Zyprexa to continue aiding in his sleep and mood.   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   Dementia with behavioral disturbance (HCC)   Support system deficit   Protein-calorie malnutrition, severe     Plan   ## Psychiatric Medication Recommendations:  - Decrease Requip to 0.5 mg due to risk of psychosis and impulsivity - Increase Zyprexa to 7.5 mg for mood stabilization - D/c melatonin  - Continue Ambien taper: 2.5 mg for 3 days then discontinue      - Continue monitoring for withdrawal symptoms - Continue agitation protocol:      - Zyprexa 5 mg disintegrating tablet Q8 as needed      - Ativan 1 mg oral as needed      - Geodon 20 mg IM as needed - Discontinued Seroquel, trazodone, and Remeron - Continue valproic acid 1000 mg nightly      -Valproic acid level check on 8/22 - 62  ## Medical Decision Making Capacity:  Capacity was not formally addressed during this encounter;  however, the patient appeared to understand and participate in the discussion about their treatment plan.    ## Further Work-up:  -- Valproic acid level 62 -- most recent EKG on 445 had QtC of 8/19 -- Pertinent labwork reviewed earlier this admission includes: AST and ALT WNL, LDL 55, UDS negative, alcohol < 10, iron  26, TIBC 232, ferritin 136  ## Disposition:  -- There are no current psychiatric contraindications to discharge at this time  ## Behavioral / Environmental:  --  DELIRIUM RECS 1: Avoid benzodiazepines, antihistamines, anticholinergics, and minimize opiate use as these may worsen delirium. 2:Assess, prevent and manage pain as lack of treatment can result in delirium.  3: Recommend consult to PT/OT if not already done. Early mobility and exercise has been shown to decrease duration of delirium.  4:Provide appropriate lighting and clear signage; a clock and calendar should be easily visible to the patient. 5:Monitor environmental factors. Reduce light and noise at night (close shades, turn off lights, turn off TV, ect). Correct any alterations in sleep cycle. 6: Reorient the patient to person, place, time and situation on each encounter.  7: Correct sensory deficits if possible (replace eye glasses, hearing aids, ect). 8: Avoid restraints. Severely delirious patients benefit from constant observation by a sitter. 9: Do not leave patient unattended.     ## Safety and Observation Level:  - Based on my clinical evaluation, I estimate the patient to be at low risk of self harm in the current setting - At this time, we recommend a regular level of observation. This decision is based on my review of the chart including patient's history and current presentation, interview of the patient, mental status examination, and consideration of suicide risk including evaluating suicidal ideation, plan, intent, suicidal or self-harm behaviors, risk factors, and protective factors. This judgment is based on our ability to directly address suicide risk, implement suicide prevention strategies and develop a safety plan while the patient is in the clinical setting. Please contact our team if there is a concern that risk level has changed.  Suicide risk assessment  Patient has following modifiable risk factors for  suicide: social isolation and medication noncompliance, which we are addressing by communicating with patient's caregiver and adjusting medications.   Patient has following non-modifiable or demographic risk factors for suicide: male gender and psychiatric hospitalization  Patient has the following protective factors against suicide: Supportive friends   Thank you for this consult request. Recommendations have been communicated to the primary team.  We will continue to follow at this time.   Lance Muss, MD  Psychiatric and Social History   Relevant Aspects of Hospital Course:  Admitted on 01/04/2023 for stroke.   Patient Report:  Patient seen this AM. Patient reports feeling better compared to yesterday regarding his mood. He reports good sleep and appetite. He has difficulty remember yesterday's events, stating "I have short term memory loss". He is alert and oriented x4, knowing his name and DOB, location, the date, and reason for admission. He denies side effects to the medication changes that the team made yesterday. He denies SI, HI, and AVH. He reports having a caregiver [name] who helps him at [facility], and he believes "Lavenia Atlas been kicked out from there". He plans to live in a hotel after discharge for the short term until he is able to find a more long term living situation.  Psychiatric ROS Mood Symptoms Sleep disturbances (insomnia or hypersomnia)  Manic Symptoms Hypersexuality, impulsiveness  Collateral information:  Contacted [name] (caregiver/power of attorney) at [phone] on 8/21.  She states that the patient has had a progressively steady decline for a long time but noticed a drastic change in his behavior about 2 weeks ago in which she describes the patient is combative, talking sexually to the staff in his living facility, and smoking in the building when he knows he is not supposed to.  She recently became his power of attorney 2 weeks ago.  She states  that she attempts to manage his medications but he is unwilling.  She is unsure if he appropriately takes his prescribed medication and there is a likelihood that he is under or overdosing himself.  She also states that the patient received a phone call in which she answered and it was a man trying to sell him more Ambien as the man stated that he knew the patient ran out of Ambien.  Psychiatric History:  Information collected from the patient and collateral  Prev Dx/Sx: Bipolar 1 disorder Current Psych Provider: Sees a psychiatrist at the [facility] Current Meds: Trazodone, Wellbutrin, Lexapro, melatonin, Remeron, Seroquel, trazodone Previous Med Trials: Ambien, olanzapine, gabapentin  Prior Psych Hospitalization: Previously was hospitalized in the [place] hospital 08/3021 Prior Self Harm: Denies Prior Violence: Denies  Family Psych History: Denies Family Hx suicide: Denies  Social History:  Living Situation: Previously lived in [place] but not allowed back Access to weapons: Denies    Tobacco use: Yes, 1-2 PPD, would smoke "every 20 mins" if he could  Alcohol use: No  Other Substance use: Unprescribed Ambien use, states taking "20 mg daily"   Exam Findings   Psychiatric Specialty Exam:  Presentation  General Appearance:  Appropriate for Environment  Eye Contact: Good  Speech: Clear and Coherent  Speech Volume: Normal  Handedness: Right   Mood and Affect  Mood: Euphoric  Affect: Appropriate; Congruent   Thought Process  Thought Processes: Coherent  Descriptions of Associations: Intact  Orientation: Full (Time, Place and Person)  Thought Content: Logical  History of Schizophrenia/Schizoaffective disorder: No  Hallucinations:Hallucinations: None  Ideas of Reference: None  Suicidal Thoughts:Suicidal Thoughts: No  Homicidal Thoughts:Homicidal Thoughts: No   Sensorium  Memory: Immediate Fair; Recent Fair; Remote  Fair  Judgment: Fair  Insight: Fair   Executive Functions  Concentration: Good  Attention Span: Good  Recall: Good  Fund of Knowledge: Good  Language: Good   Psychomotor Activity  Psychomotor Activity:Psychomotor Activity: Normal   Assets  Assets: Communication Skills; Desire for Improvement; Financial Resources/Insurance   Sleep  Sleep:Sleep: Good    Physical Exam: Vital signs:  Temp:  [97.6 F (36.4 C)-98 F (36.7 C)] 97.9 F (36.6 C) (08/22 1252) Pulse Rate:  [64-81] 81 (08/22 1252) Resp:  [16-18] 18 (08/22 1252) BP: (103-126)/(63-76) 126/76 (08/22 1252) SpO2:  [98 %-100 %] 98 % (08/22 1252) Physical Exam Vitals reviewed.  Constitutional:      General: He is not in acute distress. Cardiovascular:     Rate and Rhythm: Normal rate.  Pulmonary:     Effort: Pulmonary effort is normal.  Neurological:     Mental Status: He is oriented to person, place, and time.  Psychiatric:        Behavior: Behavior is not agitated.     Blood pressure 126/76, pulse 81, temperature 97.9 F (36.6 C), temperature source Oral, resp. rate 18, height 5\' 6"  (1.676 m), SpO2 98%. Body mass index is 22.06 kg/m.   Other History   These have  been pulled in through the EMR, reviewed, and updated if appropriate.   Family History:  The patient's family history is not on file.  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   Dementia (HCC)    Seizures (HCC)    Sleep apnea     Surgical History: Past Surgical History:  Procedure Laterality Date   BILIARY BRUSHING  10/23/2021   Procedure: BILIARY BRUSHING;  Surgeon: Meridee Score Netty Starring., MD;  Location: [facility] ENDOSCOPY;  Service: Gastroenterology;;   BILIARY STENT PLACEMENT  10/23/2021   Procedure: BILIARY STENT PLACEMENT;  Surgeon: Lemar Lofty., MD;  Location: [facility] ENDOSCOPY;  Service:  Gastroenterology;;   BIOPSY  10/23/2021   Procedure: BIOPSY;  Surgeon: Lemar Lofty., MD;  Location: [facility] ENDOSCOPY;  Service: Gastroenterology;;   ERCP N/A 10/23/2021   Procedure: ENDOSCOPIC RETROGRADE CHOLANGIOPANCREATOGRAPHY (ERCP);  Surgeon: Lemar Lofty., MD;  Location: [facility] ENDOSCOPY;  Service: Gastroenterology;  Laterality: N/A;   ESOPHAGOGASTRODUODENOSCOPY (EGD) WITH PROPOFOL N/A 10/23/2021   Procedure: ESOPHAGOGASTRODUODENOSCOPY (EGD) WITH PROPOFOL;  Surgeon: Meridee Score Netty Starring., MD;  Location: [facility] ENDOSCOPY;  Service: Gastroenterology;  Laterality: N/A;   FINE NEEDLE ASPIRATION  10/23/2021   Procedure: FINE NEEDLE ASPIRATION (FNA) LINEAR;  Surgeon: Lemar Lofty., MD;  Location: [facility] ENDOSCOPY;  Service: Gastroenterology;;   REMOVAL OF STONES  10/23/2021   Procedure: REMOVAL OF SLUDGE;  Surgeon: Lemar Lofty., MD;  Location: [facility] ENDOSCOPY;  Service: Gastroenterology;;   [name]  10/23/2021   Procedure: [name];  Surgeon: Lemar Lofty., MD;  Location: [facility] ENDOSCOPY;  Service: Gastroenterology;;   UPPER ESOPHAGEAL ENDOSCOPIC ULTRASOUND (EUS) N/A 10/23/2021   Procedure: UPPER ESOPHAGEAL ENDOSCOPIC ULTRASOUND (EUS);  Surgeon: Lemar Lofty., MD;  Location: [facility] ENDOSCOPY;  Service: Gastroenterology;  Laterality: N/A;    Medications:   Current Facility-Administered Medications:    aspirin EC tablet 81 mg, 81 mg, Oral, Daily, Lockie Mola, MD, 81 mg at 01/10/23 1003   Chlorhexidine Gluconate Cloth 2 % PADS 6 each, 6 each, Topical, Daily, McDiarmid, Leighton Roach, MD, 6 each at 01/10/23 1004   clopidogrel (PLAVIX) tablet 75 mg, 75 mg, Oral, Daily, Alicia Amel, MD, 75 mg at 01/10/23 1004   divalproex (DEPAKOTE ER) 24 hr tablet 1,000 mg, 1,000 mg, Oral, QHS, Peterson Ao, MD, 1,000 mg at 01/09/23 2041   donepezil (ARICEPT) tablet 5 mg, 5 mg, Oral, q morning, Lockie Mola, MD, 5 mg at 01/10/23 1003   enoxaparin (LOVENOX) injection 40  mg, 40 mg, Subcutaneous, Q24H, Peterson Ao, MD, 40 mg at 01/09/23 2041   feeding supplement (ENSURE ENLIVE / ENSURE PLUS) liquid 237 mL, 237 mL, Oral, TID BM, McDiarmid, Leighton Roach, MD, 237 mL at 01/10/23 1414   levETIRAcetam (KEPPRA) tablet 1,500 mg, 1,500 mg, Oral, BID, Peterson Ao, MD, 1,500 mg at 01/10/23 1004   OLANZapine zydis (ZYPREXA) disintegrating tablet 5 mg, 5 mg, Oral, Q8H PRN **AND** LORazepam (ATIVAN) tablet 1 mg, 1 mg, Oral, PRN **AND** ziprasidone (GEODON) injection 20 mg, 20 mg, Intramuscular, PRN, Kizzie Ide B, MD   multivitamin with minerals tablet 1 tablet, 1 tablet, Oral, Daily, McDiarmid, Leighton Roach, MD, 1 tablet at 01/10/23 1003   nicotine (NICODERM CQ - dosed in mg/24 hours) patch 21 mg, 21 mg, Transdermal, Daily, Shitarev, Dimitry, MD   nicotine polacrilex (NICORETTE) gum 2 mg, 2 mg, Oral, PRN, Lockie Mola,  MD   OLANZapine (ZYPREXA) tablet 5 mg, 5 mg, Oral, QHS, , Chinara Hertzberg B, MD, 5 mg at 01/09/23 1748   polyethylene glycol (MIRALAX / GLYCOLAX) packet 17 g, 17 g, Oral, Daily, Darnelle Spangle B, MD, 17 g at 01/10/23 1004   rOPINIRole (REQUIP) tablet 0.5 mg, 0.5 mg, Oral, QHS, , Diondra Pines B, MD   rosuvastatin (CRESTOR) tablet 5 mg, 5 mg, Oral, QHS, Marvel Plan, MD, 5 mg at 01/09/23 2041   senna (SENOKOT) tablet 8.6 mg, 1 tablet, Oral, QHS, Darnelle Spangle B, MD, 8.6 mg at 01/09/23 2042   sodium chloride flush (NS) 0.9 % injection 10-40 mL, 10-40 mL, Intracatheter, Q12H, McDiarmid, Leighton Roach, MD, 10 mL at 01/10/23 1004   sodium chloride flush (NS) 0.9 % injection 10-40 mL, 10-40 mL, Intracatheter, PRN, McDiarmid, Leighton Roach, MD, 10 mL at 01/08/23 2159   umeclidinium bromide (INCRUSE ELLIPTA) 62.5 MCG/ACT 1 puff, 1 puff, Inhalation, Daily, Lockie Mola, MD, 1 puff at 01/10/23 0759   zolpidem (AMBIEN) tablet 2.5 mg, 2.5 mg, Oral, QHS, , Fausto Skillern B, MD, 2.5 mg at 01/09/23 1748  Allergies: Allergies  Allergen Reactions   Fluoride Other (See Comments)    Seizures and  stroke after dental visit from using fluoride paste (per pt)

## 2023-01-10 NOTE — Assessment & Plan Note (Signed)
Patient has POA, who helps patient, can be reached on the phone after 9 AM - Kerry [name], [phone], pt's POA

## 2023-01-10 NOTE — Progress Notes (Signed)
Occupational Therapy Treatment Patient Details Name: Kerry [name] MRN: [id number] DOB: 02-06-1946 Today's Date: 01/10/2023   History of present illness Pt is a 77 y.o. male who presented 01/04/23 with chest pain and brief syncopal episode. EKG is without evidence of acute ischemia.  Troponins are without elevation or significant delta. PMH: dementia, Anxiety, Bipolar d/o, OSA , Seizure d/o  pancreatic cancer stage III on Chemo   OT comments  Attempted pill box test to further assess functional cognition for daily routine mgmt. Despite various approach styles, pt unable to complete pill box test,  becoming easily agitated and reporting, "you do it" to OT.  Pt will need consistent med mgmt assist as well as other IADLs (cooking) at DC. Pt agreeable for OOB mobility but quickly changed his mind when educated that he was unable to go outside/off unit at this time.       If plan is discharge home, recommend the following:  Assist for transportation;Assistance with cooking/housework;Direct supervision/assist for medications management;Direct supervision/assist for financial management   Equipment Recommendations  None recommended by OT    Recommendations for Other Services      Precautions / Restrictions Precautions Precautions: Fall Precaution Comments: easily agitated Restrictions Weight Bearing Restrictions: No       Mobility Bed Mobility               General bed mobility comments: declined OOB d/t not being able to go outside    Transfers                         Balance                                           ADL either performed or assessed with clinical judgement   ADL Overall ADL's : Needs assistance/impaired                                       General ADL Comments: Attempted pill box test and provided brief instructions. pt quickly confused asking if it was his pill box, asking about his real meds. prompted with  reading pill box labels, read med label to pt and asked how he would place them in box though pt then reported to OT "you do it". pt also ate fruit loop, asked if he should chew it or let it dissolve. when prompted to attempt task without OT assist, pt became quickly agitated.    Extremity/Trunk Assessment Upper Extremity Assessment Upper Extremity Assessment: Overall WFL for tasks assessed   Lower Extremity Assessment Lower Extremity Assessment: Defer to PT evaluation        Vision   Vision Assessment?: No apparent visual deficits;Wears glasses for reading   Perception     Praxis      Cognition Arousal: Alert Behavior During Therapy: Restless, Impulsive, Agitated Overall Cognitive Status: History of cognitive impairments - at baseline                                 General Comments: memory deficits, poor insight into deficits, easily agitated when redirected to rules (asking about going outside to smoke, etc).        Exercises  Shoulder Instructions       General Comments      Pertinent Vitals/ Pain       Pain Assessment Pain Assessment: No/denies pain  Home Living                                          Prior Functioning/Environment              Frequency  Min 1X/week        Progress Toward Goals  OT Goals(current goals can now be found in the care plan section)  Progress towards OT goals: OT to reassess next treatment  Acute Rehab OT Goals Patient Stated Goal: go outside OT Goal Formulation: With patient Time For Goal Achievement: 01/22/23 Potential to Achieve Goals: Fair ADL Goals Pt/caregiver will Perform Home Exercise Program: Increased strength;Both right and left upper extremity;With theraband;With written HEP provided;With Supervision Additional ADL Goal #1: Pt to complete pill box test with 0 errors Additional ADL Goal #2: Pt to complete 3 step trail making task without verbal cues Additional ADL  Goal #3: Pt to verbalize at least 3 fall prevention strategies  Plan      Co-evaluation                 AM-PAC OT "6 Clicks" Daily Activity     Outcome Measure   Help from another person eating meals?: None Help from another person taking care of personal grooming?: A Little Help from another person toileting, which includes using toliet, bedpan, or urinal?: A Little Help from another person bathing (including washing, rinsing, drying)?: A Little Help from another person to put on and taking off regular upper body clothing?: A Little Help from another person to put on and taking off regular lower body clothing?: A Little 6 Click Score: 19    End of Session    OT Visit Diagnosis: Muscle weakness (generalized) (M62.81);Other symptoms and signs involving cognitive function   Activity Tolerance Other (comment) (limited by cognition)   Patient Left in bed;with call bell/phone within reach;with bed alarm set   Nurse Communication          Time: 1351-1403 OT Time Calculation (min): 12 min  Charges: OT General Charges $OT Visit: 1 Visit OT Treatments $Therapeutic Activity: 8-22 mins  Bradd Canary, OTR/L Acute Rehab Services Office: [phone]   Lorre Munroe 01/10/2023, 2:22 PM

## 2023-01-10 NOTE — Assessment & Plan Note (Signed)
K 4.0 this AM, improving  - AM BMP

## 2023-01-10 NOTE — Plan of Care (Signed)
  Problem: Education: Goal: Knowledge of condition and prescribed therapy will improve Outcome: Progressing   Problem: Cardiac: Goal: Will achieve and/or maintain adequate cardiac output Outcome: Progressing   Problem: Physical Regulation: Goal: Complications related to the disease process, condition or treatment will be avoided or minimized Outcome: Progressing   Problem: Education: Goal: Knowledge of General Education information will improve Description: Including pain rating scale, medication(s)/side effects and non-pharmacologic comfort measures Outcome: Progressing   Problem: Health Behavior/Discharge Planning: Goal: Ability to manage health-related needs will improve Outcome: Progressing   Problem: Clinical Measurements: Goal: Ability to maintain clinical measurements within normal limits will improve Outcome: Progressing Goal: Will remain free from infection Outcome: Progressing Goal: Diagnostic test results will improve Outcome: Progressing

## 2023-01-10 NOTE — Assessment & Plan Note (Addendum)
Admitted for inpatient workup.  MR brain with acute infarcts involving the right insula, temporoparietal cortex, and medial frontal cortex without evidence of hemorrhage. CTA Head/Neck with no intracranial large vessel occlusion or significant stenosis. Mild stenosis to the right supraclinoid ICA. - Neuro recs: continue statin, ASA + Plavix for 3 weeks, get 30 day cardiac monitoring outpatient to r/o cardioembolic source  - Continue home meds Depakote and Keppra  - PT recs HH PT, OT no need therapy at d/c, SLP: will work with acutely  - LE DVT: Negative for DVT

## 2023-01-10 NOTE — Progress Notes (Addendum)
Daily Progress Note Intern Pager: [phone]  Patient name: Kerry [name] Medical record number: [id number] Date of birth: 1945-05-27 Age: 77 y.o. Gender: male  Primary Care Provider: [facility] Consultants: Neurology and Psychiatry  Code Status: Full Code   Pt Overview and Major Events to Date:  8/19 Admitted to FMTS  8/20 Increased night time agitation 8/21 Psych c/s started on zyprexa, d/c other home meds 8/22    Assessment and Plan: Kerry [name] is a 77 y.o. 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 Neurology following and medications adjustments. Psych recs ambien taper, starting zyprexa, and continuing depakote. Patients behavior much better improved after recs. Psych discontinued ropinirole.   Awaiting dispo placement and  team discussing options with case worker.  Assessment & Plan Stroke [facility]) Admitted for inpatient workup.  MR brain with acute infarcts involving the right insula, temporoparietal cortex, and medial frontal cortex without evidence of hemorrhage. CTA Head/Neck with no intracranial large vessel occlusion or significant stenosis. Mild stenosis to the right supraclinoid ICA. - Neuro recs: continue statin, ASA + Plavix for 3 weeks, get 30 day cardiac monitoring outpatient to r/o cardioembolic source  - Continue home meds Depakote and Keppra  - PT recs HH PT, OT no need therapy at d/c, SLP: will work with acutely  - LE DVT: Negative for DVT Dementia with behavioral problem [facility]) Recent unsafe behavior at [facility] - they would accept him back only if he had a sitter, leaving lit cigarettes. No acute events or agitation overnight. Started zyprexa, ambien taper and continue divalproex. Discontinued Seroquel, mirtazapine , trazodone to avoid oversedating meds.  - Psych c/s: recommend tapering off ropinirole d/t to possibly exacerbating sexual behaviors.   - Ambien 2.5 mg for 3 days then d/c, monitor for withdrawal   - Zyprexa 5 mg daily  at bedtime, consider uptitrating dose to 10 mg if tolerable  Agitation protocol:  - zyprexa 5 mg prn q8h   - ativan 1 mg prn  - geodon 20 mg IM prn  - Divalproex 1000 mg at bedtime, VPA level 62  - Delirium Precautions  Bipolar 1 disorder (HCC) Prior history of seizure disorder as well, Home medications divalproex 1000  mg at bedtime   divalproex 1000 mg at bedtime, VPA therapeutic level  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 Chronic smoker 1-2 PPD history - Nicotine patch and nicorette ordered  - Patient refusing because, he is attempting to quit, only takes 2-3 puffs of cigs whenever he does smoke Hypokalemia K 4.0 this AM, improving  - AM BMP  Restless leg syndrome - Home ropinorole ordered  Support system deficit Patient has POA, who helps patient, can be reached on the phone after 9 AM - [name], [phone], pt's POA  Protein-calorie malnutrition, severe Continue reg diet  Ensure BID  MVI   FEN/GI: Regular PPx: Lovenox  Dispo: ?Independent living working w/ case management for placement, recently kicked out of [facility].   Subjective:  Patient alertness much better this morning. Took sleep meds and sleep through the night. Denies fevers, chills, headache, nausea, or emesis. Only smokes 2-3 puffs of cigarettes and attempting to limit use naturally. Would like to go home, discussed trying to speak with case workers for options. Patient fine with staying.   Objective: Temp:  [97.6 F (36.4 C)-98.5 F (36.9 C)] 98 F (36.7 C) (08/22 0722) Pulse Rate:  [64-77] 70 (08/22 0800) Resp:  [16-18] 16 (08/22 0800) BP: (97-123)/(54-69) 103/63 (08/22  6962) SpO2:  [98 %-100 %] 100 % (08/22 0800)  Physical Exam: General: Alert, sitting up   Cardiovascular: RRR, no murmurs appreciated  Respiratory: CTAB  in the anterior fields Abdomen: tender to palpation in epigastric region, location of pancreastic cancer, active bowel sounds Extremities: No lower extremity edema   Neuro: Alert and Oriented x3, conversational, No SI/HI/AVH  Laboratory: Most recent CBC Lab Results  Component Value Date   WBC 3.5 (L) 01/10/2023   HGB 9.7 (L) 01/10/2023   HCT 29.6 (L) 01/10/2023   MCV 91.6 01/10/2023   PLT 93 (L) 01/10/2023   Most recent BMP    Latest Ref Rng & Units 01/10/2023    5:05 AM  BMP  Glucose 70 - 99 mg/dL 952   BUN 8 - 23 mg/dL 11   Creatinine 8.41 - 1.24 mg/dL 3.24   Sodium 401 - 027 mmol/L 140   Potassium 3.5 - 5.1 mmol/L 4.0   Chloride 98 - 111 mmol/L 106   CO2 22 - 32 mmol/L 26   Calcium 8.9 - 10.3 mg/dL 8.8     Other pertinent labs   Iron and TIBC low  Ferritin and reticulocytes pending    Imaging/Diagnostic Tests: CT Head w/o contrast:    IMPRESSION: 1. 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 No hemorrhage. 2. Dense M2/M3 branch on the right, which could represent thrombus.   CT Angio   IMPRESSION: 1. No intracranial large vessel occlusion or significant stenosis. Mild stenosis in the right supraclinoid ICA. 2. No hemodynamically significant stenosis in the neck. 3. Aortic atherosclerosis.  MRI wo contrast Acute infarcts involving the right insula, temporoparietal cortex, and medial frontal cortex. No evidence of hemorrhage.  Peterson Ao, MD 01/10/2023, 8:50 AM  PGY-1, [facility] Health Family Medicine FPTS Intern pager: [phone], text pages welcome Secure chat group [facility] Teaching Service

## 2023-01-10 NOTE — Assessment & Plan Note (Signed)
-   Home ropinorole ordered

## 2023-01-10 NOTE — Progress Notes (Signed)
Brief Progress Note:   This Clinical research associate had a conversation with, Kerry [name] [phone], his caregiver about dispo planning. Care giver, stated that she would be able to watch or arrange a sitter to monitor the patient if discharged. Would need time to schedule sitters, to ensure monitoring could take place. She spoke to the patient earlier today, stated if medically stable and behavior is improved he could come back to the facility. Discussed making medication adjustments and also having them filled prior to discharge, once medically stable.Writer, will follow-up tomorrow after AM rounds to update care giver.    Peterson Ao, MD 01/10/2023, 6:28 PM   PGY-1, [facility] Health Family Medicine FPTS Intern pager: [phone], text pages welcome Secure chat group [facility] Teaching Service

## 2023-01-10 NOTE — Assessment & Plan Note (Addendum)
Prior history of seizure disorder as well, Home medications divalproex 1000  mg at bedtime   divalproex 1000 mg at bedtime, VPA therapeutic level

## 2023-01-10 NOTE — Assessment & Plan Note (Signed)
Continue reg diet  Ensure BID  MVI

## 2023-01-11 ENCOUNTER — Encounter (HOSPITAL_COMMUNITY): Payer: Self-pay

## 2023-01-11 ENCOUNTER — Other Ambulatory Visit (HOSPITAL_COMMUNITY): Payer: Self-pay

## 2023-01-11 DIAGNOSIS — F03918 Unspecified dementia, unspecified severity, with other behavioral disturbance: Secondary | ICD-10-CM | POA: Diagnosis not present

## 2023-01-11 DIAGNOSIS — F319 Bipolar disorder, unspecified: Secondary | ICD-10-CM | POA: Diagnosis not present

## 2023-01-11 DIAGNOSIS — C801 Malignant (primary) neoplasm, unspecified: Secondary | ICD-10-CM

## 2023-01-11 DIAGNOSIS — I6340 Cerebral infarction due to embolism of unspecified cerebral artery: Secondary | ICD-10-CM | POA: Diagnosis not present

## 2023-01-11 DIAGNOSIS — G40909 Epilepsy, unspecified, not intractable, without status epilepticus: Secondary | ICD-10-CM | POA: Diagnosis not present

## 2023-01-11 DIAGNOSIS — F0390 Unspecified dementia without behavioral disturbance: Secondary | ICD-10-CM | POA: Diagnosis not present

## 2023-01-11 DIAGNOSIS — R471 Dysarthria and anarthria: Secondary | ICD-10-CM

## 2023-01-11 LAB — BASIC METABOLIC PANEL
Anion gap: 8 (ref 5–15)
BUN: 14 mg/dL (ref 8–23)
CO2: 26 mmol/L (ref 22–32)
Calcium: 8.7 mg/dL — ABNORMAL LOW (ref 8.9–10.3)
Chloride: 102 mmol/L (ref 98–111)
Creatinine, Ser: 0.66 mg/dL (ref 0.61–1.24)
GFR, Estimated: >60 mL/min (ref >60)
Glucose, Bld: 88 mg/dL (ref 70–99)
Potassium: 3.5 mmol/L (ref 3.5–5.1)
Sodium: 136 mmol/L (ref 135–145)

## 2023-01-11 MED ORDER — OLANZAPINE 5 MG PO TBDP
5.0000 mg | ORAL_TABLET | Freq: Three times a day (TID) | ORAL | 0 refills | Status: AC | PRN
  Filled 2023-01-11: qty 30, 10d supply, fill #0

## 2023-01-11 MED ORDER — DONEPEZIL HCL 5 MG PO TABS
5.0000 mg | ORAL_TABLET | Freq: Every morning | ORAL | 0 refills | Status: AC
  Filled 2023-01-11: qty 30, 30d supply, fill #0

## 2023-01-11 MED ORDER — NICOTINE POLACRILEX 2 MG MT GUM
2.0000 mg | CHEWING_GUM | OROMUCOSAL | 0 refills | Status: DC | PRN
Start: 2023-01-11 — End: 2023-01-11
  Filled 2023-01-11: qty 100, fill #0

## 2023-01-11 MED ORDER — CLOPIDOGREL BISULFATE 75 MG PO TABS
75.0000 mg | ORAL_TABLET | Freq: Every day | ORAL | 0 refills | Status: DC
Start: 2023-01-12 — End: 2023-01-12
  Filled 2023-01-11: qty 17, 17d supply, fill #0

## 2023-01-11 MED ORDER — SALINE SPRAY 0.65 % NA SOLN
1.0000 | NASAL | Status: DC | PRN
  Administered 2023-01-11: 1 via NASAL
  Filled 2023-01-11: qty 44

## 2023-01-11 MED ORDER — OLANZAPINE 2.5 MG PO TABS: 7.5000 mg | ORAL_TABLET | Freq: Every day | ORAL | Status: DC

## 2023-01-11 MED ORDER — ZOLPIDEM TARTRATE 5 MG PO TABS
2.5000 mg | ORAL_TABLET | Freq: Once | ORAL | Status: AC
  Administered 2023-01-11: 2.5 mg via ORAL
  Filled 2023-01-11: qty 1

## 2023-01-11 MED ORDER — OLANZAPINE 7.5 MG PO TABS
7.5000 mg | ORAL_TABLET | Freq: Every day | ORAL | 0 refills | Status: DC
  Filled 2023-01-11: qty 30, 30d supply, fill #0

## 2023-01-11 MED ORDER — NICOTINE 21 MG/24HR TD PT24
21.0000 mg | MEDICATED_PATCH | Freq: Every day | TRANSDERMAL | 0 refills | Status: DC
  Filled 2023-01-11: qty 28, 28d supply, fill #0

## 2023-01-11 MED ORDER — UMECLIDINIUM BROMIDE 62.5 MCG/ACT IN AEPB
1.0000 | INHALATION_SPRAY | Freq: Every day | RESPIRATORY_TRACT | 0 refills | Status: DC
  Filled 2023-01-11: qty 30, 30d supply, fill #0

## 2023-01-11 MED ORDER — OLANZAPINE 2.5 MG PO TABS
10.0000 mg | ORAL_TABLET | Freq: Every day | ORAL | Status: DC
  Administered 2023-01-11: 10 mg via ORAL
  Filled 2023-01-11: qty 4

## 2023-01-11 MED ORDER — ADULT MULTIVITAMIN W/MINERALS CH
1.0000 | ORAL_TABLET | Freq: Every day | ORAL | 0 refills | Status: AC
  Filled 2023-01-11: qty 130, 130d supply, fill #0

## 2023-01-11 MED ORDER — SENNOSIDES-DOCUSATE SODIUM 8.6-50 MG PO TABS
1.0000 | ORAL_TABLET | Freq: Once | ORAL | Status: AC
  Administered 2023-01-11: 1 via ORAL
  Filled 2023-01-11: qty 1

## 2023-01-11 MED ORDER — ENSURE ENLIVE PO LIQD
237.0000 mL | Freq: Three times a day (TID) | ORAL | 12 refills | Status: AC
  Filled 2023-01-11: qty 237, 1d supply, fill #0

## 2023-01-11 NOTE — Consult Note (Addendum)
Kerry [name] Psychiatry Consult Evaluation  Service Date: January 11, 2023 LOS:  LOS: 4 days    Primary Psychiatric Diagnoses  Bipolar 1 disorder  Assessment  Kerry [name] is a 77 y.o. male admitted medically on 01/04/2023  5:16 PM for CVA. He carries the psychiatric diagnoses of bipolar 1 disorder and has a past medical history of pancreatic cancer and seizure disorder. Psychiatry was consulted for Bipolar 1, dementia, delirium by Dr. Marsh Dolly on 8/21.    Patient has a previous diagnosis of bipolar 1 disorder and according to patient's power of attorney and caregiver, it is unclear if patient has been appropriately taking his prescribed medications for psychiatric conditions.  He has also been taking unprescribed Ambien for a long period of time putting him at risk of withdrawal. Current outpatient psychotropic medications include trazodone, Wellbutrin, Lexapro, melatonin, Remeron, Seroquel, and Depakote and historically he has had a fair response to these medications. Per caregiver, patient has a history of slow processing cognitive decline until about 2 weeks ago in which he exhibited behaviors of hypersexuality and combativeness which was more of a drastic change. This may be due to withdrawal from Ambien use or over/under medicating which can also lead to mentation changes. On initial examination, patient is calm and initially is able to provide brief response; however, he shortly was falling more asleep and became less able to continue the assessment due to poor alertness.  After speaking with patient's power of attorney/caregiver, we agreed to start an Ambien taper due to risk of withdrawal.  We started Zyprexa for mood stabilization at this time as he was previously on this medication when he was hospitalized last year and seems to have benefited from the medication. Agree with primary team to continue Depakote and  Depakote level on 8/22 was 62. Discontinued Seroquel, trazodone, and Remeron due  to risk of oversedation and polypharmacy.  We started agitation protocol and provide delirium precautions recommendations. Please see plan below for detailed recommendations.   8/23: Patient continues to show symptomatic improvement. He is able to contract for safety and plans to return to his facility with a sitter. Patient's caregiver is helping collaborate patient's disposition planning and assisting the patient for medication management. We will increase Zyprexa tonight to continue aiding in mood and sleep. We will discontinue patient's ropinirole and Ambien today as patient has been tapering off these medications. Psychiatry will sign off at this time, please reconsult if additional questions arise.   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   Dementia with behavioral disturbance (HCC)   Support system deficit   Protein-calorie malnutrition, severe     Plan   ## Psychiatric Medication Recommendations:  - Increase Zyprexa to 10 mg nightly for mood stabilization - Discontinue ropinirole  - Ambien taper completed - Continue agitation protocol:      - Zyprexa 5 mg disintegrating tablet Q8 as needed      - Ativan 1 mg oral as needed      - Geodon 20 mg IM as needed - Discontinued Seroquel, trazodone, and Remeron - Continue valproic acid 1000 mg nightly      -Valproic acid level check on 8/22 - 62  ## Medical Decision Making Capacity:  Capacity was not formally addressed during this encounter; however, the patient appeared to understand and participate in the discussion about their treatment plan.    ##  Further Work-up:  -- Valproic acid level 62 -- most recent EKG on 445 had QtC of 8/19 -- Pertinent labwork reviewed earlier this admission includes: AST and ALT WNL, LDL 55, UDS negative, alcohol < 10, iron 26, TIBC 232, ferritin 136  ## Disposition:   -- There are no current psychiatric contraindications to discharge at this time  ## Behavioral / Environmental:  --  DELIRIUM RECS 1: Avoid benzodiazepines, antihistamines, anticholinergics, and minimize opiate use as these may worsen delirium. 2:Assess, prevent and manage pain as lack of treatment can result in delirium.  3: Recommend consult to PT/OT if not already done. Early mobility and exercise has been shown to decrease duration of delirium.  4:Provide appropriate lighting and clear signage; a clock and calendar should be easily visible to the patient. 5:Monitor environmental factors. Reduce light and noise at night (close shades, turn off lights, turn off TV, ect). Correct any alterations in sleep cycle. 6: Reorient the patient to person, place, time and situation on each encounter.  7: Correct sensory deficits if possible (replace eye glasses, hearing aids, ect). 8: Avoid restraints. Severely delirious patients benefit from constant observation by a sitter. 9: Do not leave patient unattended.     ## Safety and Observation Level:  - Based on my clinical evaluation, I estimate the patient to be at low risk of self harm in the current setting - At this time, we recommend a regular level of observation. This decision is based on my review of the chart including patient's history and current presentation, interview of the patient, mental status examination, and consideration of suicide risk including evaluating suicidal ideation, plan, intent, suicidal or self-harm behaviors, risk factors, and protective factors. This judgment is based on our ability to directly address suicide risk, implement suicide prevention strategies and develop a safety plan while the patient is in the clinical setting. Please contact our team if there is a concern that risk level has changed.  Suicide risk assessment  Patient has following modifiable risk factors for suicide: social isolation and medication  noncompliance, which we are addressing by communicating with patient's caregiver and adjusting medications.   Patient has following non-modifiable or demographic risk factors for suicide: male gender and psychiatric hospitalization  Patient has the following protective factors against suicide: Supportive friends   Thank you for this consult request. Recommendations have been communicated to the primary team.  We will sign off at this time.   Lance Muss, MD  Psychiatric and Social History   Relevant Aspects of Hospital Course:  Admitted on 01/04/2023 for stroke.   Patient Report:  PRN meds received overnight: Zyprexa 5 mg at 3 AM and Ativan 2 mg at 5 AM   Patient seen this AM at bedside. He was eating breakfast. He notes feeling well and denies issues with sleep and appetite. He denies SI, HI, and AVH. He denies adverse effects from his medication changes. He plans to return to the facility with the assistance of a sitter and his caregiver. Patient feels ready to return and we discussed increasing his Zyprexa today along with stopping his Requip and Ambien.  Psychiatric ROS Mood Symptoms Sleep disturbances (insomnia or hypersomnia)  Manic Symptoms Hypersexuality, impulsiveness   Collateral information:  Contacted [name] (caregiver/power of attorney) at [phone] on 8/21.  She states that the patient has had a progressively steady decline for a long time but noticed a drastic change in his behavior about 2 weeks ago in which she describes the patient  is combative, talking sexually to the staff in his living facility, and smoking in the building when he knows he is not supposed to.  She recently became his power of attorney 2 weeks ago.  She states that she attempts to manage his medications but he is unwilling.  She is unsure if he appropriately takes his prescribed medication and there is a likelihood that he is under or overdosing himself.  She also states that the patient  received a phone call in which she answered and it was a man trying to sell him more Ambien as the man stated that he knew the patient ran out of Ambien.  Psychiatric History:  Information collected from the patient and collateral  Prev Dx/Sx: Bipolar 1 disorder Current Psych Provider: Sees a psychiatrist at the [facility] Current Meds: Trazodone, Wellbutrin, Lexapro, melatonin, Remeron, Seroquel, trazodone Previous Med Trials: Ambien, olanzapine, gabapentin  Prior Psych Hospitalization: Previously was hospitalized in the [place] hospital 08/3021 Prior Self Harm: Denies Prior Violence: Denies  Family Psych History: Denies Family Hx suicide: Denies  Social History:  Living Situation: Previously lived in [place] but not allowed back Access to weapons: Denies    Tobacco use: Yes, 1-2 PPD, would smoke "every 20 mins" if he could  Alcohol use: No  Other Substance use: Unprescribed Ambien use, states taking "20 mg daily"   Exam Findings   Psychiatric Specialty Exam:  Presentation  General Appearance:  Appropriate for Environment  Eye Contact: Good  Speech: Clear and Coherent  Speech Volume: Normal  Handedness: Right   Mood and Affect  Mood: Euphoric  Affect: Appropriate; Congruent   Thought Process  Thought Processes: Coherent  Descriptions of Associations: Intact  Orientation: Full (Time, Place and Person)  Thought Content: Logical  History of Schizophrenia/Schizoaffective disorder: No  Hallucinations:Hallucinations: None  Ideas of Reference: None  Suicidal Thoughts:Suicidal Thoughts: No  Homicidal Thoughts:Homicidal Thoughts: No   Sensorium  Memory: Immediate Fair; Recent Fair; Remote Fair  Judgment: Fair  Insight: Fair   Executive Functions  Concentration: Good  Attention Span: Good  Recall: Good  Fund of Knowledge: Good  Language: Good   Psychomotor Activity  Psychomotor Activity:Psychomotor Activity:  Normal   Assets  Assets: Communication Skills; Desire for Improvement; Financial Resources/Insurance   Sleep  Sleep:Sleep: Good    Physical Exam: Vital signs:  Temp:  [97.6 F (36.4 C)-98.4 F (36.9 C)] 97.9 F (36.6 C) (08/23 1121) Pulse Rate:  [85-97] 85 (08/23 1121) Resp:  [17-18] 18 (08/23 1121) BP: (101-124)/(59-74) 101/59 (08/23 1121) SpO2:  [97 %-100 %] 99 % (08/23 1121) Physical Exam Vitals reviewed.  Constitutional:      General: He is not in acute distress. Cardiovascular:     Rate and Rhythm: Normal rate.  Pulmonary:     Effort: Pulmonary effort is normal.  Neurological:     Mental Status: He is oriented to person, place, and time.  Psychiatric:        Behavior: Behavior is not agitated.     Blood pressure (!) 101/59, pulse 85, temperature 97.9 F (36.6 C), temperature source Oral, resp. rate 18, height 5\' 6"  (1.676 m), SpO2 99%. Body mass index is 22.06 kg/m.   Other History   These have been pulled in through the EMR, reviewed, and updated if appropriate.   Family History:  The patient's family history is not on file.  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 ENDOSCOPY;  Service: Gastroenterology;;   BILIARY STENT PLACEMENT  10/23/2021   Procedure: BILIARY STENT PLACEMENT;  Surgeon: Lemar Lofty., MD;  Location: [facility] ENDOSCOPY;  Service: Gastroenterology;;   BIOPSY  10/23/2021   Procedure: BIOPSY;  Surgeon: Lemar Lofty., MD;  Location: [facility] ENDOSCOPY;  Service: Gastroenterology;;   ERCP N/A 10/23/2021   Procedure: ENDOSCOPIC RETROGRADE CHOLANGIOPANCREATOGRAPHY (ERCP);  Surgeon:  Lemar Lofty., MD;  Location: [facility] ENDOSCOPY;  Service: Gastroenterology;  Laterality: N/A;   ESOPHAGOGASTRODUODENOSCOPY (EGD) WITH PROPOFOL N/A 10/23/2021   Procedure: ESOPHAGOGASTRODUODENOSCOPY (EGD) WITH PROPOFOL;  Surgeon: Meridee Score Netty Starring., MD;  Location: [facility] ENDOSCOPY;  Service: Gastroenterology;  Laterality: N/A;   FINE NEEDLE ASPIRATION  10/23/2021   Procedure: FINE NEEDLE ASPIRATION (FNA) LINEAR;  Surgeon: Lemar Lofty., MD;  Location: [facility] ENDOSCOPY;  Service: Gastroenterology;;   REMOVAL OF STONES  10/23/2021   Procedure: REMOVAL OF SLUDGE;  Surgeon: Lemar Lofty., MD;  Location: [facility] ENDOSCOPY;  Service: Gastroenterology;;   [name]  10/23/2021   Procedure: [name];  Surgeon: Lemar Lofty., MD;  Location: [facility] ENDOSCOPY;  Service: Gastroenterology;;   UPPER ESOPHAGEAL ENDOSCOPIC ULTRASOUND (EUS) N/A 10/23/2021   Procedure: UPPER ESOPHAGEAL ENDOSCOPIC ULTRASOUND (EUS);  Surgeon: Lemar Lofty., MD;  Location: [facility] ENDOSCOPY;  Service: Gastroenterology;  Laterality: N/A;    Medications:   Current Facility-Administered Medications:    aspirin EC tablet 81 mg, 81 mg, Oral, Daily, Lockie Mola, MD, 81 mg at 01/11/23 6387   Chlorhexidine Gluconate Cloth 2 % PADS 6 each, 6 each, Topical, Daily, McDiarmid, Leighton Roach, MD, 6 each at 01/11/23 1050   clopidogrel (PLAVIX) tablet 75 mg, 75 mg, Oral, Daily, Darnelle Spangle B, MD, 75 mg at 01/11/23 5643   divalproex (DEPAKOTE ER) 24 hr tablet 1,000 mg, 1,000 mg, Oral, QHS, Peterson Ao, MD, 1,000 mg at 01/10/23 2055   donepezil (ARICEPT) tablet 5 mg, 5 mg, Oral, q morning, Lockie Mola, MD, 5 mg at 01/11/23 0826   enoxaparin (LOVENOX) injection 40 mg, 40 mg, Subcutaneous, Q24H, Peterson Ao, MD, 40 mg at 01/10/23 2053   feeding supplement (ENSURE ENLIVE / ENSURE PLUS) liquid 237 mL, 237 mL, Oral, TID BM, McDiarmid, Leighton Roach, MD, 237 mL at 01/11/23 1402   levETIRAcetam (KEPPRA) tablet 1,500 mg,  1,500 mg, Oral, BID, Peterson Ao, MD, 1,500 mg at 01/11/23 3295   multivitamin with minerals tablet 1 tablet, 1 tablet, Oral, Daily, McDiarmid, Leighton Roach, MD, 1 tablet at 01/11/23 1884   nicotine (NICODERM CQ - dosed in mg/24 hours) patch 21 mg, 21 mg, Transdermal, Daily, Shitarev, Dimitry, MD, 21 mg at 01/11/23 0827   nicotine polacrilex (NICORETTE) gum 2 mg, 2 mg, Oral, PRN, Lockie Mola, MD, 2 mg at 01/11/23 0833   OLANZapine (ZYPREXA) tablet 7.5 mg, 7.5 mg, Oral, QHS, , Venesha Petraitis B, MD   OLANZapine zydis (ZYPREXA) disintegrating tablet 5 mg, 5 mg, Oral, Q8H PRN, 5 mg at 01/11/23 0328 **AND** [COMPLETED] LORazepam (ATIVAN) tablet 1 mg, 1 mg, Oral, PRN, 1 mg at 01/11/23 0541 **AND** ziprasidone (GEODON) injection 20 mg, 20 mg, Intramuscular, PRN, Lance Muss, MD   polyethylene glycol (MIRALAX / GLYCOLAX) packet 17  g, 17 g, Oral, Daily, Darnelle Spangle B, MD, 17 g at 01/10/23 1004   rosuvastatin (CRESTOR) tablet 5 mg, 5 mg, Oral, QHS, Marvel Plan, MD, 5 mg at 01/10/23 2054   senna (SENOKOT) tablet 8.6 mg, 1 tablet, Oral, QHS, Alicia Amel, MD, 8.6 mg at 01/10/23 2053   sodium chloride (OCEAN) 0.65 % nasal spray 1 spray, 1 spray, Each Nare, PRN, Caro Laroche, DO   sodium chloride flush (NS) 0.9 % injection 10-40 mL, 10-40 mL, Intracatheter, Q12H, McDiarmid, Leighton Roach, MD, 10 mL at 01/11/23 1054   sodium chloride flush (NS) 0.9 % injection 10-40 mL, 10-40 mL, Intracatheter, PRN, McDiarmid, Leighton Roach, MD, 10 mL at 01/08/23 2159   umeclidinium bromide (INCRUSE ELLIPTA) 62.5 MCG/ACT 1 puff, 1 puff, Inhalation, Daily, Lockie Mola, MD, 1 puff at 01/11/23 0739  Allergies: Allergies  Allergen Reactions   Fluoride Other (See Comments)    Seizures and stroke after dental visit from using fluoride paste (per pt)

## 2023-01-11 NOTE — Progress Notes (Signed)
Physical Therapy Treatment  Patient Details Name: Kerry [name] MRN: [id number] DOB: [birth year] Today's Date: 01/11/2023   History of Present Illness Pt is a 77 y.o. male who presented 01/04/23 with chest pain and brief syncopal episode. EKG is without evidence of acute ischemia.  Troponins are without elevation or significant delta. PMH: dementia, Anxiety, Bipolar d/o, OSA , Seizure d/o  pancreatic cancer stage III on Chemo    PT Comments  Pt progressing towards physical therapy goals. Session somewhat limited by perseveration on voiding. Pt hyperfocused on having a bowel movement and spent extended time sitting in the bathroom. Despite large BM, pt demanding laxative and required encouragement to exit the bathroom. When encouraged to perform tasks on his own, pt becomes frustrated, stating "I have caregivers all day long to do this for me." Overall pt mobilizing well with RW for support. Hands on guarding provided for optimal safety. Will continue to follow and progress as able per POC.    If plan is discharge home, recommend the following: Direct supervision/assist for medications management;Direct supervision/assist for financial management;Assist for transportation;Help with stairs or ramp for entrance;Supervision due to cognitive status   Can travel by private vehicle        Equipment Recommendations  None recommended by PT    Recommendations for Other Services       Precautions / Restrictions Precautions Precautions: Fall Precaution Comments: easily agitated Restrictions Weight Bearing Restrictions: No     Mobility  Bed Mobility Overal bed mobility: Modified Independent             General bed mobility comments: Increased time to transition to EOB.    Transfers Overall transfer level: Needs assistance Equipment used: None Transfers: Sit to/from Stand Sit to Stand: Supervision           General transfer comment: Pt with increased time to power up to full  stand. Maintains a flexed trunk throughout transfers. No assist required but close supervision required for safety.    Ambulation/Gait Ambulation/Gait assistance: Contact guard assist Gait Distance (Feet): 25 Feet Assistive device: 1 person hand held assist, Rolling walker (2 wheels) Gait Pattern/deviations: Step-through pattern, Decreased stride length, Trunk flexed, Drifts right/left Gait velocity: Decreased Gait velocity interpretation: <1.8 ft/sec, indicate of risk for recurrent falls   General Gait Details: HHA and RW use around room. Focus was to bathroom, sink, then back to recliner.   Stairs             Wheelchair Mobility     Tilt Bed    Modified Rankin (Stroke Patients Only) Modified Rankin (Stroke Patients Only) Pre-Morbid Rankin Score: No symptoms Modified Rankin: Slight disability     Balance Overall balance assessment: Mild deficits observed, not formally tested                                          Cognition Arousal: Alert Behavior During Therapy: Restless, Impulsive, Agitated Overall Cognitive Status: History of cognitive impairments - at baseline                                          Exercises      General Comments        Pertinent Vitals/Pain Pain Assessment Pain Assessment: No/denies pain Faces Pain Scale: No hurt Pain Intervention(s): Monitored  during session    Home Living                          Prior Function            PT Goals (current goals can now be found in the care plan section) Acute Rehab PT Goals Patient Stated Goal: None stated, hyperfocused on having a BM this session. PT Goal Formulation: With patient Time For Goal Achievement: 01/19/23 Potential to Achieve Goals: Good Progress towards PT goals: Progressing toward goals    Frequency    Min 1X/week      PT Plan      Co-evaluation              AM-PAC PT "6 Clicks" Mobility   Outcome  Measure  Help needed turning from your back to your side while in a flat bed without using bedrails?: None Help needed moving from lying on your back to sitting on the side of a flat bed without using bedrails?: None Help needed moving to and from a bed to a chair (including a wheelchair)?: A Little Help needed standing up from a chair using your arms (e.g., wheelchair or bedside chair)?: A Little Help needed to walk in hospital room?: A Little Help needed climbing 3-5 steps with a railing? : A Little 6 Click Score: 20    End of Session Equipment Utilized During Treatment: Gait belt Activity Tolerance: Patient tolerated treatment well Patient left: in bed;with call bell/phone within reach;with bed alarm set Nurse Communication: Mobility status PT Visit Diagnosis: Unsteadiness on feet (R26.81);Other abnormalities of gait and mobility (R26.89);History of falling (Z91.81)     Time: 8657-8469 PT Time Calculation (min) (ACUTE ONLY): 33 min  Charges:    $Gait Training: 8-22 mins $Therapeutic Activity: 8-22 mins PT General Charges $$ ACUTE PT VISIT: 1 Visit                     Conni Slipper, PT, DPT Acute Rehabilitation Services Secure Chat Preferred Office: [phone]    Marylynn Pearson 01/11/2023, 2:32 PM

## 2023-01-11 NOTE — Assessment & Plan Note (Signed)
Admitted for inpatient workup.  MR brain with acute infarcts involving the right insula, temporoparietal cortex, and medial frontal cortex without evidence of hemorrhage. CTA Head/Neck with no intracranial large vessel occlusion or significant stenosis. Mild stenosis to the right supraclinoid ICA. - Neuro recs: continue statin, ASA + Plavix for 3 weeks, get 30 day cardiac monitoring outpatient to r/o cardioembolic source, signed off  - Continue home meds Depakote and Keppra  - PT recs HH PT, OT no need therapy at d/c, SLP: will work with acutely  - LE DVT: Negative for DVT

## 2023-01-11 NOTE — Assessment & Plan Note (Addendum)
K 3.5 this AM, - AM BMP

## 2023-01-11 NOTE — Assessment & Plan Note (Addendum)
Recent unsafe behavior at [facility] - they would accept him back only if he had a sitter, leaving lit cigarettes. No acute events or agitation overnight. Started zyprexa, ambien taper and continue divalproex. Discontinued Seroquel, mirtazapine , trazodone to avoid oversedating meds.  - Psych c/s: discontinue ropinirole and ambien taper.  - Increased Zyprexa 7.5 mg daily at bedtime Agitation protocol:  - zyprexa 5 mg prn q8h x1  yesterday  - ativan 1 mg prn  - geodon 20 mg IM prn  - Divalproex 1000 mg at bedtime, VPA level therapeutic  - Delirium Precautions

## 2023-01-11 NOTE — Assessment & Plan Note (Signed)
CT concerning for acute MCA infarct. No focal deficits on exam.  - Admit to FMTS, Med-tele,Attending Dr. McDiarmid  - Neurology consulted, appreciate recommendations  - F/u MRI, CTA  - Started aspirin 81 and plavix  - Continue home Crestor  - Neuro checks q 4 hours  - PT/ OT treat and Eval

## 2023-01-11 NOTE — Assessment & Plan Note (Addendum)
Chronic smoker 1-2 PPD history - Nicotine patch and nicorette ordered  - Nicoine patch on

## 2023-01-11 NOTE — Assessment & Plan Note (Signed)
 Prior history of seizure disorder as well, Home medications divalproex 1000  mg at bedtime   divalproex 1000 mg at bedtime, VPA therapeutic level

## 2023-01-11 NOTE — Progress Notes (Addendum)
Daily Progress Note Intern Pager: [phone]  Patient name: Kerry [name] Medical record number: [id number] Date of birth: 11/21/1945 Age: 77 y.o. Gender: male  Primary Care Provider: [facility] Consultants: Psychiatry Code Status: Full Code  Pt Overview and Major Events to Date:  8/19 Admitted to FMTS for stroke  8/20 Increased night time agitation, required haldol and ativan 8/21 Psych c/s started on zyprexa, d/c other home meds 8/22 Caregiver can arrange sitter for dispo planning when medically stable  8/23 Increased zyprexa to 7.5 mg daily, discontinued ambien and ropinirole    Kerry [name] is a 77 y.o. 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 Neurology following and medications adjustments. Patient tolerating zyprexa well and will increase to 7.5 mg daily. Patients behavior much better improved after recs. Discontinuing ambien and ropinirole. Awaiting dispo placement, POA can arrange pick up tomorrow and sitters.  Assessment & Plan Stroke [facility]) Admitted for inpatient workup.  MR brain with acute infarcts involving the right insula, temporoparietal cortex, and medial frontal cortex without evidence of hemorrhage. CTA Head/Neck with no intracranial large vessel occlusion or significant stenosis. Mild stenosis to the right supraclinoid ICA. - Neuro recs: continue statin, ASA + Plavix for 3 weeks, get 30 day cardiac monitoring outpatient to r/o cardioembolic source, signed off  - Continue home meds Depakote and Keppra  - PT recs HH PT, OT no need therapy at d/c, SLP: will work with acutely  - LE DVT: Negative for DVT Dementia with behavioral disturbance (HCC) Recent unsafe behavior at [facility] - they would accept him back only if he had a sitter, leaving lit cigarettes. No acute events or agitation overnight. Started zyprexa, ambien taper and continue divalproex. Discontinued Seroquel, mirtazapine , trazodone to avoid oversedating meds.  - Psych c/s:  discontinue ropinirole and ambien taper.  - Increased Zyprexa 7.5 mg daily at bedtime Agitation protocol:  - zyprexa 5 mg prn q8h x1  yesterday  - ativan 1 mg prn  - geodon 20 mg IM prn  - Divalproex 1000 mg at bedtime, VPA level therapeutic  - Delirium Precautions  Bipolar 1 disorder (HCC) Prior history of seizure disorder as well, Home medications divalproex 1000  mg at bedtime   divalproex 1000 mg at bedtime, VPA therapeutic level  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 Chronic smoker 1-2 PPD history - Nicotine patch and nicorette ordered  - Nicoine patch on Hypokalemia K 3.5 this AM, - AM BMP  Support system deficit Patient has POA, who helps patient, can be reached on the phone after 9 AM - [name], [phone], pt's POA, sitter not arranged for today, would be able to arrange for tomorrow and pick up patient in the morning. Protein-calorie malnutrition, severe Continue reg diet  Ensure BID  MVI   FEN/GI: Regular  PPx: Lovenox Dispo:Home  d/c tomorrow pending sitter availability .  Subjective:  Patient well this morning. No complaints. Would like to leave today if possible. Told would attempt if sitter was available.   Objective: Temp:  [97.6 F (36.4 C)-98.4 F (36.9 C)] 97.6 F (36.4 C) (08/23 0408) Pulse Rate:  [70-97] 85 (08/23 0739) Resp:  [16-18] 18 (08/23 0739) BP: (105-126)/(63-76) 119/63 (08/23 0408) SpO2:  [97 %-100 %] 99 % (08/23 0739)  Physical Exam: General: Alert, sitting up   Cardiovascular: RRR, no murmurs appreciated  Respiratory: CTAB  in the anterior fields Abdomen: tender to palpation in epigastric region, location of pancreastic cancer, active bowel sounds Extremities: No  lower extremity edema  Neuro: Alert and Oriented x3, conversational, No SI/HI/AVH  Laboratory: Most recent CBC Lab Results  Component Value Date   WBC 3.5 (L) 01/10/2023   HGB 9.7 (L) 01/10/2023   HCT 29.6 (L) 01/10/2023   MCV 91.6 01/10/2023   PLT 93 (L)  01/10/2023   Most recent BMP    Latest Ref Rng & Units 01/11/2023    5:45 AM  BMP  Glucose 70 - 99 mg/dL 88   BUN 8 - 23 mg/dL 14   Creatinine 4.09 - 1.24 mg/dL 8.11   Sodium 914 - 782 mmol/L 136   Potassium 3.5 - 5.1 mmol/L 3.5   Chloride 98 - 111 mmol/L 102   CO2 22 - 32 mmol/L 26   Calcium 8.9 - 10.3 mg/dL 8.7     Other pertinent labs None    Imaging/Diagnostic Tests: CT Head w/o contrast:    IMPRESSION: 1. 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 No hemorrhage. 2. Dense M2/M3 branch on the right, which could represent thrombus.   CT Angio    IMPRESSION: 1. No intracranial large vessel occlusion or significant stenosis. Mild stenosis in the right supraclinoid ICA. 2. No hemodynamically significant stenosis in the neck. 3. Aortic atherosclerosis.   MRI wo contrast Acute infarcts involving the right insula, temporoparietal cortex, and medial frontal cortex. No evidence of hemorrhage.  Peterson Ao, MD 01/11/2023, 7:49 AM  PGY-1, [facility] FPTS Intern pager: [phone], text pages welcome Secure chat group [facility] Teaching Service

## 2023-01-11 NOTE — Assessment & Plan Note (Addendum)
Patient has POA, who helps patient, can be reached on the phone after 9 AM - Kerry [name], [phone], pt's POA, sitter not arranged for today, would be able to arrange for tomorrow and pick up patient in the morning.

## 2023-01-11 NOTE — Assessment & Plan Note (Signed)
 Continue reg diet  Ensure BID  MVI

## 2023-01-12 ENCOUNTER — Other Ambulatory Visit (HOSPITAL_COMMUNITY): Payer: Self-pay

## 2023-01-12 DIAGNOSIS — I6340 Cerebral infarction due to embolism of unspecified cerebral artery: Secondary | ICD-10-CM | POA: Diagnosis not present

## 2023-01-12 DIAGNOSIS — F0390 Unspecified dementia without behavioral disturbance: Secondary | ICD-10-CM | POA: Diagnosis not present

## 2023-01-12 DIAGNOSIS — F03918 Unspecified dementia, unspecified severity, with other behavioral disturbance: Secondary | ICD-10-CM | POA: Diagnosis not present

## 2023-01-12 DIAGNOSIS — F319 Bipolar disorder, unspecified: Secondary | ICD-10-CM | POA: Diagnosis not present

## 2023-01-12 MED ORDER — HEPARIN SOD (PORK) LOCK FLUSH 100 UNIT/ML IV SOLN: 500.0000 [IU] | INTRAVENOUS | Status: DC | PRN

## 2023-01-12 MED ORDER — UMECLIDINIUM BROMIDE 62.5 MCG/ACT IN AEPB: 1.0000 | INHALATION_SPRAY | Freq: Every day | RESPIRATORY_TRACT | 0 refills | Status: AC

## 2023-01-12 MED ORDER — CLOPIDOGREL BISULFATE 75 MG PO TABS: 75.0000 mg | ORAL_TABLET | Freq: Every day | ORAL | 0 refills | Status: AC

## 2023-01-12 MED ORDER — OLANZAPINE 10 MG PO TABS
10.0000 mg | ORAL_TABLET | Freq: Every day | ORAL | 0 refills | Status: AC
  Filled 2023-01-12: qty 30, 30d supply, fill #0

## 2023-01-12 MED ORDER — NICOTINE POLACRILEX 2 MG MT GUM: 2.0000 mg | CHEWING_GUM | OROMUCOSAL | 0 refills | Status: AC | PRN

## 2023-01-12 MED ORDER — NICOTINE POLACRILEX 2 MG MT GUM: 2.0000 mg | CHEWING_GUM | OROMUCOSAL | 0 refills | Status: DC | PRN

## 2023-01-12 MED ORDER — NICOTINE 21 MG/24HR TD PT24: 21.0000 mg | MEDICATED_PATCH | Freq: Every day | TRANSDERMAL | 0 refills | Status: AC

## 2023-01-12 NOTE — Discharge Summary (Signed)
Family Medicine Teaching [facility] Discharge Summary  Patient name: Kerry [name] Medical record number: [id number] Date of birth: 21-Sep-1945 Age: 77 y.o. Gender: male Date of Admission: 01/04/2023  Date of Discharge: 01/12/23 Admitting Physician: Peterson Ao, MD  Primary Care Provider: [facility] Consultants: Neurology, Psychiatry  Indication for Hospitalization: dysarthria, found to have acute CVA  Brief Hospital Course:  Kerry [name] is a 77 y.o.male with a history of pancreatic cancer, dementia, bipolar 1, epilepsy, tobacco use disorder who was admitted to the [facility] Medicine Teaching Service at [facility] for dysarthria with acute MCA infarct. His hospital course is detailed below:  Dysarthria s/t MCA stroke Pt presented with slurred speech and intermittent episodes of "slurred speech, eyes fluttering, and dizziness and weakness." He was concerned that he was having mini-strokes. CT head wo contrast showed acute infarct in the right MCA territory involving the insula with no hemorrhage. MRI brain showed acute infarcts involving the right insula, temporoparietal cortex, and medial frontal cortex with no evidence of hemorrhage. Plavix 75mg  daily was started. Neurology recommended both aspirin 81 and plavix for 3 weeks, followed by just aspirin 81.  He was started on Crestor 5 mg for dyslipidemia.  Delirium Patient was delirious multiple nights during admission requiring haldol and ativan. The delirium is complicated by the patients baseline dementia and Bipolar 1 disorder.  Psychiatry recommended discontinuing some of his home medications (seroquel, trazodone, ropinirole, mirtazapine and melatonin).  Given patient's illicit Ambien use prior (20 mg at bedtime) to admission he was started on an Ambien taper (2.5 mg at bedtime) and discontinued on 8/23.  He was started on Zyprexa nightly which was ultimately increased to 10 mg nightly at the time of discharge.    Anemia Hemoglobin at 9.0, with decreased TIBC, suspected iron deficiency anemia in the setting of pancreatic cancer. Patient was offered supplementation however declined.   Other chronic conditions were medically managed with home medications and formulary alternatives as necessary (seizure disorder, Bipolar 1) Bipolar:  Depakote 1000 at bedtime Seizures:  Keppra 1500 twice per day Dementia ropinirole 1 mg>> 0.5(discontinued per psych's recs) and donepezil 5 mg qAM  PCP Follow-up Recommendations: Neurology recommends 30 day cardiac event monitor to rule out Afib given possible embolic stroke.  He has been demonstrating some inappropriate behaviors, would advise that he have a safety sitter/monitor at his facility until these resolve.  Please consider PFTs to evaluate for COPD in this lifelong smoker Follow up transition from DAPT to aspirin monotherapy after 3 weeks. Recommend collecting CBC on follow up due to anemia     Disposition: Back to independent living facility with   Discharge Condition: Stable, at baseline  Discharge Exam:  Gen: Sleeping on arrival, awakens to verbal and tactile stimuli Cardio: Regular rate and rhythm, without murmur Pulm: Normal WOB on RA, lungs clear in all fields  Abd: Non-tender and non-distended Ext: Without edema or deformity Neuro: Minimal dysarthria, moves all extremities independently Psych: Mood and affect are appropriate to situation, eager to leave  Significant Procedures: None   Significant Labs and Imaging:  Recent Labs  Lab 01/10/23 0505  WBC 3.5*  HGB 9.7*  HCT 29.6*  PLT 93*   Recent Labs  Lab 01/10/23 0505 01/11/23 0545  NA 140 136  K 4.0 3.5  CL 106 102  CO2 26 26  GLUCOSE 111* 88  BUN 11 14  CREATININE 0.72 0.66  CALCIUM 8.8* 8.7*    CT Head 8/19 FINDINGS: Brain: There is an acute infarcts  in the right MCA territory involving the insula. No hemorrhage. No hydrocephalus. No extra-axial fluid collection.  Sequela of mild chronic microvascular ischemic change.   Vascular: Dense MCA on the right, in the region of the M2/M3 segments.   Skull: Normal. Negative for fracture or focal lesion. Dermal calcifications.   Sinuses/Orbits: No middle ear or mastoid effusion. Paranasal sinuses are clear. Bilateral lens replacement. Orbits are otherwise unremarkable.   Other: None.   IMPRESSION: 1. 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 No hemorrhage. 2. Dense M2/M3 branch on the right, which could represent thrombus.   MR Brain 8/19 FINDINGS: Brain: Restricted diffusion with ADC correlate in the right MCA territory, primarily involving the insula (series 2, images 23-30) and right temporoparietal cortex (series 2, images 26-36.) Additional restricted diffusion with ADC correlate in the medial right frontal cortex (series 2, image 39). These areas are associated with mildly increased T2 hyperintense signal and gyral swelling. No evidence of associated hemorrhage.   No mass, mass effect, or midline shift. No hydrocephalus or extra-axial collection.   No hemosiderin deposition to suggest remote hemorrhage.   Vascular: Normal arterial flow voids.   Skull and upper cervical spine: Normal marrow signal.   Sinuses/Orbits: Clear paranasal sinuses. No acute finding in the orbits. Status post bilateral lens replacements.   Other: The mastoid air cells are well aerated.   IMPRESSION: Acute infarcts involving the right insula, temporoparietal cortex, and medial frontal cortex. No evidence of hemorrhage.  CTA Head and Neck CTA NECK FINDINGS   Aortic arch: Standard branching. Imaged portion shows no evidence of aneurysm or dissection. No significant stenosis of the major arch vessel origins. Mild aortic atherosclerosis, with noncalcified plaque extending to the origin of brachiocephalic artery   Right carotid system: No evidence of dissection, occlusion,  or hemodynamically significant stenosis (greater than 50%).   Left carotid system: No evidence of dissection, occlusion, or hemodynamically significant stenosis (greater than 50%).   Vertebral arteries: No evidence of dissection, occlusion, or hemodynamically significant stenosis (greater than 50%).   Skeleton: No acute osseous abnormality. Degenerative changes in the cervical spine.   Other neck: No acute finding.   Upper chest: No focal pulmonary opacity or pleural effusion. Emphysema. Right chest port.   Review of the MIP images confirms the above findings   CTA HEAD FINDINGS   Anterior circulation: Both internal carotid arteries are patent to the termini, with mild stenosis in the right supraclinoid ICA (series 7, image 124).   A1 segments patent. Normal anterior communicating artery. Anterior cerebral arteries are patent to their distal aspects without significant stenosis.   No M1 stenosis or occlusion. MCA branches perfused to their distal aspects without significant stenosis.   Posterior circulation: Vertebral arteries patent to the vertebrobasilar junction without significant stenosis. Posterior inferior cerebellar arteries patent on the left.   Basilar patent to its distal aspect without significant stenosis. Superior cerebellar arteries patent proximally.   Patent P1 segments. PCAs perfused to their distal aspects without significant stenosis. There is likely a diminutive left posterior communicating artery.   Venous sinuses: As permitted by contrast timing, patent.   Anatomic variants: None significant.   Review of the MIP images confirms the above findings   IMPRESSION: 1. No intracranial large vessel occlusion or significant stenosis. Mild stenosis in the right supraclinoid ICA. 2. No hemodynamically significant stenosis in the neck. 3. Aortic atherosclerosis.    Results/Tests Pending at Time of Discharge: None  Discharge Medications:   Allergies as  of 01/12/2023       Reactions   Fluoride Other (See Comments)   Seizures and stroke after dental visit from using fluoride paste (per pt)        Medication List     STOP taking these medications    buPROPion 150 MG 24 hr tablet Commonly known as: WELLBUTRIN XL   cephALEXin 500 MG capsule Commonly known as: KEFLEX   escitalopram 10 MG tablet Commonly known as: LEXAPRO   ferrous gluconate 324 MG tablet Commonly known as: FERGON   melatonin 3 MG Tabs tablet   mirtazapine 15 MG tablet Commonly known as: REMERON   pantoprazole 40 MG tablet Commonly known as: PROTONIX   potassium chloride 10 MEQ tablet Commonly known as: KLOR-CON   QUEtiapine 200 MG tablet Commonly known as: SEROQUEL   rOPINIRole 1 MG tablet Commonly known as: REQUIP   traZODone 150 MG tablet Commonly known as: DESYREL       TAKE these medications    ascorbic acid 500 MG tablet Commonly known as: VITAMIN C Take 500 mg by mouth daily.   aspirin EC 81 MG tablet Take 1 tablet (81 mg total) by mouth daily.   clopidogrel 75 MG tablet Commonly known as: PLAVIX Take 1 tablet (75 mg total) by mouth daily for 17 days.   divalproex 500 MG 24 hr tablet Commonly known as: DEPAKOTE ER Take 2 tablets (1,000 mg total) by mouth at bedtime.   donepezil 5 MG tablet Commonly known as: ARICEPT Take 1 tablet (5 mg total) by mouth every morning. What changed:  medication strength how much to take additional instructions   feeding supplement Liqd Take 237 mLs by mouth 3 (three) times daily between meals.   finasteride 5 MG tablet Commonly known as: PROSCAR Take 5 mg by mouth daily. For overactive bladder   levETIRAcetam 750 MG tablet Commonly known as: KEPPRA Take 2 tablets (1,500 mg total) by mouth 2 (two) times daily.   multivitamin with minerals Tabs tablet Take 1 tablet by mouth daily.   nicotine 21 mg/24hr patch Commonly known as: NICODERM CQ - dosed in mg/24 hours Place  1 patch (21 mg total) onto the skin daily.   nicotine polacrilex 2 MG gum Commonly known as: NICORETTE Take 1 each (2 mg total) by mouth as needed for smoking cessation.   OLANZapine zydis 5 MG disintegrating tablet Commonly known as: ZYPREXA Take 1 tablet (5 mg total) by mouth every 8 (eight) hours as needed (agitation).   OLANZapine 10 MG tablet Commonly known as: ZYPREXA Take 1 tablet (10 mg total) by mouth at bedtime.   rosuvastatin 10 MG tablet Commonly known as: CRESTOR Take 0.5 tablets (5 mg total) by mouth at bedtime. For cholesterol   umeclidinium bromide 62.5 MCG/ACT Aepb Commonly known as: INCRUSE ELLIPTA Inhale 1 puff into the lungs daily.   Vitamin D 50 MCG (2000 UT) tablet Take 2,000 Units by mouth at bedtime.        Discharge Instructions: Please refer to Patient Instructions section of EMR for full details.  Patient was counseled important signs and symptoms that should prompt return to medical care, changes in medications, dietary instructions, activity restrictions, and follow up appointments.   Follow-Up Appointments:  Follow-up Information     [facility] .   Why: Go to your regularly scheduled appointment on 8/27 Contact information: [address] [facility] [place] [address] [phone]         [facility]. Schedule  an appointment as soon as possible for a visit in 1 month(s).   Specialty: Neurology Why: stroke clinic Contact information: [address] [place] [address] [phone]                Alicia Amel, MD 01/12/2023, 1:03 AM PGY-3, [facility]

## 2023-01-12 NOTE — Progress Notes (Signed)
Discharge instructions given to caregiver [name] at besides to include medications, prescriptions and follow appointments. She verbalized understanding of instructions.

## 2023-01-12 NOTE — TOC Transition Note (Signed)
Discharge medications are filled and stored in the [facility] awaiting patient discharge.  ** WILL NEED TO BE PICKED UP **

## 2023-01-14 ENCOUNTER — Emergency Department (HOSPITAL_COMMUNITY): Payer: No Typology Code available for payment source

## 2023-01-14 ENCOUNTER — Inpatient Hospital Stay (HOSPITAL_COMMUNITY)
Admission: EM | Admit: 2023-01-14 | Discharge: 2023-01-24 | DRG: 884 | Discharge status: Against Medical Advice | Payer: No Typology Code available for payment source | Attending: Family Medicine | Admitting: Family Medicine

## 2023-01-14 ENCOUNTER — Other Ambulatory Visit: Payer: Self-pay | Admitting: Student

## 2023-01-14 ENCOUNTER — Other Ambulatory Visit: Payer: Self-pay

## 2023-01-14 ENCOUNTER — Encounter (HOSPITAL_COMMUNITY): Payer: Self-pay | Admitting: Family Medicine

## 2023-01-14 DIAGNOSIS — E870 Hyperosmolality and hypernatremia: Secondary | ICD-10-CM | POA: Diagnosis present

## 2023-01-14 DIAGNOSIS — R410 Disorientation, unspecified: Secondary | ICD-10-CM | POA: Diagnosis present

## 2023-01-14 DIAGNOSIS — N400 Enlarged prostate without lower urinary tract symptoms: Secondary | ICD-10-CM | POA: Diagnosis present

## 2023-01-14 DIAGNOSIS — Z658 Other specified problems related to psychosocial circumstances: Secondary | ICD-10-CM

## 2023-01-14 DIAGNOSIS — G40909 Epilepsy, unspecified, not intractable, without status epilepticus: Secondary | ICD-10-CM | POA: Diagnosis present

## 2023-01-14 DIAGNOSIS — Z7902 Long term (current) use of antithrombotics/antiplatelets: Secondary | ICD-10-CM

## 2023-01-14 DIAGNOSIS — F319 Bipolar disorder, unspecified: Secondary | ICD-10-CM | POA: Diagnosis present

## 2023-01-14 DIAGNOSIS — C259 Malignant neoplasm of pancreas, unspecified: Secondary | ICD-10-CM | POA: Diagnosis present

## 2023-01-14 DIAGNOSIS — E871 Hypo-osmolality and hyponatremia: Secondary | ICD-10-CM | POA: Diagnosis present

## 2023-01-14 DIAGNOSIS — Z8249 Family history of ischemic heart disease and other diseases of the circulatory system: Secondary | ICD-10-CM

## 2023-01-14 DIAGNOSIS — R4689 Other symptoms and signs involving appearance and behavior: Secondary | ICD-10-CM | POA: Diagnosis not present

## 2023-01-14 DIAGNOSIS — Z515 Encounter for palliative care: Secondary | ICD-10-CM | POA: Diagnosis not present

## 2023-01-14 DIAGNOSIS — Z7982 Long term (current) use of aspirin: Secondary | ICD-10-CM

## 2023-01-14 DIAGNOSIS — Z66 Do not resuscitate: Secondary | ICD-10-CM | POA: Diagnosis present

## 2023-01-14 DIAGNOSIS — Z79899 Other long term (current) drug therapy: Secondary | ICD-10-CM

## 2023-01-14 DIAGNOSIS — F17200 Nicotine dependence, unspecified, uncomplicated: Secondary | ICD-10-CM | POA: Diagnosis not present

## 2023-01-14 DIAGNOSIS — F03B3 Unspecified dementia, moderate, with mood disturbance: Secondary | ICD-10-CM | POA: Diagnosis present

## 2023-01-14 DIAGNOSIS — Z7189 Other specified counseling: Secondary | ICD-10-CM | POA: Diagnosis not present

## 2023-01-14 DIAGNOSIS — C787 Secondary malignant neoplasm of liver and intrahepatic bile duct: Secondary | ICD-10-CM | POA: Diagnosis present

## 2023-01-14 DIAGNOSIS — F03B18 Unspecified dementia, moderate, with other behavioral disturbance: Secondary | ICD-10-CM | POA: Diagnosis present

## 2023-01-14 DIAGNOSIS — Z5329 Procedure and treatment not carried out because of patient's decision for other reasons: Secondary | ICD-10-CM | POA: Diagnosis present

## 2023-01-14 DIAGNOSIS — Z888 Allergy status to other drugs, medicaments and biological substances status: Secondary | ICD-10-CM

## 2023-01-14 DIAGNOSIS — E860 Dehydration: Secondary | ICD-10-CM | POA: Diagnosis present

## 2023-01-14 DIAGNOSIS — F03918 Unspecified dementia, unspecified severity, with other behavioral disturbance: Secondary | ICD-10-CM | POA: Diagnosis not present

## 2023-01-14 DIAGNOSIS — E039 Hypothyroidism, unspecified: Secondary | ICD-10-CM | POA: Diagnosis present

## 2023-01-14 DIAGNOSIS — F03B0 Unspecified dementia, moderate, without behavioral disturbance, psychotic disturbance, mood disturbance, and anxiety: Secondary | ICD-10-CM | POA: Diagnosis not present

## 2023-01-14 DIAGNOSIS — Z8673 Personal history of transient ischemic attack (TIA), and cerebral infarction without residual deficits: Secondary | ICD-10-CM

## 2023-01-14 DIAGNOSIS — Z800 Family history of malignant neoplasm of digestive organs: Secondary | ICD-10-CM

## 2023-01-14 DIAGNOSIS — E162 Hypoglycemia, unspecified: Secondary | ICD-10-CM | POA: Diagnosis present

## 2023-01-14 DIAGNOSIS — K5900 Constipation, unspecified: Secondary | ICD-10-CM

## 2023-01-14 DIAGNOSIS — Z638 Other specified problems related to primary support group: Secondary | ICD-10-CM

## 2023-01-14 DIAGNOSIS — G4700 Insomnia, unspecified: Secondary | ICD-10-CM | POA: Diagnosis present

## 2023-01-14 DIAGNOSIS — E785 Hyperlipidemia, unspecified: Secondary | ICD-10-CM | POA: Diagnosis present

## 2023-01-14 DIAGNOSIS — I10 Essential (primary) hypertension: Secondary | ICD-10-CM | POA: Diagnosis present

## 2023-01-14 DIAGNOSIS — E872 Acidosis, unspecified: Secondary | ICD-10-CM | POA: Diagnosis present

## 2023-01-14 DIAGNOSIS — F17210 Nicotine dependence, cigarettes, uncomplicated: Secondary | ICD-10-CM | POA: Diagnosis present

## 2023-01-14 DIAGNOSIS — F419 Anxiety disorder, unspecified: Secondary | ICD-10-CM | POA: Diagnosis present

## 2023-01-14 LAB — CBC WITH DIFFERENTIAL/PLATELET
Abs Immature Granulocytes: 0.02 10*3/uL (ref 0.00–0.07)
Basophils Absolute: 0 10*3/uL (ref 0.0–0.1)
Basophils Relative: 1 %
Eosinophils Absolute: 0.1 10*3/uL (ref 0.0–0.5)
Eosinophils Relative: 2 %
HCT: 33.2 % — ABNORMAL LOW (ref 39.0–52.0)
Hemoglobin: 10.8 g/dL — ABNORMAL LOW (ref 13.0–17.0)
Immature Granulocytes: 0 %
Lymphocytes Relative: 23 %
Lymphs Abs: 1.5 10*3/uL (ref 0.7–4.0)
MCH: 29.8 pg (ref 26.0–34.0)
MCHC: 32.5 g/dL (ref 30.0–36.0)
MCV: 91.7 fL (ref 80.0–100.0)
Monocytes Absolute: 0.7 10*3/uL (ref 0.1–1.0)
Monocytes Relative: 11 %
Neutro Abs: 4.1 10*3/uL (ref 1.7–7.7)
Neutrophils Relative %: 63 %
Platelets: 108 10*3/uL — ABNORMAL LOW (ref 150–400)
RBC: 3.62 MIL/uL — ABNORMAL LOW (ref 4.22–5.81)
RDW: 14.2 % (ref 11.5–15.5)
WBC: 6.4 10*3/uL (ref 4.0–10.5)
nRBC: 0 % (ref 0.0–0.2)

## 2023-01-14 LAB — URINALYSIS, W/ REFLEX TO CULTURE (INFECTION SUSPECTED)
Bacteria, UA: NONE SEEN
Bilirubin Urine: NEGATIVE
Glucose, UA: NEGATIVE mg/dL
Hgb urine dipstick: NEGATIVE
Ketones, ur: 20 mg/dL — AB
Leukocytes,Ua: NEGATIVE
Nitrite: NEGATIVE
Protein, ur: NEGATIVE mg/dL
Specific Gravity, Urine: 1.021 (ref 1.005–1.030)
pH: 5 (ref 5.0–8.0)

## 2023-01-14 LAB — COMPREHENSIVE METABOLIC PANEL
ALT: 21 U/L (ref 0–44)
AST: 26 U/L (ref 15–41)
Albumin: 3.4 g/dL — ABNORMAL LOW (ref 3.5–5.0)
Alkaline Phosphatase: 66 U/L (ref 38–126)
Anion gap: 15 (ref 5–15)
BUN: 24 mg/dL — ABNORMAL HIGH (ref 8–23)
CO2: 20 mmol/L — ABNORMAL LOW (ref 22–32)
Calcium: 9.3 mg/dL (ref 8.9–10.3)
Chloride: 103 mmol/L (ref 98–111)
Creatinine, Ser: 0.72 mg/dL (ref 0.61–1.24)
GFR, Estimated: >60 mL/min (ref >60)
Glucose, Bld: 105 mg/dL — ABNORMAL HIGH (ref 70–99)
Potassium: 3.8 mmol/L (ref 3.5–5.1)
Sodium: 138 mmol/L (ref 135–145)
Total Bilirubin: 0.7 mg/dL (ref 0.3–1.2)
Total Protein: 7 g/dL (ref 6.5–8.1)

## 2023-01-14 LAB — LIPASE, BLOOD: Lipase: 17 U/L (ref 11–51)

## 2023-01-14 LAB — AMMONIA: Ammonia: 74 umol/L — ABNORMAL HIGH (ref 9–35)

## 2023-01-14 MED ORDER — DONEPEZIL HCL 10 MG PO TABS
5.0000 mg | ORAL_TABLET | Freq: Every morning | ORAL | Status: DC
  Administered 2023-01-15 – 2023-01-24 (×10): 5 mg via ORAL
  Filled 2023-01-14 (×10): qty 1

## 2023-01-14 MED ORDER — VITAMIN D 25 MCG (1000 UNIT) PO TABS
2000.0000 [IU] | ORAL_TABLET | Freq: Every day | ORAL | Status: DC
  Administered 2023-01-14 – 2023-01-23 (×10): 2000 [IU] via ORAL
  Filled 2023-01-14 (×10): qty 2

## 2023-01-14 MED ORDER — NICOTINE POLACRILEX 2 MG MT GUM: 2.0000 mg | CHEWING_GUM | OROMUCOSAL | Status: DC | PRN

## 2023-01-14 MED ORDER — NICOTINE 21 MG/24HR TD PT24
21.0000 mg | MEDICATED_PATCH | Freq: Every day | TRANSDERMAL | Status: DC
  Administered 2023-01-14 – 2023-01-24 (×12): 21 mg via TRANSDERMAL
  Filled 2023-01-14 (×12): qty 1

## 2023-01-14 MED ORDER — ASPIRIN 81 MG PO TBEC
81.0000 mg | DELAYED_RELEASE_TABLET | Freq: Every day | ORAL | Status: DC
  Administered 2023-01-14 – 2023-01-24 (×11): 81 mg via ORAL
  Filled 2023-01-14 (×11): qty 1

## 2023-01-14 MED ORDER — OLANZAPINE 5 MG PO TBDP
5.0000 mg | ORAL_TABLET | Freq: Three times a day (TID) | ORAL | Status: DC | PRN
  Administered 2023-01-15 (×3): 5 mg via ORAL
  Filled 2023-01-14 (×6): qty 1

## 2023-01-14 MED ORDER — DIVALPROEX SODIUM ER 500 MG PO TB24
1000.0000 mg | ORAL_TABLET | Freq: Every day | ORAL | Status: DC
  Administered 2023-01-14 – 2023-01-23 (×10): 1000 mg via ORAL
  Filled 2023-01-14 (×12): qty 2

## 2023-01-14 MED ORDER — ZOLPIDEM TARTRATE 5 MG PO TABS
2.5000 mg | ORAL_TABLET | Freq: Once | ORAL | Status: AC
  Administered 2023-01-14: 2.5 mg via ORAL
  Filled 2023-01-14: qty 1

## 2023-01-14 MED ORDER — VITAMIN C 500 MG PO TABS
500.0000 mg | ORAL_TABLET | Freq: Every day | ORAL | Status: DC
  Administered 2023-01-14 – 2023-01-24 (×11): 500 mg via ORAL
  Filled 2023-01-14 (×11): qty 1

## 2023-01-14 MED ORDER — OLANZAPINE 5 MG PO TABS
10.0000 mg | ORAL_TABLET | Freq: Every day | ORAL | Status: DC
  Administered 2023-01-14 – 2023-01-23 (×10): 10 mg via ORAL
  Filled 2023-01-14 (×10): qty 2

## 2023-01-14 MED ORDER — LEVETIRACETAM 750 MG PO TABS
1500.0000 mg | ORAL_TABLET | Freq: Two times a day (BID) | ORAL | Status: DC
  Administered 2023-01-14 – 2023-01-24 (×21): 1500 mg via ORAL
  Filled 2023-01-14 (×3): qty 2
  Filled 2023-01-14: qty 3
  Filled 2023-01-14 (×17): qty 2

## 2023-01-14 MED ORDER — CLOPIDOGREL BISULFATE 75 MG PO TABS
75.0000 mg | ORAL_TABLET | Freq: Every day | ORAL | Status: DC
  Administered 2023-01-14 – 2023-01-24 (×11): 75 mg via ORAL
  Filled 2023-01-14 (×11): qty 1

## 2023-01-14 MED ORDER — OLANZAPINE 10 MG IM SOLR
5.0000 mg | Freq: Three times a day (TID) | INTRAMUSCULAR | Status: DC | PRN
  Filled 2023-01-14: qty 10

## 2023-01-14 MED ORDER — ADULT MULTIVITAMIN W/MINERALS CH
1.0000 | ORAL_TABLET | Freq: Every day | ORAL | Status: DC
  Administered 2023-01-14 – 2023-01-24 (×11): 1 via ORAL
  Filled 2023-01-14 (×11): qty 1

## 2023-01-14 MED ORDER — OLANZAPINE 5 MG PO TBDP: 5.0000 mg | ORAL_TABLET | Freq: Three times a day (TID) | ORAL | Status: DC | PRN

## 2023-01-14 MED ORDER — ENOXAPARIN SODIUM 40 MG/0.4ML IJ SOSY
40.0000 mg | PREFILLED_SYRINGE | INTRAMUSCULAR | Status: DC
  Administered 2023-01-14 – 2023-01-17 (×4): 40 mg via SUBCUTANEOUS
  Filled 2023-01-14 (×4): qty 0.4

## 2023-01-14 MED ORDER — FINASTERIDE 5 MG PO TABS
5.0000 mg | ORAL_TABLET | Freq: Every day | ORAL | Status: DC
  Administered 2023-01-14 – 2023-01-24 (×11): 5 mg via ORAL
  Filled 2023-01-14 (×11): qty 1

## 2023-01-14 MED ORDER — IOHEXOL 350 MG/ML SOLN
75.0000 mL | Freq: Once | INTRAVENOUS | Status: AC | PRN
  Administered 2023-01-14: 75 mL via INTRAVENOUS

## 2023-01-14 MED ORDER — SENNA 8.6 MG PO TABS
1.0000 | ORAL_TABLET | Freq: Every day | ORAL | Status: DC
  Administered 2023-01-14: 8.6 mg via ORAL
  Filled 2023-01-14: qty 1

## 2023-01-14 MED ORDER — POLYETHYLENE GLYCOL 3350 17 G PO PACK
17.0000 g | PACK | Freq: Every day | ORAL | Status: DC
  Filled 2023-01-14: qty 1

## 2023-01-14 MED ORDER — ENSURE ENLIVE PO LIQD
237.0000 mL | Freq: Three times a day (TID) | ORAL | Status: DC
  Administered 2023-01-14 – 2023-01-24 (×21): 237 mL via ORAL
  Filled 2023-01-14: qty 237

## 2023-01-14 MED ORDER — ROSUVASTATIN CALCIUM 5 MG PO TABS
5.0000 mg | ORAL_TABLET | Freq: Every day | ORAL | Status: DC
  Administered 2023-01-14 – 2023-01-23 (×10): 5 mg via ORAL
  Filled 2023-01-14 (×10): qty 1

## 2023-01-14 MED ORDER — UMECLIDINIUM BROMIDE 62.5 MCG/ACT IN AEPB
1.0000 | INHALATION_SPRAY | Freq: Every day | RESPIRATORY_TRACT | Status: DC
  Administered 2023-01-15 – 2023-01-24 (×10): 1 via RESPIRATORY_TRACT
  Filled 2023-01-14 (×2): qty 7

## 2023-01-14 NOTE — ED Notes (Signed)
Pt was able to stand, wash his hands in sink and pivot onto bed without assistance. Pt back in bed.

## 2023-01-14 NOTE — Assessment & Plan Note (Addendum)
Patient with known history of dementia, and continues to have behavioral disturbances centered around smoking. Patient is A&O x 4 today, in pleasant mood. Will continue to monitor -Zyprexa 10 mg daily -Zyprexa 5 mg prn, for agitation

## 2023-01-14 NOTE — Assessment & Plan Note (Addendum)
Patient has Stage III adenocarcinoma, following with [facility]. Patient is on abraxane/gemcitabine for curative treatment. Patient was diagnosed in 08/12/22, had Whipple procedure, and is now on chemotherapy. Patient had CT abdomen that showed worsening of disease on admission today, with infiltrative metastatic disease in the surgical site soft tissues, and possibly liver. According to care giver, patient has not had last 2-3 treatments, that he get's ~ 2 weeks.  -Consider Hem/Onc consult -Consider restarting abraxane/gemcitabine while inpatient

## 2023-01-14 NOTE — Assessment & Plan Note (Addendum)
Patient presents with 3 to 4 days of abdominal pain, and no bowel movement.  Patient had CT abdomen that displayed significant stool burden in colon.  This is likely the cause of abdominal pain.  Patient vitals otherwise stable, no concern for SBO, or perforation given imaging, and normal labs.  Will recommend enema. - Tap water Enema
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
## 2023-01-14 NOTE — Assessment & Plan Note (Addendum)
Patient has no family support, and receives all his care from his medical care giver/POA [name]. She is having a difficult time with his behaviors.
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
## 2023-01-14 NOTE — Assessment & Plan Note (Signed)
Patient presents with 3 to 4 days of abdominal pain, and no bowel movement.  Patient had CT abdomen that displayed significant stool burden in colon.  This is likely the cause of abdominal pain.  Patient vitals otherwise stable, no concern for SBO, or perforation given imaging, and normal labs.  Will recommend enema. - Tap water Enema -Miralax and Senna daily

## 2023-01-14 NOTE — ED Notes (Signed)
On bedside toliet. Assisted by [place]. W Paramedic

## 2023-01-14 NOTE — Progress Notes (Signed)
FMTS will be admitting this patient. He was recently on our service for an acute CVA and just discharged on Saturday. He returns today from his facility with complaints of no stool for 4 days, progressive abdominal pain, and ongoing agitation. Has required 24 hour sitter at his facility. Per ED report after conversations with his POA and facility, he's been even more confused/agitated than his baseline.  ED workup for his abdominal pain included CT which showed diffuse colonic stool concerning for recurrence of his pancreatic CA.  [facility].   I have seen him in the ED to ensure stability. He was sleeping comfortably and I elected not to wake him due to his labile behaviors. Vitals stable on the monitor.   We will plan to admit for management of his constipation and disruptive behaviors and further workup of this possible pancreatic cancer recurrence. Full H&P to follow per Dr. Barbaraann Faster.   Eliezer Mccoy, MD

## 2023-01-14 NOTE — ED Notes (Signed)
Pt refused to sit or lay in bed for post void residual

## 2023-01-14 NOTE — Progress Notes (Addendum)
FMTS Brief Progress Note  S: Seen at bedside at request of nursing staff. He is demanding to leave the hospital. When asked about his plans for departure, he tells me that he plans to "go to a hotel." He is alert and oriented to person, place, and situation, but when asked what might happen to him if he leaves the hospital, he tells me only that he "will get a comfortable mattress." Cannot tell me where he is going or how he plans to get there. When discussing his pancreatic cancer and the likely recurrence noted on his CT Abdomen/Pelvis, he tells me that he "thinks it will pass."  Discussed with his HCPOA [name]. She affirms that he does not have the means to get himself to a hotel. He has advanced dementia and Bipolar 1  at baseline and has had a clear pattern of increasingly disorganized thought and agitated behavior over the past several weeks including hypersexual behaviors suggestive of ?mania vs progression of his dementia. He does not presently have capacity to sign himself out AMA as he cannot articulate the consequences of doing so.     In an evaluation of capacity, each of the following criteria must be met based on medical necessity in order for a patient to have capacity to make the decision in question. Of note, the capacity evaluation assesses only for the specified decision documented above and is not a determination of the patient's overall competency, which can only be adjudicated.  Criterion 1: The patient demonstrates a clear and consistent voluntary choice with regard to treatment options.  Criterion 2: The patient adequately understands the disease they have, the treatment proposed, the risks of treatment, and the risks of other treatment (including no treatment).  Criterion 3: The patient acknowledges that the details of Criterion 2 apply to them specifically and the likely consequences of treatment options proposed.  Criterion 4: The patient demonstrates adequate  reasoning/rationality within the context of their decision and can provide justification for their choice.  In this case, the patient does not have capacity to leave AMA at this time as he fails to fulfill criteria 2-4.    On further questioning it seems his primary reason for not wanting to stay is that he cannot smoke cigarettes and that nicotine patches "do not work." He says that nicotine lozenges do work but that we do not have these in stock. Talked to pharmacy and confirmed that we do not stock these in the hospital. I asked his POA [name] if she would be able to bring his lozenges in for [place] but she believes he took them all and has none left.   In talking with the patient, he would be willing to stay if he could be allowed to smoke a cigarette. Unfortunately does not have any with him.  Given patient's attempts to leave AMA, lack of decision-making capacity, and the risks to himself should he leave the hospital tonight with his impaired judgment and no safe disposition we will file for an IVC.     O: BP 101/62   Pulse 81   Temp (!) 97.5 F (36.4 C) (Oral)   Resp 20   Ht 5\' 6"  (1.676 m)   Wt 63.5 kg   SpO2 98%   BMI 22.60 kg/m    A/P: - Patient has Zyprexa ordered PRN for agitation. Anticipate that he will become agitated upon learning that he has been IVCd. Nursing staff is aware and will involve security as needed.  -  Should the patient remain agitated after administration of the Zyprexa, we will order Ativan 1mg  PRN as this seemed to have good effect last hospitalization.  - We will do our best to accommodate some of this patient's wishes to maximize comfort and minimize agitation. I am hopeful his POA will be able to bring in some nicotine lozenges tomorrow that we can have pharmacy verify and administer as an off-formulary medication.     ADDENDUM 2206 IVC was not actually submitted as above due to a documentation error. Met with Dr. Sharion Dove, [facility], nursing staff, and patient  on 6N. After much back and forth with the patient, he is ultimately willing to stay for the night on a voluntary basis if we are willing to give him some Ambien to help with sleep and a "memory pill."   Of note, during our conversation he continues to demonstrate evidence of altered mentation. For example, he declared that his nurse threw his PM meds in the trash. This, of course, did not happen.   - Patient is NOT IVC at this time - Ambien 2.5mg  x1 tonight for sleep - Patient to receive his evening medications: Depakote, Keppra, Zyprexa, Crestor - Has daily Aricept on as his "memory pill" - Order for PRN Zyprexa for agitation remains active  - Should he attempt to leave AMA again this evening, we would file IVC paperwork as he would be at great danger were he to leave the hospital in his current mental state with no plan for safe disposition.  - Will ask HCPOA [name] to bring in some nicotine lozenges tomorrow      Alicia Amel, MD 01/14/2023, 7:38 PM PGY-3, [place] [facility] Night Resident  Please page [phone] with questions.

## 2023-01-14 NOTE — ED Notes (Signed)
ED TO INPATIENT HANDOFF REPORT  ED Nurse Name and Phone #:   S Name/Age/Gender Kerry [name] 77 y.o. male Room/Bed: 005C/005C  Code Status   Code Status: Full Code  Home/SNF/Other Home Patient oriented to: self, place, and time Is this baseline? 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 (per pt)     Level of Care/Admitting Diagnosis ED Disposition     ED Disposition  Admit   Condition  --   Comment  Hospital Area: [facility] [[id number]]  Level of Care: Med-Surg [16]  May admit patient to [name] or [facility] if equivalent level of care is available:: No  Covid Evaluation: Asymptomatic - no recent exposure (last 10 days) testing not required  Diagnosis: Constipation [[id number]]  Admitting Physician: Caro Laroche [[id number]]  Attending Physician: Caro Laroche [[id number]]  Certification:: I certify this patient will need inpatient services for at least 2 midnights  Expected Medical Readiness: 01/17/2023          B Medical/Surgery History Past Medical History:  Diagnosis Date   anxiety    Anxiety    Bipolar 1 disorder (HCC)    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   Dementia (HCC)    Seizures (HCC)    Sleep apnea    Past Surgical History:  Procedure Laterality Date   BILIARY BRUSHING  10/23/2021   Procedure: BILIARY BRUSHING;  Surgeon: Lemar Lofty., MD;  Location: [facility] ENDOSCOPY;  Service: Gastroenterology;;   BILIARY STENT PLACEMENT  10/23/2021   Procedure: BILIARY STENT PLACEMENT;  Surgeon: Lemar Lofty., MD;  Location: [facility] ENDOSCOPY;  Service: Gastroenterology;;   BIOPSY  10/23/2021   Procedure: BIOPSY;  Surgeon: Lemar Lofty., MD;  Location: [facility] ENDOSCOPY;  Service:  Gastroenterology;;   ERCP N/A 10/23/2021   Procedure: ENDOSCOPIC RETROGRADE CHOLANGIOPANCREATOGRAPHY (ERCP);  Surgeon: Lemar Lofty., MD;  Location: [facility] ENDOSCOPY;  Service: Gastroenterology;  Laterality: N/A;   ESOPHAGOGASTRODUODENOSCOPY (EGD) WITH PROPOFOL N/A 10/23/2021   Procedure: ESOPHAGOGASTRODUODENOSCOPY (EGD) WITH PROPOFOL;  Surgeon: Meridee Score Netty Starring., MD;  Location: [facility] ENDOSCOPY;  Service: Gastroenterology;  Laterality: N/A;   FINE NEEDLE ASPIRATION  10/23/2021   Procedure: FINE NEEDLE ASPIRATION (FNA) LINEAR;  Surgeon: Lemar Lofty., MD;  Location: [facility] ENDOSCOPY;  Service: Gastroenterology;;   REMOVAL OF STONES  10/23/2021   Procedure: REMOVAL OF SLUDGE;  Surgeon: Lemar Lofty., MD;  Location: [facility] ENDOSCOPY;  Service: Gastroenterology;;   [name]  10/23/2021   Procedure: [name];  Surgeon: Lemar Lofty., MD;  Location: [facility] ENDOSCOPY;  Service: Gastroenterology;;   UPPER ESOPHAGEAL ENDOSCOPIC ULTRASOUND (EUS) N/A 10/23/2021   Procedure: UPPER ESOPHAGEAL ENDOSCOPIC ULTRASOUND (EUS);  Surgeon: Lemar Lofty., MD;  Location: [facility] ENDOSCOPY;  Service: Gastroenterology;  Laterality: N/A;     A IV Location/Drains/Wounds Patient Lines/Drains/Airways Status     Active Line/Drains/Airways     Name Placement date Placement time Site Days   Peripheral IV 01/14/23 20 G Anterior;Proximal;Right Forearm 01/14/23  0312  Forearm  less than 1   GI Stent 10/23/21  1353  --  448            Intake/Output Last 24 hours No intake or output data in the 24 hours ending 01/14/23 1236  Labs/Imaging Results for  orders placed or performed during the hospital encounter of 01/14/23 (from the past 48 hour(s))  CBC with Differential     Status: Abnormal   Collection Time: 01/14/23  3:12 AM  Result Value Ref Range   WBC 6.4 4.0 - 10.5 K/uL   RBC 3.62 (L) 4.22 - 5.81 MIL/uL   Hemoglobin 10.8 (L) 13.0 - 17.0 g/dL   HCT 96.0 (L) 45.4 - 09.8 %   MCV  91.7 80.0 - 100.0 fL   MCH 29.8 26.0 - 34.0 pg   MCHC 32.5 30.0 - 36.0 g/dL   RDW 11.9 14.7 - 82.9 %   Platelets 108 (L) 150 - 400 K/uL   nRBC 0.0 0.0 - 0.2 %   Neutrophils Relative % 63 %   Neutro Abs 4.1 1.7 - 7.7 K/uL   Lymphocytes Relative 23 %   Lymphs Abs 1.5 0.7 - 4.0 K/uL   Monocytes Relative 11 %   Monocytes Absolute 0.7 0.1 - 1.0 K/uL   Eosinophils Relative 2 %   Eosinophils Absolute 0.1 0.0 - 0.5 K/uL   Basophils Relative 1 %   Basophils Absolute 0.0 0.0 - 0.1 K/uL   Immature Granulocytes 0 %   Abs Immature Granulocytes 0.02 0.00 - 0.07 K/uL    Comment: Performed at [facility], [address], [place], [place] [address]  Comprehensive metabolic panel     Status: Abnormal   Collection Time: 01/14/23  3:12 AM  Result Value Ref Range   Sodium 138 135 - 145 mmol/L   Potassium 3.8 3.5 - 5.1 mmol/L   Chloride 103 98 - 111 mmol/L   CO2 20 (L) 22 - 32 mmol/L   Glucose, Bld 105 (H) 70 - 99 mg/dL    Comment: Glucose reference range applies only to samples taken after fasting for at least 8 hours.   BUN 24 (H) 8 - 23 mg/dL   Creatinine, Ser 0.86 0.61 - 1.24 mg/dL   Calcium 9.3 8.9 - 57.8 mg/dL   Total Protein 7.0 6.5 - 8.1 g/dL   Albumin 3.4 (L) 3.5 - 5.0 g/dL   AST 26 15 - 41 U/L   ALT 21 0 - 44 U/L   Alkaline Phosphatase 66 38 - 126 U/L   Total Bilirubin 0.7 0.3 - 1.2 mg/dL   GFR, Estimated >46 >96 mL/min    Comment: (NOTE) Calculated using the CKD-EPI Creatinine Equation (2021)    Anion gap 15 5 - 15    Comment: Performed at [facility], [address] [place], [place] [address]  Lipase, blood     Status: None   Collection Time: 01/14/23  3:12 AM  Result Value Ref Range   Lipase 17 11 - 51 U/L    Comment: Performed at [facility], [address], [place], [place] [address]  Urinalysis, w/ Reflex to Culture (Infection Suspected) -Urine, Clean Catch     Status: Abnormal   Collection Time: 01/14/23  3:12 AM  Result Value Ref Range   Specimen  Source URINE, CLEAN CATCH    Color, Urine YELLOW YELLOW   APPearance CLEAR CLEAR   Specific Gravity, Urine 1.021 1.005 - 1.030   pH 5.0 5.0 - 8.0   Glucose, UA NEGATIVE NEGATIVE mg/dL   Hgb urine dipstick NEGATIVE NEGATIVE   Bilirubin Urine NEGATIVE NEGATIVE   Ketones, ur 20 (A) NEGATIVE mg/dL   Protein, ur NEGATIVE NEGATIVE mg/dL   Nitrite NEGATIVE NEGATIVE   Leukocytes,Ua NEGATIVE NEGATIVE  RBC / HPF 0-5 0 - 5 RBC/hpf   WBC, UA 0-5 0 - 5 WBC/hpf    Comment:        Reflex urine culture not performed if WBC <=10, OR if Squamous epithelial cells >5. If Squamous epithelial cells >5 suggest recollection.    Bacteria, UA NONE SEEN NONE SEEN   Squamous Epithelial / HPF 0-5 0 - 5 /HPF   Mucus PRESENT     Comment: Performed at [facility], [address] [place], [place] [address]  Ammonia     Status: Abnormal   Collection Time: 01/14/23  3:53 AM  Result Value Ref Range   Ammonia 74 (H) 9 - 35 umol/L    Comment: Performed at [facility], [address], [place], [place] [address]   CT Head Wo Contrast  Result Date: 01/14/2023 CLINICAL DATA:  Delirium EXAM: CT HEAD WITHOUT CONTRAST TECHNIQUE: Contiguous axial images were obtained from the base of the skull through the vertex without intravenous contrast. RADIATION DOSE REDUCTION: This exam was performed according to the departmental dose-optimization program which includes automated exposure control, adjustment of the mA and/or kV according to patient size and/or use of iterative reconstruction technique. COMPARISON:  Head CT from 7 days ago FINDINGS: Brain: Unchanged cortical edema involving the right insula and parasagittal right frontal lobe. No evidence of progression, hemorrhage, hydrocephalus, or mass. Chronic small vessel ischemia in the cerebral white matter. Vascular: No hyperdense vessel or unexpected calcification. Skull: Normal. Negative for fracture or focal lesion. Sinuses/Orbits: No acute finding. Other: Recent  enhanced abdominal CT with diffusely accentuated dura and vessels. IMPRESSION: Unchanged right insular and frontal cortex edema. No new or progressive finding Electronically Signed   By: Tiburcio Pea M.D.   On: 01/14/2023 06:32   CT ABDOMEN PELVIS W CONTRAST  Result Date: 01/14/2023 CLINICAL DATA:  Bowel obstruction suspected. No bowel movement for 1 week EXAM: CT ABDOMEN AND PELVIS WITH CONTRAST TECHNIQUE: Multidetector CT imaging of the abdomen and pelvis was performed using the standard protocol following bolus administration of intravenous contrast. RADIATION DOSE REDUCTION: This exam was performed according to the departmental dose-optimization program which includes automated exposure control, adjustment of the mA and/or kV according to patient size and/or use of iterative reconstruction technique. CONTRAST:  75mL OMNIPAQUE IOHEXOL 350 MG/ML SOLN COMPARISON:  12/26/2022 FINDINGS: Lower chest: Subpleural opacification at the right lung base with volume loss attributed to scarring. Hepatobiliary: Pneumobilia in the left lobe, postoperative. There are subtle low densities scattered in the liver better seen than on recent prior, up to 11 mm beneath the right diaphragm. These are marked on series 3. Only 1 of these was present on a staging abdominal MRI from 10/20/2021.Absent gallbladder Pancreas: Changes of Whipple procedure. Ill-defined soft tissue density in the retroperitoneum with occlusion or pre occlusion at the portal venous confluence and track like structure extending ventrally towards the distal stomach and around the stump of hepaticojejunostomy. Spleen: Unremarkable. Adrenals/Urinary Tract: Negative adrenals. No hydronephrosis or ureteral calculus. Renal cysts measuring up to 4.3 cm in the left kidney. No follow-up imaging is indicated. Unremarkable bladder. Stomach/Bowel: Diffuse formed stool distending the distal colon. No rectal impaction however. No small bowel obstruction or gastric  distension. Vascular/Lymphatic: No acute vascular abnormality. Retroperitoneal soft tissue density is noted above. No adenopathy. Reproductive:No pathologic findings. Other: No ascites or pneumoperitoneum. Musculoskeletal: No acute abnormalities. Thoracic spine degeneration with multilevel bridging osteophyte. Generalized lumbar spine degeneration. IMPRESSION: 1. Diffuse colonic stool suggesting constipation. No  small bowel obstruction. 2. Pancreas cancer with resection but extensive infiltrative soft tissue in the operative region concerning for recurrence. There also low-density lesions scattered in the liver compatible with metastatic disease. Electronically Signed   By: Tiburcio Pea M.D.   On: 01/14/2023 05:37    Pending Labs Unresulted Labs (From admission, onward)     Start     Ordered   01/15/23 0500  Basic metabolic panel  Tomorrow morning,   R        01/14/23 0929            Vitals/Pain Today's Vitals   01/14/23 0521 01/14/23 0758 01/14/23 0900 01/14/23 0907  BP: (!) 104/51   121/66  Pulse: 78   82  Resp: (!) 22   18  Temp: 98 F (36.7 C)   (!) 97.5 F (36.4 C)  TempSrc: Oral   Oral  SpO2: 100%   100%  Weight:      Height:      PainSc:  Asleep 0-No pain     Isolation Precautions No active isolations  Medications Medications  aspirin EC tablet 81 mg (has no administration in time range)  rosuvastatin (CRESTOR) tablet 5 mg (has no administration in time range)  donepezil (ARICEPT) tablet 5 mg (has no administration in time range)  nicotine (NICODERM CQ - dosed in mg/24 hours) patch 21 mg (has no administration in time range)  nicotine polacrilex (NICORETTE) gum 2 mg (has no administration in time range)  OLANZapine (ZYPREXA) tablet 10 mg (has no administration in time range)  OLANZapine zydis (ZYPREXA) disintegrating tablet 5 mg (has no administration in time range)  finasteride (PROSCAR) tablet 5 mg (has no administration in time range)  clopidogrel (PLAVIX)  tablet 75 mg (has no administration in time range)  divalproex (DEPAKOTE ER) 24 hr tablet 1,000 mg (has no administration in time range)  levETIRAcetam (KEPPRA) tablet 1,500 mg (has no administration in time range)  cholecalciferol (VITAMIN D3) 25 MCG (1000 UNIT) tablet 2,000 Units (has no administration in time range)  feeding supplement (ENSURE ENLIVE / ENSURE PLUS) liquid 237 mL (has no administration in time range)  multivitamin with minerals tablet 1 tablet (has no administration in time range)  ascorbic acid (VITAMIN C) tablet 500 mg (has no administration in time range)  umeclidinium bromide (INCRUSE ELLIPTA) 62.5 MCG/ACT 1 puff (has no administration in time range)  enoxaparin (LOVENOX) injection 40 mg (has no administration in time range)  iohexol (OMNIPAQUE) 350 MG/ML injection 75 mL (75 mLs Intravenous Contrast Given 01/14/23 0454)    Mobility non-ambulatory     Focused Assessments    R Recommendations: See Admitting Provider Note  Report given to:   Additional Notes:

## 2023-01-14 NOTE — ED Provider Notes (Signed)
[name] Provider Note   CSN: [id number] Arrival date & time: 01/14/23  0120     History Chief Complaint  Patient presents with   Abdominal Pain    Pt co abdominal pain for 3-4 days, states that he thinks he has stomach cancer because his brother had cancer. Pt placed in gown. Co nausea, no vomiting, has diarrhea and constipation for the 3-4 days also.    [name] is a 77 y.o. 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  He keeps repeating that he is concerned he may have stomach cancer because his brother had it.  He denies nausea vomiting but has had some  constipation without bowel movement in 4 days.  No fevers or chills.  Patient with strange affect.  Per chart review patient has history of dementia, presenting to our facility.  A level 5 caveat due to patient's underlying dementia.  In addition to the above listed history is history of seizures.  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  On aspirin and Plavix at this time.  Smokes daily.  HPI     Home Medications Prior to Admission medications   Medication Sig Start Date End Date Taking? Authorizing Provider  aspirin EC 81 MG tablet Take 1 tablet (81 mg total) by mouth daily. 10/28/21   Rodolph Bong, MD  Cholecalciferol (VITAMIN D) 50 MCG (2000 UT) tablet Take 2,000 Units by mouth at bedtime.    [provider]  clopidogrel (PLAVIX) 75 MG tablet Take 1 tablet (75 mg total) by mouth daily for 17 days. 01/12/23 01/29/23  Shelby Mattocks, DO  divalproex (DEPAKOTE ER) 500 MG 24 hr tablet Take 2 tablets (1,000 mg total) by mouth at bedtime. 12/09/22 01/08/23  Dorcas Carrow, MD  donepezil (ARICEPT) 5 MG tablet Take 1 tablet (5 mg total) by mouth every morning. 01/12/23   Shelby Mattocks, DO  feeding supplement  (ENSURE ENLIVE / ENSURE PLUS) LIQD Take 237 mLs by mouth 3 (three) times daily between meals. 01/11/23   Shelby Mattocks, DO  finasteride (PROSCAR) 5 MG tablet Take 5 mg by mouth daily. For overactive bladder    [provider]  levETIRAcetam (KEPPRA) 750 MG tablet Take 2 tablets (1,500 mg total) by mouth 2 (two) times daily. 12/09/22   Dorcas Carrow, MD  Multiple Vitamin (MULTIVITAMIN WITH MINERALS) TABS tablet Take 1 tablet by mouth daily. 01/12/23   Shelby Mattocks, DO  nicotine (NICODERM CQ - DOSED IN MG/24 HOURS) 21 mg/24hr patch Place 1 patch (21 mg total) onto the skin daily. 01/12/23   Alicia Amel, MD  nicotine polacrilex (NICORETTE) 2 MG gum Take 1 each (2 mg total) by mouth as needed for smoking cessation. 01/12/23   Peterson Ao, MD  OLANZapine (ZYPREXA) 10 MG tablet Take 1 tablet (10 mg total) by mouth at bedtime. 01/12/23   Alicia Amel, MD  OLANZapine zydis (ZYPREXA) 5 MG disintegrating tablet Take 1 tablet (5 mg total) by mouth every 8 (eight) hours as needed (agitation). 01/11/23   Shelby Mattocks, DO  rosuvastatin (CRESTOR) 10 MG tablet Take 0.5 tablets (5 mg total) by mouth at bedtime. For cholesterol 11/01/21   Rodolph Bong, MD  umeclidinium bromide (INCRUSE ELLIPTA) 62.5 MCG/ACT AEPB Inhale 1 puff into the lungs daily. 01/12/23  Alicia Amel, MD  vitamin C (ASCORBIC ACID) 500 MG tablet Take 500 mg by mouth daily.    [provider]      Allergies    Fluoride    Review of Systems   Review of Systems  Unable to perform ROS: Dementia  Gastrointestinal:  Positive for abdominal pain and constipation.    Physical Exam Updated Vital Signs BP (!) 104/51   Pulse 78   Temp 98 F (36.7 C) (Oral)   Resp (!) 22   Ht 5\' 6"  (1.676 m)   Wt 63.5 kg   SpO2 100%   BMI 22.60 kg/m  Physical Exam Vitals and nursing note reviewed.  Constitutional:      Appearance: He is cachectic. He is not ill-appearing or toxic-appearing.     Comments: Chronically  ill-appearing  HENT:     Head: Normocephalic and atraumatic.     Mouth/Throat:     Mouth: Mucous membranes are moist.     Pharynx: No oropharyngeal exudate or posterior oropharyngeal erythema.  Eyes:     General:        Right eye: No discharge.        Left eye: No discharge.     Conjunctiva/sclera: Conjunctivae normal.  Cardiovascular:     Rate and Rhythm: Normal rate and regular rhythm.     Pulses: Normal pulses.     Heart sounds: Normal heart sounds. No murmur heard. Pulmonary:     Effort: Pulmonary effort is normal. No tachypnea, bradypnea, accessory muscle usage, prolonged expiration or respiratory distress.     Breath sounds: Normal breath sounds. No wheezing or rales.  Chest:     Chest wall: No mass, lacerations, deformity, swelling, tenderness, crepitus or edema.  Abdominal:     General: Bowel sounds are normal. There is no distension.     Palpations: Abdomen is soft.     Tenderness: There is generalized abdominal tenderness. There is no right CVA tenderness, left CVA tenderness, guarding or rebound. Negative signs include Rovsing's sign.  Musculoskeletal:        General: No deformity.     Cervical back: Neck supple.     Right lower leg: No edema.     Left lower leg: No edema.  Skin:    General: Skin is warm and dry.     Capillary Refill: Capillary refill takes less than 2 seconds.  Neurological:     General: No focal deficit present.     Mental Status: He is alert and oriented to person, place, and time. Mental status is at baseline.     GCS: GCS eye subscore is 4. GCS verbal subscore is 4. GCS motor subscore is 6.     Sensory: Sensation is intact.     Motor: Motor function is intact.  Psychiatric:        Mood and Affect: Mood normal.     ED Results / Procedures / Treatments   Labs (all labs ordered are listed, but only abnormal results are displayed) Labs Reviewed  CBC WITH DIFFERENTIAL/PLATELET - Abnormal; Notable for the following components:      Result  Value   RBC 3.62 (*)    Hemoglobin 10.8 (*)    HCT 33.2 (*)    Platelets 108 (*)    All other components within normal limits  COMPREHENSIVE METABOLIC PANEL - Abnormal; Notable for the following components:   CO2 20 (*)    Glucose, Bld 105 (*)    BUN 24 (*)  Albumin 3.4 (*)    All other components within normal limits  URINALYSIS, W/ REFLEX TO CULTURE (INFECTION SUSPECTED) - Abnormal; Notable for the following components:   Ketones, ur 20 (*)    All other components within normal limits  AMMONIA - Abnormal; Notable for the following components:   Ammonia 74 (*)    All other components within normal limits  LIPASE, BLOOD    EKG None  Radiology CT Head Wo Contrast  Result Date: 01/14/2023 CLINICAL DATA:  Delirium EXAM: CT HEAD WITHOUT CONTRAST TECHNIQUE: Contiguous axial images were obtained from the base of the skull through the vertex without intravenous contrast. RADIATION DOSE REDUCTION: This exam was performed according to the departmental dose-optimization program which includes automated exposure control, adjustment of the mA and/or kV according to patient size and/or use of iterative reconstruction technique. COMPARISON:  Head CT from 7 days ago FINDINGS: Brain: Unchanged cortical edema involving the right insula and parasagittal right frontal lobe. No evidence of progression, hemorrhage, hydrocephalus, or mass. Chronic small vessel ischemia in the cerebral white matter. Vascular: No hyperdense vessel or unexpected calcification. Skull: Normal. Negative for fracture or focal lesion. Sinuses/Orbits: No acute finding. Other: Recent enhanced abdominal CT with diffusely accentuated dura and vessels. IMPRESSION: Unchanged right insular and frontal cortex edema. No new or progressive finding Electronically Signed   By: Tiburcio Pea M.D.   On: 01/14/2023 06:32   CT ABDOMEN PELVIS W CONTRAST  Result Date: 01/14/2023 CLINICAL DATA:  Bowel obstruction suspected. No bowel movement for  1 week EXAM: CT ABDOMEN AND PELVIS WITH CONTRAST TECHNIQUE: Multidetector CT imaging of the abdomen and pelvis was performed using the standard protocol following bolus administration of intravenous contrast. RADIATION DOSE REDUCTION: This exam was performed according to the departmental dose-optimization program which includes automated exposure control, adjustment of the mA and/or kV according to patient size and/or use of iterative reconstruction technique. CONTRAST:  75mL OMNIPAQUE IOHEXOL 350 MG/ML SOLN COMPARISON:  12/26/2022 FINDINGS: Lower chest: Subpleural opacification at the right lung base with volume loss attributed to scarring. Hepatobiliary: Pneumobilia in the left lobe, postoperative. There are subtle low densities scattered in the liver better seen than on recent prior, up to 11 mm beneath the right diaphragm. These are marked on series 3. Only 1 of these was present on a staging abdominal MRI from 10/20/2021.Absent gallbladder Pancreas: Changes of Whipple procedure. Ill-defined soft tissue density in the retroperitoneum with occlusion or pre occlusion at the portal venous confluence and track like structure extending ventrally towards the distal stomach and around the stump of hepaticojejunostomy. Spleen: Unremarkable. Adrenals/Urinary Tract: Negative adrenals. No hydronephrosis or ureteral calculus. Renal cysts measuring up to 4.3 cm in the left kidney. No follow-up imaging is indicated. Unremarkable bladder. Stomach/Bowel: Diffuse formed stool distending the distal colon. No rectal impaction however. No small bowel obstruction or gastric distension. Vascular/Lymphatic: No acute vascular abnormality. Retroperitoneal soft tissue density is noted above. No adenopathy. Reproductive:No pathologic findings. Other: No ascites or pneumoperitoneum. Musculoskeletal: No acute abnormalities. Thoracic spine degeneration with multilevel bridging osteophyte. Generalized lumbar spine degeneration. IMPRESSION: 1.  Diffuse colonic stool suggesting constipation. No small bowel obstruction. 2. Pancreas cancer with resection but extensive infiltrative soft tissue in the operative region concerning for recurrence. There also low-density lesions scattered in the liver compatible with metastatic disease. Electronically Signed   By: Tiburcio Pea M.D.   On: 01/14/2023 05:37    Procedures Procedures    Medications Ordered in ED Medications  iohexol (OMNIPAQUE) 350 MG/ML injection  75 mL (75 mLs Intravenous Contrast Given 01/14/23 0454)    ED Course/ Medical Decision Making/ A&P Clinical Course as of 01/14/23 2440  [facility] Jan 14, 2023  1027 Consult family medicine who is agreeable to admit this patient to their service. I appreciate their collaboration in the care of this patient.  [RS]    Clinical Course User Index [RS] Embry Manrique, Eugene Gavia, PA-C                                 Medical Decision Making 77 y/o male who presents with concern for constipation and abdominal pain.   VS normal on intake, cardiopulmonary exam is unremarkable, abdominal exam is with generalized TTP without focality. Neuro exam without focal deficit though patient does have strange, flat affect.   The differential diagnosis for AMS is extensive and includes, but is not limited to:  Drug overdose - opioids, alcohol, sedatives, antipsychotics, drug withdrawal, others Metabolic: hypoxia, hypoglycemia, hyperglycemia, hypercalcemia, hypernatremia, hyponatremia, uremia, hepatic encephalopathy, hypothyroidism, hyperthyroidism, vitamin B12 or thiamine deficiency, carbon monoxide poisoning, Wilson's disease, Lactic acidosis, DKA/HHOS Infectious: meningitis, encephalitis, bacteremia/sepsis, urinary tract infection, pneumonia, neurosyphilis Structural: Space-occupying lesion, (brain tumor, subdural hematoma, hydrocephalus,) Vascular: stroke, subarachnoid hemorrhage, coronary ischemia, hypertensive encephalopathy, CNS vasculitis, thrombotic  thrombocytopenic purpura, disseminated intravascular coagulation, hyperviscosity Psychiatric: Schizophrenia, depression; Other: Seizure, hypothermia, heat stroke, ICU psychosis, dementia -"sundowning."  Amount and/or Complexity of Data Reviewed External Data Reviewed: radiology.    Details: MR brain with acute infarct on 8/19.  No evidence of lesions suggestive evidence of brain metastases at this time. Labs: ordered.    Details: CBC with without leukocytosis with anemia hemoglobin 10.8.  UA without evidence of infection but with ketonuria.  CMP unremarkable.  Lipase is normal, ammonia elevated to 74.  Radiology: ordered.    Details:  CT abdomen pelvis with findings concerning for recurrence of patient's pancreatic cancer  Risk Prescription drug management. Decision regarding hospitalization.   [facility] to [name].  I was able to contact the patient's POA [name] who states that the [place] will likely have new living placement for this patient early this week.  She is reaching out to their contact after their office opens at 8 AM today and will reach back out to our facility to update [place] on his disposition plan from their perspective.  Consult to family medicine who recently cared for the patient in the inpatient setting following his CVA.  They are amenable to admission at this time.  He is resting comfortably in his room.  This chart was dictated using voice recognition software, Dragon. Despite the best efforts of this provider to proofread and correct errors, errors may still occur which can change documentation meaning.    Final Clinical Impression(s) / ED Diagnoses Final diagnoses:  Delirium    Rx / DC Orders ED Discharge Orders     None         Sherrilee Gilles 01/14/23 2536    Sabas Sous, MD 01/15/23 [phone]

## 2023-01-14 NOTE — ED Notes (Signed)
2W cannot take due to no sitter available-AC aware and looking into situation

## 2023-01-14 NOTE — Assessment & Plan Note (Signed)
Patient has Stage III adenocarcinoma, following with [facility]. Patient is on abraxane/gemcitabine for curative treatment. Patient was diagnosed in 08/12/22, had Whipple procedure, and is now on chemotherapy. Patient had CT abdomen that showed worsening of disease on admission today, with infiltrative metastatic disease in the surgical site soft tissues, and possibly liver. According to care giver, patient has not had last 2-3 treatments, that he get's ~ 2 weeks. Will also want to have GOC meeting given dementia, and it's effect on receiving treatments for his cancer. -Consider Hem/Onc consult -Consider restarting abraxane/gemcitabine while inpatient -GOC discussion

## 2023-01-14 NOTE — Assessment & Plan Note (Signed)
Patient with known history of dementia, and continues to have behavioral disturbances centered around smoking. Patient is A&O x 4 today, in pleasant mood, no concern for AMS at this time. Will continue to monitor -Zyprexa 10 mg daily -Zyprexa 5 mg prn, for agitation

## 2023-01-15 ENCOUNTER — Other Ambulatory Visit (HOSPITAL_COMMUNITY): Payer: Self-pay

## 2023-01-15 DIAGNOSIS — Z515 Encounter for palliative care: Secondary | ICD-10-CM

## 2023-01-15 DIAGNOSIS — F17200 Nicotine dependence, unspecified, uncomplicated: Secondary | ICD-10-CM

## 2023-01-15 DIAGNOSIS — K5900 Constipation, unspecified: Secondary | ICD-10-CM | POA: Diagnosis not present

## 2023-01-15 DIAGNOSIS — R410 Disorientation, unspecified: Secondary | ICD-10-CM | POA: Diagnosis not present

## 2023-01-15 LAB — BASIC METABOLIC PANEL
Anion gap: 9 (ref 5–15)
BUN: 22 mg/dL (ref 8–23)
CO2: 22 mmol/L (ref 22–32)
Calcium: 9.1 mg/dL (ref 8.9–10.3)
Chloride: 106 mmol/L (ref 98–111)
Creatinine, Ser: 0.85 mg/dL (ref 0.61–1.24)
GFR, Estimated: >60 mL/min (ref >60)
Glucose, Bld: 104 mg/dL — ABNORMAL HIGH (ref 70–99)
Potassium: 3.5 mmol/L (ref 3.5–5.1)
Sodium: 137 mmol/L (ref 135–145)

## 2023-01-15 MED ORDER — NICOTINE POLACRILEX 2 MG MT LOZG
2.0000 mg | LOZENGE | OROMUCOSAL | Status: DC | PRN
  Administered 2023-01-15: 2 mg via ORAL
  Filled 2023-01-15: qty 1

## 2023-01-15 MED ORDER — DOCUSATE SODIUM 100 MG PO CAPS: 100.0000 mg | ORAL_CAPSULE | Freq: Two times a day (BID) | ORAL | Status: DC

## 2023-01-15 MED ORDER — POLYETHYLENE GLYCOL 3350 17 G PO PACK: 17.0000 g | PACK | Freq: Two times a day (BID) | ORAL | Status: DC

## 2023-01-15 MED ORDER — LIDOCAINE 5 % EX PTCH
1.0000 | MEDICATED_PATCH | CUTANEOUS | Status: DC
  Administered 2023-01-15 – 2023-01-23 (×5): 1 via TRANSDERMAL
  Filled 2023-01-15 (×6): qty 1

## 2023-01-15 MED ORDER — NICOTINE POLACRILEX 2 MG MT LOZG
2.0000 mg | LOZENGE | OROMUCOSAL | Status: DC | PRN
  Filled 2023-01-15: qty 1

## 2023-01-15 MED ORDER — SENNA 8.6 MG PO TABS: 1.0000 | ORAL_TABLET | Freq: Every day | ORAL | Status: DC

## 2023-01-15 NOTE — Consult Note (Signed)
Kerry [name] Psychiatry Consult Evaluation  Service Date: January 15, 2023 LOS:  LOS: 1 day    Primary Psychiatric Diagnoses  Bipolar 1 disorder  Assessment  Kerry [name] is a 77 y.o. male admitted medically on 01/14/2023  1:20 AM for CVA. He carries the psychiatric diagnoses of bipolar 1 disorder and has a past medical history of pancreatic cancer and seizure disorder. Psychiatry was previously consulted for Bipolar 1, dementia, delirium in a previous hospitalization by Dr. Marsh Dolly on 8/21, now reconsulted due to concern with IVC.    Hospital course in previous admission: Patient has a previous diagnosis of bipolar 1 disorder and according to patient's power of attorney and caregiver, it is unclear if patient has been appropriately taking his prescribed medications for psychiatric conditions.  He has also been taking unprescribed Ambien for a long period of time putting him at risk of withdrawal. Current outpatient psychotropic medications include trazodone, Wellbutrin, Lexapro, melatonin, Remeron, Seroquel, and Depakote and historically he has had a fair response to these medications. Per caregiver, patient has a history of slow processing cognitive decline until about 2 weeks ago in which he exhibited behaviors of hypersexuality and combativeness which was more of a drastic change. This may be due to withdrawal from Ambien use or over/under medicating which can also lead to mentation changes. After speaking with patient's power of attorney/caregiver, we agreed to start an Ambien taper due to risk of withdrawal.  We started and increased Zyprexa for mood stabilization at this time as he was previously on this medication when he was hospitalized last year and seems to have benefited from the medication. Continued Depakote,  Depakote level on 8/22 was 62. Discontinued Seroquel, trazodone, and Remeron due to risk of oversedation and polypharmacy.  We started agitation protocol and provide delirium  precautions recommendations. We discontinued patient's ropinirole and Ambien as patient has been tapering off these medications.   We agree with primary team's assessment that patient does not presently have capacity to sign himself out AMA as he does not articulate the consequences of leaving the hospital. He also states with the psychiatric team that he would like to leave the hospital to smoke cigarettes and denies wanting nicotine replacement.  With the risk of harm and poor outcome if patient leaves the hospital and his lack of capacity along with not having a safe disposition for the patient, we agree for patient to be IVC'ed.   Diagnoses:  Active Hospital problems: Principal Problem:   Constipation Active Problems:   Aggressive behavior   Moderate dementia with behavioral disturbance (HCC)   Support system deficit   Pancreatic cancer (HCC)     Plan   ## Psychiatric Medication Recommendations:  -- Agree with primary for Zyprexa 10 mg nightly for mood stabilization - Continue agitation protocol:      - Zyprexa 5 mg disintegrating tablet or IM Q8 as needed  - Recheck ammonia level  ## Medical Decision Making Capacity:  Capacity was not formally addressed during this encounter; however, the patient appeared to understand and participate in the discussion about their treatment plan.    ## Further Work-up:  -- most recent EKG on 437 had QtC of 8/22 -- Pertinent labwork reviewed earlier this admission includes: ammonia 74, AST and ALT WNL  ## Disposition:  -- There are no current psychiatric contraindications to discharge at this time  ## Behavioral / Environmental:  --  DELIRIUM RECS 1: Avoid benzodiazepines, antihistamines, anticholinergics, and minimize opiate use as these  may worsen delirium. 2:Assess, prevent and manage pain as lack of treatment can result in delirium.  3: Recommend consult to PT/OT if not already done. Early mobility and exercise has been shown to  decrease duration of delirium.  4:Provide appropriate lighting and clear signage; a clock and calendar should be easily visible to the patient. 5:Monitor environmental factors. Reduce light and noise at night (close shades, turn off lights, turn off TV, ect). Correct any alterations in sleep cycle. 6: Reorient the patient to person, place, time and situation on each encounter.  7: Correct sensory deficits if possible (replace eye glasses, hearing aids, ect). 8: Avoid restraints. Severely delirious patients benefit from constant observation by a sitter. 9: Do not leave patient unattended.     ## Safety and Observation Level:  - Based on my clinical evaluation, I estimate the patient to be at low risk of self harm in the current setting - At this time, we recommend a regular level of observation. This decision is based on my review of the chart including patient's history and current presentation, interview of the patient, mental status examination, and consideration of suicide risk including evaluating suicidal ideation, plan, intent, suicidal or self-harm behaviors, risk factors, and protective factors. This judgment is based on our ability to directly address suicide risk, implement suicide prevention strategies and develop a safety plan while the patient is in the clinical setting. Please contact our team if there is a concern that risk level has changed.  Suicide risk assessment  Patient has following modifiable risk factors for suicide: social isolation and medication noncompliance, which we are addressing by adjusting medications.   Patient has following non-modifiable or demographic risk factors for suicide: male gender and psychiatric hospitalization  Patient has the following protective factors against suicide: Supportive friends   Thank you for this consult request. Recommendations have been communicated to the primary team.  We will sign off at this time.   Lance Muss,  MD  Psychiatric and Social History   Relevant Aspects of Hospital Course:  Admitted on 01/14/2023 for constipation.   Patient Report:  Patient seen this morning at bedside.  Patient states he wants to leave the hospital and go to a hotel.  I discussed that the primary team wants to continue seeing the patient due to his constipation and they are still needs to [facility] disposition planning prior to him leaving the hospital.  Patient was adamant that these issues have been fixed and that he can go to a hotel.  When asked what would be the consequences of him leaving AGAINST MEDICAL ADVICE, he stated there would be no risk and was unable to verbalize potential consequences if he did not stay to ensure safe disposition planning and further management of his constipation.  He agrees to continue communication with his caregiver with safety planning.  I discussed that prior to his previous hospital admission, his caregiver stated he was having difficulty managing his medications and that it will be important that his medications are managed appropriately when he leaves the hospital.  He denies that he had issues with taking his medications on his own.  He denies SI, HI, and AVH.  Psychiatric ROS Mood Symptoms Sleep disturbances (insomnia or hypersomnia)  Manic Symptoms Hypersexuality, impulsiveness  Collateral information:  Contacted [name] (caregiver/power of attorney) at [phone] on 8/21.  She states that the patient has had a progressively steady decline for a long time but noticed a drastic change in his behavior about  2 weeks ago in which she describes the patient is combative, talking sexually to the staff in his living facility, and smoking in the building when he knows he is not supposed to.  She recently became his power of attorney 2 weeks ago.  She states that she attempts to manage his medications but he is unwilling.  She is unsure if he appropriately takes his prescribed medication and  there is a likelihood that he is under or overdosing himself.  She also states that the patient received a phone call in which she answered and it was a man trying to sell him more Ambien as the man stated that he knew the patient ran out of Ambien.  Psychiatric History:  Information collected from the patient and collateral  Prev Dx/Sx: Bipolar 1 disorder Current Psych Provider: Sees a psychiatrist at the [facility] Current Meds: Trazodone, Wellbutrin, Lexapro, melatonin, Remeron, Seroquel, trazodone Previous Med Trials: Ambien, olanzapine, gabapentin  Prior Psych Hospitalization: Previously was hospitalized in the [place] hospital 08/3021 Prior Self Harm: Denies Prior Violence: Denies  Family Psych History: Denies Family Hx suicide: Denies  Social History:  Living Situation: Previously lived in [place] but not allowed back Access to weapons: Denies    Tobacco use: Yes, 1-2 PPD, would smoke "every 20 mins" if he could  Alcohol use: No  Other Substance use: Unprescribed Ambien use, states taking "20 mg daily"   Exam Findings   Psychiatric Specialty Exam:  Presentation  General Appearance:  Appropriate for Environment  Eye Contact: Fair  Speech: Clear and Coherent  Speech Volume: Normal  Handedness: Right   Mood and Affect  Mood: Irritable  Affect: Congruent   Thought Process  Thought Processes: Coherent  Descriptions of Associations: Intact  Orientation: Full (Time, Place and Person)  Thought Content: Logical  History of Schizophrenia/Schizoaffective disorder: No  Hallucinations:Hallucinations: None   Ideas of Reference: None  Suicidal Thoughts:Suicidal Thoughts: No   Homicidal Thoughts:Homicidal Thoughts: No    Sensorium  Memory: Immediate Good; Recent Good; Remote Good  Judgment: Poor  Insight: Lacking   Executive Functions  Concentration: Fair  Attention Span: Fair  Recall: Good  Fund of  Knowledge: Good  Language: Good   Psychomotor Activity  Psychomotor Activity:Psychomotor Activity: Normal    Assets  Assets: Social Support   Sleep  Sleep:Sleep: Fair     Physical Exam: Vital signs:  Temp:  [97.5 F (36.4 C)-98.4 F (36.9 C)] 98.4 F (36.9 C) (08/27 0758) Pulse Rate:  [78-98] 88 (08/27 0810) Resp:  [14-20] 17 (08/27 0810) BP: (99-116)/(60-69) 116/69 (08/27 0758) SpO2:  [98 %-100 %] 100 % (08/27 0810) Physical Exam Vitals reviewed.  Constitutional:      General: He is not in acute distress. HENT:     Head: Normocephalic and atraumatic.  Cardiovascular:     Rate and Rhythm: Normal rate.  Pulmonary:     Effort: Pulmonary effort is normal.  Neurological:     Mental Status: He is alert and oriented to person, place, and time.  Psychiatric:        Behavior: Behavior is agitated.     Blood pressure 116/69, pulse 88, temperature 98.4 F (36.9 C), temperature source Oral, resp. rate 17, height 5\' 6"  (1.676 m), weight 63.5 kg, SpO2 100%. Body mass index is 22.6 kg/m.   Other History   These have been pulled in through the EMR, reviewed, and updated if appropriate.   Family History:  The patient's family history is not on file.  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   Dementia (HCC)    Seizures (HCC)    Sleep apnea     Surgical History: Past Surgical History:  Procedure Laterality Date   BILIARY BRUSHING  10/23/2021   Procedure: BILIARY BRUSHING;  Surgeon: Meridee Score Netty Starring., MD;  Location: [facility] ENDOSCOPY;  Service: Gastroenterology;;   BILIARY STENT PLACEMENT  10/23/2021   Procedure: BILIARY STENT PLACEMENT;  Surgeon: Lemar Lofty., MD;  Location: [facility] ENDOSCOPY;  Service: Gastroenterology;;   BIOPSY  10/23/2021   Procedure: BIOPSY;  Surgeon: Lemar Lofty., MD;  Location: [facility] ENDOSCOPY;  Service:  Gastroenterology;;   ERCP N/A 10/23/2021   Procedure: ENDOSCOPIC RETROGRADE CHOLANGIOPANCREATOGRAPHY (ERCP);  Surgeon: Lemar Lofty., MD;  Location: [facility] ENDOSCOPY;  Service: Gastroenterology;  Laterality: N/A;   ESOPHAGOGASTRODUODENOSCOPY (EGD) WITH PROPOFOL N/A 10/23/2021   Procedure: ESOPHAGOGASTRODUODENOSCOPY (EGD) WITH PROPOFOL;  Surgeon: Meridee Score Netty Starring., MD;  Location: [facility] ENDOSCOPY;  Service: Gastroenterology;  Laterality: N/A;   FINE NEEDLE ASPIRATION  10/23/2021   Procedure: FINE NEEDLE ASPIRATION (FNA) LINEAR;  Surgeon: Lemar Lofty., MD;  Location: [facility] ENDOSCOPY;  Service: Gastroenterology;;   REMOVAL OF STONES  10/23/2021   Procedure: REMOVAL OF SLUDGE;  Surgeon: Lemar Lofty., MD;  Location: [facility] ENDOSCOPY;  Service: Gastroenterology;;   [name]  10/23/2021   Procedure: [name];  Surgeon: Lemar Lofty., MD;  Location: [facility] ENDOSCOPY;  Service: Gastroenterology;;   UPPER ESOPHAGEAL ENDOSCOPIC ULTRASOUND (EUS) N/A 10/23/2021   Procedure: UPPER ESOPHAGEAL ENDOSCOPIC ULTRASOUND (EUS);  Surgeon: Lemar Lofty., MD;  Location: [facility] ENDOSCOPY;  Service: Gastroenterology;  Laterality: N/A;    Medications:   Current Facility-Administered Medications:    ascorbic acid (VITAMIN C) tablet 500 mg, 500 mg, Oral, Daily, Sowell, Brandon, MD, 500 mg at 01/15/23 1049   aspirin EC tablet 81 mg, 81 mg, Oral, Daily, Sowell, Brandon, MD, 81 mg at 01/15/23 1049   cholecalciferol (VITAMIN D3) 25 MCG (1000 UNIT) tablet 2,000 Units, 2,000 Units, Oral, QHS, Sowell, Apolinar Junes, MD, 2,000 Units at 01/14/23 2205   clopidogrel (PLAVIX) tablet 75 mg, 75 mg, Oral, Daily, Sowell, Brandon, MD, 75 mg at 01/15/23 1049   divalproex (DEPAKOTE ER) 24 hr tablet 1,000 mg, 1,000 mg, Oral, QHS, Sowell, Brandon, MD, 1,000 mg at 01/14/23 2206   donepezil (ARICEPT) tablet 5 mg, 5 mg, Oral, q morning, Sowell, Brandon, MD, 5 mg at 01/15/23 1049   enoxaparin (LOVENOX) injection 40  mg, 40 mg, Subcutaneous, Q24H, Sowell, Brandon, MD, 40 mg at 01/15/23 1048   feeding supplement (ENSURE ENLIVE / ENSURE PLUS) liquid 237 mL, 237 mL, Oral, TID BM, Sowell, Brandon, MD, 237 mL at 01/15/23 1050   finasteride (PROSCAR) tablet 5 mg, 5 mg, Oral, Daily, Sowell, Brandon, MD, 5 mg at 01/15/23 1049   levETIRAcetam (KEPPRA) tablet 1,500 mg, 1,500 mg, Oral, BID, Sowell, Brandon, MD, 1,500 mg at 01/15/23 1049   multivitamin with minerals tablet 1 tablet, 1 tablet, Oral, Daily, Sowell, Brandon, MD, 1 tablet at 01/15/23 1050   nicotine (NICODERM CQ - dosed in mg/24 hours) patch 21 mg, 21 mg, Transdermal, Daily, Sowell, Brandon, MD, 21 mg at 01/15/23 1052   nicotine polacrilex (COMMIT) lozenge 2 mg, 2 mg, Oral, PRN, Everhart, Kirstie, DO, 2 mg at 01/15/23 1048   nicotine polacrilex (NICORETTE) gum 2 mg, 2 mg, Oral, PRN, Bess Kinds,  MD   OLANZapine zydis (ZYPREXA) disintegrating tablet 5 mg, 5 mg, Oral, Q8H PRN, 5 mg at 01/15/23 1049 **OR** OLANZapine (ZYPREXA) injection 5 mg, 5 mg, Intramuscular, Q8H PRN, Alicia Amel, MD   OLANZapine (ZYPREXA) tablet 10 mg, 10 mg, Oral, QHS, Sowell, Brandon, MD, 10 mg at 01/14/23 2205   rosuvastatin (CRESTOR) tablet 5 mg, 5 mg, Oral, QHS, Sowell, Brandon, MD, 5 mg at 01/14/23 2205   umeclidinium bromide (INCRUSE ELLIPTA) 62.5 MCG/ACT 1 puff, 1 puff, Inhalation, Daily, Bess Kinds, MD, 1 puff at 01/15/23 0810  Allergies: Allergies  Allergen Reactions   Fluoride Other (See Comments)    Seizures and stroke after dental visit from using fluoride paste (per pt)

## 2023-01-15 NOTE — Progress Notes (Signed)
Pt is alert and oriented x 4 at this time. Pt is insisting to leave and go home. Notified MD on call. New orders received.

## 2023-01-15 NOTE — Significant Event (Signed)
Brief Progress Note:   Called due to patient wanting to smoke a cigarette and leave the floor. Approached patient to discuss his concerns on 72 [place]. Patient asked to go outside for 4 mins to smoke a cigarette with supervision. I and Dr. Birdena Crandall discussed that he was not allowed to leave the floor to smoke and no one could supervise. Expressed concern for patients safety and others. Patient responded "you do not have to worry about me, I just want to smoke a cigarette, that is the only thing that will calm me down" Patient stated,"he would become aggressive, if he couldn't leave to smoke".  Patient was told that if he left the floor we would have to IVC, he stated " I'm here voluntarily". Patient perseverated on smoking and continued to discuss frustrations with hospitalization, lack of freedom and desire for pancreatic cancer treatment. Discussed, our team's conversation with his POA about pursuing comfort treatment vs curative treatment for cancer.  Patient says that he is safe however also says that he would become aggressive.  He is not able to explain to [place] his rationale, degree of his disease (bipolar, dementia, and pancreatic cancer), risks of treatment or risks of not receiving treatment.  He is also a safety risk to the staff.  Were able to get him lozenges, he calmed down after talking with bout his life endeavors and played his favorite music. Patient said he would go to his room if he can have something for his pancreatic cancer.  States that "my brother had cancer and received a patch on his stomach that cured his cancer.  I want a patch that can fix my cancer.) I told patient that we could have a patch that would help him feel better.  [facility] also [facility]  I asked nursing to give patient a nicotine patch with a Tegaderm cover to place on his abdomen.  Patient agreed with this patch.  Patient did start to feel better  after having some of the lozenges.  He expressed that he did not want narcotics however was open to multiple modalities including lidocaine patch and heating pad for back pain.  Decision being assessed: leaving AMA, not receiving bipolar/dementia treatment, receiving aggressive chemotherapy for cancer  In an evaluation of capacity, each of the following criteria must be met based for a patient to have capacity to make the decision in question.   Criterion 1: The patient demonstrates a clear and consistent voluntary choice with regard to treatment options. Yes Criterion 2: The patient adequately understands the disease they have, the treatment proposed, the risks of treatment, and the risks of other treatment (including no treatment). No Criterion 3: The patient acknowledges that the details of Criterion 2 apply to them specifically and the likely consequences of treatment options proposed. No Criterion 4: The patient demonstrates adequate reasoning/rationality within the context of their decision and can provide justification for their choice. No  In this case, the patient lacks capacity to decide to not receive treatment for bipolar/dementia, discharge, continue aggressive chemotherapy.   See patient interview for details. Of note, this capacity evaluation assesses only for the specified decision documented above at the time of the assessment and is not a substitute for determination of the patient's overall competency, which can only be adjudicated.  Patient's healthcare power of attorney Artist Pais was notified earlier of patient's mental status.  She is in agreements  with medical plan.  She states that patient should be DNR and palliative care and believes that this is what patient would want if he had capacity and was within his normal sound state of mind.  Pending palliative care discussions with palliative medicine team and HCPOA tomorrow.  A/P - IVC initiated - Will require 1 to 1  sitter - Patient has lozenges in room, to decrease smoking cravings, can have up to 20 lozenges/day.   - Patient can leave the room and walk about the unit with supervision. - MM pain control: lidocaine patch, heating pad, Tylenol -If becomes agitated, please try to redirect using music, asking him for stories or just to talk, offer nicotine lozenges or gum, or use as needed Zyprexa

## 2023-01-15 NOTE — Progress Notes (Signed)
Goals of care discussion with medical power of attorney [name] on the phone.  [name] believes that patient should be a DNR, and does not need aggressive treatment of his pancreatic cancer.  [name] would like a palliative care consult to discuss comfort measures, and would like to be present when palliative care comes by to talk with patient.  States that patient has no family involved in any of his care.  She makes all of his medical decisions.  [name] said that patient has been approved by the [place] for a facility, she is just looking for a facility to take him at this time.  That is our current barrier to discharge.

## 2023-01-15 NOTE — Progress Notes (Signed)
Daily Progress Note Intern Pager: [phone]  Patient name: Kerry [name] [facility] Medical record number: [id number] Date of birth: 08/13/1945 Age: 77 y.o. Gender: male  Primary Care Provider: [facility] Consultants: psychiatry Code Status: Full Code  Pt Overview and Major Events to Date:  8/26 - admitted  Assessment and Plan:  77 year old male who presented with worsening constipation.  Notably, patient was recently discharged from the hospital following an acute CVA.  Differentials for this patient's constipation included minimal movement and recent hospitalization, antipsychotics, potential decreased p.o. intake given increased agitation at his assisted living facility, pancreatic cancer with new imaging concerning for recurrence with metastasis.  Did report that he had a bowel movement this morning, and he would like to discontinue laxatives at this time.  He states that he was brought to the hospital for multiple seizures at his facility.  He also states that he is here because he would like a "patch" to cure his pancreatic cancer.  Will plan to discuss goals of care with patient's caregiver regarding likely poor prognosis, dementia, and full code status.   In an evaluation of capacity, each of the following criteria must be met based on medical necessity in order for a patient to have capacity to make the decision in question. Of note, the capacity evaluation assesses only for the specified decision documented above and is not a determination of the patient's overall competency, which can only be adjudicated.  Criterion 1: The patient demonstrates a clear and consistent voluntary choice with regard to treatment options.  Criterion 2: The patient adequately understands the disease they have, the treatment proposed, the risks of treatment, and the risks of other treatment (including no treatment).  Criterion 3: The patient acknowledges that the details of Criterion 2  apply to them specifically and the likely consequences of treatment options proposed.  Criterion 4: The patient demonstrates adequate reasoning/rationality within the context of their decision and can provide justification for their choice.  In this case, the patient does not capacity to decide his plan of care and plan for disposition as he fails to fulfill criteria 1-4.  Patient does not have a consistent voluntary choice with regard to treatment options, initially stated to other staff that his cancer would go away on its own, stating to me this morning that a patch can go over his abdomen and cure it.  Patient does not adequately understand the disease that he has and cannot demonstrate adequate reasoning or justification for his choices. Assessment & Plan Constipation Patient presented with constipation x3-4 days and abdominal pain.  Patient had CT abdomen that displayed significant stool burden in colon. Patient vitals otherwise stable, no concern for SBO, or perforation given imaging, and normal labs. Of note, pt recently hospitalized with minimal OOB and on antipsychotics, and potentially decreased PO intake at ILF. Also with pancreatic cancer with concern for liver mets.  -Patient states that he had a bowel movement this morning and does not want to continue laxatives. Aggressive behavior According to POA [name], patient had issues with aggression at his facility.  This includes cussing, yelling, throwing things if he is not allowed to smoke.  Patient also with history of attempting to smoke endorse despite facility not permitting. [name] reports patient having issues with staying at current ALF, thus she is looking through alternate facility through [place]. -1:1 sitter -Delirium precautions -Up with assistance -Patient adamantly refuses nicotine gum and patches.  Pharmacy was able to get nicotine  lozenges brought to patient today.  Very grateful for pharmacy's assistance in the care  of this patient. -Psychiatry consult was placed today for med optimization, appreciate recs -Zyprexa 10 mg daily -Zyprexa 5 mg prn for agitation Moderate dementia with behavioral disturbance (HCC) Patient with known history of dementia, and continues to have behavioral disturbances centered around smoking. Patient is A&O x 4 today, in pleasant mood, no concern for AMS at this time. Will continue to monitor -Zyprexa 10 mg daily -Zyprexa 5 mg prn, for agitation Support system deficit Patient has no family support, and receives all his care from his medical care giver/POA [name]. She is having a difficult time with his behaviors. Pancreatic cancer [facility]) Patient has Stage III adenocarcinoma, following with [facility]. Patient is on abraxane/gemcitabine for curative treatment. Patient was diagnosed in 08/12/22, had Whipple procedure, and is now on chemotherapy. Patient had CT abdomen that showed worsening of disease on admission today, with infiltrative metastatic disease in the surgical site soft tissues, and possibly liver. According to care giver, patient has not had last 2-3 treatments, that he get's them in ~ 2 weeks. Patient does not appear to fully understand his disease and treatment options. -Will contact pt's caregiver/POA [name] today to discuss goals of care for patient. Depending on that conversation can consider palliative care consult vs oncology consult.  Chronic and Stable Problems:  MCA CVA: stable, aspirin 81 and Plavix 75 daily Epilepsy: Depakote at 1000 mg daily, Keppra 1500 twice daily Tobacco use disorder: Smokes 2 to 4 packs/day   FEN/GI: regular PPx: lovenox Dispo:pending clinical improvement . Barriers include discharge facility.   Subjective:  No complaints this morning.  Last night it was reported the patient began demanding to leave the hospital and go to a hotel.  His primary reasoning for this was that he cannot smoke cigarettes here.  IVC was  initially considered but not placed.  Patient does not remember this.  He also states this morning that the reason he was brought back to the hospital was due to multiple seizures in his facility.  Patient states that he had a bowel movement this morning and does not want any more laxatives.  He also states that he would like a "patch "to put over his abdomen that will cure his cancer.  States that if we do not provide this here he will go to [facility].  Objective: Temp:  [97.5 F (36.4 C)-98.4 F (36.9 C)] 98.4 F (36.9 C) (08/27 0758) Pulse Rate:  [78-98] 88 (08/27 0810) Resp:  [14-20] 17 (08/27 0810) BP: (99-116)/(60-69) 116/69 (08/27 0758) SpO2:  [98 %-100 %] 100 % (08/27 0810) Physical Exam: General: Well-appearing, no acute distress Cardiovascular: Regular rate and rhythm, no murmurs Respiratory: Occasional scattered wheeze, no increased work of breathing Abdomen: Soft, diffusely tender, nondistended, normal bowel sounds Extremities: No leg swelling, moves all 4 extremities spontaneously  Laboratory: Most recent CBC Lab Results  Component Value Date   WBC 6.4 01/14/2023   HGB 10.8 (L) 01/14/2023   HCT 33.2 (L) 01/14/2023   MCV 91.7 01/14/2023   PLT 108 (L) 01/14/2023   Most recent BMP    Latest Ref Rng & Units 01/15/2023    7:26 AM  BMP  Glucose 70 - 99 mg/dL 782   BUN 8 - 23 mg/dL 22   Creatinine 9.56 - 1.24 mg/dL 2.13   Sodium 086 - 578 mmol/L 137   Potassium 3.5 - 5.1 mmol/L 3.5   Chloride 98 -  111 mmol/L 106   CO2 22 - 32 mmol/L 22   Calcium 8.9 - 10.3 mg/dL 9.1     Imaging/Diagnostic Tests: CT head wo contrast IMPRESSION: Unchanged right insular and frontal cortex edema. No new or progressive finding  CT abd pelvis w contrast IMPRESSION: 1. Diffuse colonic stool suggesting constipation. No small bowel obstruction. 2. Pancreas cancer with resection but extensive infiltrative soft tissue in the operative region concerning for recurrence. There  also low-density lesions scattered in the liver compatible with metastatic disease.  Kiran Lapine, DO 01/15/2023, 10:41 AM  PGY-1, [facility] Family Medicine FPTS Intern pager: [phone], text pages welcome Secure chat group [facility] Teaching Service

## 2023-01-15 NOTE — Hospital Course (Addendum)
Kerry [name] is a 77 y.o.male with a history of pancreatic cancer, moderate dementia with behavioral disturbance, MCA CVA, epilepsy, tobacco use disorder who was admitted to the family medicine teaching Service at [facility] for constipation. His hospital course is detailed below:  Constipation Patient initially presented with worsening constipation.  Patient received 1 dose of senna and reportedly had a bowel movement on 8/27.  Declined any more treatment for constipation.  Dementia with behavioral disturbance During patient's first night here, he began demanding to leave the hospital because he could not smoke cigarettes here.  Notably, similar issues occurred with patient during his last hospital stay.  Nicotine lozenges were provided to patient on 8/27.  Psychiatry was consulted for medication optimization, agreed with regimen, and signed off.  Patient's behavior did improve after being provided with nicotine lozenges.  It also improved once he started receiving Ambien nightly to help him sleep.  Pancreatic cancer Patient's CT scan on admission concerning for pancreatic cancer with resection but extensive infiltrative soft tissue in the operative region concerning for recurrence, also with scattered lesions in the liver compatible with metastatic disease.  Patient does not have a clear understanding of his disease or treatment of the disease.  Decision was made by healthcare power of attorney to make patient DNR and not continue cancer treatment.  Of note, patient has previously missed several chemo treatments due to his behavior.  He was also not tolerating chemo well.  His hospital course was prolonged due to difficulty finding placement for patient.  He does require a locked unit and was denied from in-network [place] facilities due to behavior.   Other chronic conditions were medically managed with home medications and formulary alternatives as necessary (Epilepsy, hx CVA, tobacco use  disorder)  PCP Follow-up Recommendations:

## 2023-01-15 NOTE — Plan of Care (Signed)
  Problem: Pain Managment: Goal: General experience of comfort will improve Outcome: Progressing   Problem: Safety: Goal: Ability to remain free from injury will improve Outcome: Progressing   

## 2023-01-15 NOTE — Progress Notes (Signed)
   Palliative Medicine Inpatient Follow Up Note  The PMT has observed the consult for [facility] this afternoon.  I have called patients friend [name] - (identified as HCPOA though will need supporting documents). [name] has recently injured her arm and has an orthopaedist appoint today at 2:30.   We have agreed to meet tomorrow. I shared that a Palliative care team member will reach out tomorrow to determine a meeting time.  No Charge  ______________________________________________________________________________________ Lamarr Lulas [place] Palliative Medicine Team Team Cell Phone: [phone] Please utilize secure chat with additional questions, if there is no response within 30 minutes please call the above phone number  Palliative Medicine Team providers are available by phone from 7am to 7pm daily and can be reached through the team cell phone.  Should this patient require assistance outside of these hours, please call the patient's attending physician.

## 2023-01-15 NOTE — Progress Notes (Signed)
Pt is trying to leave the floor. Staff is trying to explain to him that it is important for him to get treatments. Pt started to get agitated and attempted to hit the staff with a walker. Security and supervisor is currently on the floor and is able to calm the pt down.  MD notified.

## 2023-01-15 NOTE — Assessment & Plan Note (Signed)
Patient has no family support, and receives all his care from his medical care giver/POA [name]. She is having a difficult time with his behaviors.

## 2023-01-15 NOTE — TOC Initial Note (Signed)
Transition of Care [facility]) - Initial/Assessment Note    Patient Details  Name: Kerry [name] MRN: [id number] Date of Birth: Dec 12, 1945  Transition of Care [facility]) CM/SW Contact:    Carley Hammed, LCSW Phone Number: 01/15/2023, 2:01 PM  Clinical Narrative:                 CSW was advised that pt has an open APS case and currently does not have decision making capacity. CSW spoke with pt's HCPOA [name] who confirmed pt is from [place], but she has been working with the [place] to find alternate placement for him, since this is no longer safe. She confirmed that there is an open APS case, but they have not been assisting in finding any placement.   Pt's [facility] social worker is Delila Pereyra ext [phone], VM left. [name] states that [employer] worker just needs an FL2, this will be completed when the level of care is determined.   [name] noting she will have a GOC discussion with Palliative care tomorrow. TOC will continue to work toward appropriate placement.   Expected Discharge Plan:  (TBD) Barriers to Discharge: Continued Medical Work up, English as a second language teacher, Other (must enter comment) (Placement)   Patient Goals and CMS Choice   CMS Medicare.gov Compare Post Acute Care list provided to:: Patient Represenative (must comment) [name]) Choice offered to / list presented to : [facility] POA / Guardian      Expected Discharge Plan and Services In-house Referral: Clinical Social Work, Hospice / Palliative Care   Post Acute Care Choice:  (TBD) Living arrangements for the past 2 months: Independent Living Facility                                      Prior Living Arrangements/Services Living arrangements for the past 2 months: Independent Living Facility Lives with:: Facility Resident Patient language and need for interpreter reviewed:: Yes Do you feel safe going back to the place where you live?: Yes      Need for Family Participation in Patient Care: Yes  (Comment) Care giver support system in place?: Yes (comment)   Criminal Activity/Legal Involvement Pertinent to Current Situation/Hospitalization: No - Comment as needed  Activities of Daily Living Home Assistive Devices/Equipment: Bedside commode/3-in-1, Eyeglasses, Grab bars around toilet ADL Screening (condition at time of admission) Patient's cognitive ability adequate to safely complete daily activities?: Yes Is the patient deaf or have difficulty hearing?: Yes (need hearing aids need to go to [facility] hospital) Does the patient have difficulty seeing, even when wearing glasses/contacts?: No Does the patient have difficulty concentrating, remembering, or making decisions?: Yes (short term memory problems) Patient able to express need for assistance with ADLs?: Yes Does the patient have difficulty dressing or bathing?: No Independently performs ADLs?: Yes (appropriate for developmental age) Communication: Independent Dressing (OT): Independent Grooming: Independent Feeding: Independent Bathing: Independent Toileting: Independent In/Out Bed: Independent Walks in Home: Independent Does the patient have difficulty walking or climbing stairs?: No Weakness of Legs: None Weakness of Arms/Hands: None  Permission Sought/Granted Permission sought to share information with : Other (comment) (HC POA)    Share Information with NAME: [name]     Permission granted to share info w Relationship: HCPOA     Emotional Assessment Appearance:: Appears stated age     Orientation: : Oriented to Self, Oriented to Place, Oriented to  Time, Oriented to Situation Alcohol /  Substance Use: Not Applicable Psych Involvement: Yes (comment)  Admission diagnosis:  Delirium [R41.0] Constipation [K59.00] Patient Active Problem List   Diagnosis Date Noted   Constipation 01/14/2023   Pancreatic cancer (HCC) 01/14/2023   Dysarthria 01/11/2023   Protein-calorie malnutrition, severe 01/09/2023    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 01/08/2023   Moderate dementia with behavioral disturbance (HCC) 01/08/2023   Support system deficit 01/08/2023   No contraindication to deep vein thrombosis (DVT) prophylaxis 01/07/2023   Stroke (HCC) 01/07/2023   Aggressive behavior 11/22/2021   Status epilepticus (HCC) 11/11/2021   Postictal confusion 11/11/2021   Altered mental status 11/02/2021   Generalized weakness    Jaundice    Dark stools 10/24/2021   Serum ammonia increased (HCC) 10/22/2021   Mixed hyperlipidemia 10/21/2021   Restless leg syndrome 10/21/2021   BPH (benign prostatic hyperplasia) 10/21/2021   Seizure disorder (HCC) 10/21/2021   Common bile duct obstruction    Pancreatic lesion    Cholestatic jaundice 10/20/2021   Bipolar 1 disorder (HCC) 10/20/2021   Hypokalemia 10/20/2021   Lesion of left native kidney 10/20/2021   PCP:  [facility] Pharmacy:   [facility] - [place], [place] - [address] [place] [address] Phone: [phone] Fax: [phone]  [name] [address] [place] [address] Phone: [phone] Fax: [phone]  [facility] [address] [place] [address] Phone: [phone] Fax: [phone]  [id number] - [address], [place] - [address] [place] [address] Phone: [phone] Fax: [phone]     Social Determinants of Health (SDOH) Social History: SDOH Screenings   Food Insecurity: No Food Insecurity (01/14/2023)  Housing: Low Risk  (01/14/2023)  Transportation Needs: No Transportation Needs (01/14/2023)  Utilities: Not At Risk (01/14/2023)  Financial Resource Strain: Medium Risk (08/28/2022)   Received [facility]  Social Connections: Unknown (10/02/2021)   Received from [facility], [facility]  Stress: Stress Concern Present (08/21/2022)   Received  from [facility], [facility]  Tobacco Use: High Risk (01/14/2023)   SDOH Interventions:     Readmission Risk Interventions     No data to display

## 2023-01-15 NOTE — Progress Notes (Signed)
   01/15/23 1648  Mobility  Activity Ambulated with assistance in hallway;Ambulated with assistance to bathroom  Level of Assistance Independent after set-up  Assistive Device Front wheel walker  Distance Ambulated (ft) 240 ft  Activity Response Tolerated well  Mobility Referral Yes  $Mobility charge 1 Mobility  Mobility Specialist Start Time (ACUTE ONLY) 1515  Mobility Specialist Stop Time (ACUTE ONLY) 1548  Mobility Specialist Time Calculation (min) (ACUTE ONLY) 33 min   Mobility Specialist: Progress Note  Pt agreeable to mobility session - received in bed. Ambulated well throughout with c/o wanting to go outside to smoke and being in a cold room. Pt took a seated break in hallway for about fifteen mins. Pt returned to couch with all needs met - call bell within reach. Chair alarm on.   Barnie Mort, BS Mobility Specialist Please contact via SecureChat or Rehab office at [phone].

## 2023-01-16 ENCOUNTER — Encounter (HOSPITAL_COMMUNITY): Payer: Self-pay | Admitting: Family Medicine

## 2023-01-16 DIAGNOSIS — Z515 Encounter for palliative care: Secondary | ICD-10-CM | POA: Diagnosis not present

## 2023-01-16 DIAGNOSIS — F03B18 Unspecified dementia, moderate, with other behavioral disturbance: Secondary | ICD-10-CM

## 2023-01-16 DIAGNOSIS — C259 Malignant neoplasm of pancreas, unspecified: Secondary | ICD-10-CM | POA: Diagnosis not present

## 2023-01-16 DIAGNOSIS — K5900 Constipation, unspecified: Secondary | ICD-10-CM | POA: Diagnosis not present

## 2023-01-16 DIAGNOSIS — Z7189 Other specified counseling: Secondary | ICD-10-CM

## 2023-01-16 DIAGNOSIS — R4689 Other symptoms and signs involving appearance and behavior: Secondary | ICD-10-CM | POA: Diagnosis not present

## 2023-01-16 MED ORDER — IBUPROFEN 800 MG PO TABS
800.0000 mg | ORAL_TABLET | Freq: Three times a day (TID) | ORAL | Status: DC | PRN
  Filled 2023-01-16: qty 1

## 2023-01-16 MED ORDER — MELATONIN 5 MG PO TABS
5.0000 mg | ORAL_TABLET | Freq: Every day | ORAL | Status: DC
  Administered 2023-01-16 – 2023-01-23 (×9): 5 mg via ORAL
  Filled 2023-01-16 (×9): qty 1

## 2023-01-16 MED ORDER — ACETAMINOPHEN 325 MG PO TABS
650.0000 mg | ORAL_TABLET | Freq: Four times a day (QID) | ORAL | Status: DC | PRN
  Administered 2023-01-22 – 2023-01-23 (×2): 650 mg via ORAL
  Filled 2023-01-16 (×2): qty 2

## 2023-01-16 NOTE — Assessment & Plan Note (Signed)
Patient has Stage III adenocarcinoma. Patient was diagnosed in 08/12/22, had Whipple procedure, and is now on chemotherapy. Has missed several chemo appointments due to his aggressive behavior. CT on admission with infiltrative metastatic disease in the surgical site soft tissues, and possibly liver mets. Discussed with HCPOA 8/27 who would like to proceed with comfort care measures - palliative care consulted, appreciate assistance with this patient

## 2023-01-16 NOTE — Assessment & Plan Note (Signed)
Patient with known history of dementia, and continues to have behavioral disturbances centered around smoking.  -Zyprexa 10 mg daily -Zyprexa 5 mg prn, for agitation

## 2023-01-16 NOTE — Assessment & Plan Note (Signed)
Patient has no family support, and receives all his care from his medical care giver/POA [name]. She is having a difficult time with his behaviors.

## 2023-01-16 NOTE — Consult Note (Cosign Needed Addendum)
Palliative Care Consult Note                                  Date: 01/16/2023   Patient Name: Kerry [name]  DOB: [birth year]  MRN: [id number]  Age / Sex: 77 y.o., male  PCP: [facility] Referring Physician: Caro Laroche, DO  Reason for Consultation: Establishing goals of care  HPI/Patient Profile: 77 y.o. male  with past medical history of pancreatic cancer stage III s/p Whipple and has been on chemotherapy, bipolar 1 disorder, seizure disorder, and dementia with behavioral disturbance who presented to the ED on 01/14/2023 with abdominal pain.  CT abdomen showed diffuse colonic stool suggesting constipation and negative for small bowel obstruction.  Imaging was also consistent with recurrence of pancreatic cancer as well as concern for liver metastasis. IVC was initiated on 8/27 due to patient's behavioral disturbances and desire to leave the hospital despite medical advice.  Palliative Medicine was consulted to assist with goals of care discussion.  Past Medical History:  Diagnosis Date   anxiety    Anxiety    Bipolar 1 disorder (HCC)    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   Constipation 01/14/2023   Dementia (HCC)    Seizures (HCC)    Sleep apnea     Subjective:   I have reviewed medical records including EPIC notes, labs and imaging, and discussed with Family Medicine teaching service.  Assessed the patient at bedside.  He has a 1:1 Comptroller.  He is awake, sitting on the edge of the bed, and eating his meal tray.  He tells me that his goal is to "get more exercise".  I met with patient's reported HCPOA Kerry [name] in the 6N conference room to discuss diagnosis, prognosis, GOC, disposition, and options.  I introduced Palliative Medicine as specialized medical care for people living with serious illness. It focuses on providing relief from the symptoms and stress of a serious illness.   Kerry [name]  confirms that she is patient's documented HCPOA and provides a copy of this document.  She shares that she met patient in February 2024, when he came to live at [facility], where she was assigned as his caregiver.   Kerry [name] shares that patient has no known family, other than his brother who she reports "wants nothing to do with him".  We discussed patient's current illness and what it means in the larger context of his ongoing co-morbidities. Current clinical status was reviewed. Natural disease trajectory of metastatic cancer was discussed.  Created space and opportunity for Kerry [name] to express thoughts and feelings regarding current patient's medical situation. Values and goals of care were attempted to be elicited.  We discussed different paths of care with regard to treatment preferences - full scope versus limited interventions (treat the treatable) versus comfort care.  We discussed concepts specific to code status, mechanical ventilation, IV fluids, artificial feeding, and rehospitalization.  Encouraged Kerry [name] to consider anticipatory care planning to outline what medical interventions patient would or would not want in the event his condition declines, keeping in mind the concept of quality of life. The MOST form was introduced and explained.  Questions and concerns addressed. I provided Kerry [name] with PMT contact info and encouraged her to call with additional questions or concerns.     Review of Systems  Unable to perform ROS   Objective:   Primary Diagnoses: Present  on Admission:  Aggressive behavior  Moderate dementia with behavioral disturbance [facility])   Physical Exam Vitals reviewed.  Constitutional:      General: He is not in acute distress.    Comments: Chronically ill-appearing  Pulmonary:     Effort: Pulmonary effort is normal.  Neurological:     Mental Status: He is alert.     Vital Signs:  BP 109/62 (BP Location: Right Arm)   Pulse 85   Temp 97.8 F (36.6 C) (Oral)    Resp 17   Ht 5\' 6"  (1.676 m)   Wt 63.5 kg   SpO2 100%   BMI 22.60 kg/m   Palliative Assessment/Data: PPS 50%     Assessment & Plan:   SUMMARY OF RECOMMENDATIONS   PMT is currently under IVC Agree that DNR status is medically appropriate in the setting of pancreatic cancer with probable liver metastasis PMT will continue to follow  Primary Decision Maker: HCPOA - Kerry [name] (copy of this document obtained to scan into EMR/Vynca)  Advanced Directives: Patient has HCPOA and living will documents from the [place] [facility] will reviewed. Patient expresses he would want life-sustaining treatments.  However, living will also specifies patient would want his preferences to serve as a general guide and that his HCPOA may decide something different if they think it is in his best interest.  Symptom Management:  Per primary team  Prognosis:  Unable to determine  Discharge Planning:  To Be Determined     Thank you for allowing [place] to participate in the care of [facility]   Time Total: 80 minutes  Greater than 50%  of this time was spent counseling and coordinating care related to the above assessment and plan.  Signed by: Sherlean Foot, NP Palliative Medicine Team  Team Phone # [phone]  For individual providers, please see AMION

## 2023-01-16 NOTE — Progress Notes (Addendum)
Mobility Specialist: Progress Note   01/16/23 1408  Mobility  Level of Assistance Contact guard assist, steadying assist  Assistive Device None  Distance Ambulated (ft) 650 ft  Activity Response Tolerated well  Mobility Referral Yes  $Mobility charge 1 Mobility  Mobility Specialist Start Time (ACUTE ONLY) 1146  Mobility Specialist Stop Time (ACUTE ONLY) 1212  Mobility Specialist Time Calculation (min) (ACUTE ONLY) 26 min    Pt was agreeable to mobility session - received ambulating in bathroom. Had c/o back pain rated 7/10 on pain scale. Took 2x seated break (~1 min each) d/t fatigue. Returned to room without fault. Left on EOB with all needs met, call bell in reach. Sitter in room.   Maurene Capes Mobility Specialist Please contact via SecureChat or Rehab office at [phone]

## 2023-01-16 NOTE — Progress Notes (Signed)
Daily Progress Note Intern Pager: [phone]  Patient name: Kerry [name] [facility] Medical record number: [id number] Date of birth: [birth year] Age: 77 y.o. Gender: male  Primary Care Provider: [facility] Consultants: psychiatry, palliative care Code Status: DNR  Pt Overview and Major Events to Date:  8/26 - admitted 8/27 - IVC placed  Assessment and Plan:  77 year old male who presented initially with worsening constipation and behavioral issues at living facility.  Notably, patient was recently discharged from the hospital following an acute CVA. Plan for today includes HCPOA [name] to have goals of care discussion with palliative care team and continue to work on finding placement for pt.  Assessment & Plan Constipation (Resolved: 01/16/2023) Patient presented with constipation x3-4 days and abdominal pain.  Patient had CT abdomen that displayed significant stool burden in colon. Patient vitals otherwise stable, no concern for SBO, or perforation given imaging, and normal labs. Of note, pt recently hospitalized with minimal OOB and on antipsychotics, and potentially decreased PO intake at ILF. Also with pancreatic cancer with concern for liver mets.  -Patient had BM 8/27 and does not want further laxatives or stool softeners Aggressive behavior According to POA [name], patient had issues with aggression at his facility surrounding not being able to smoke. [name] reports patient having issues with staying at current ALF, thus she is looking through alternate facility through [place]. -1:1 sitter -IVC in place by our team -Delirium precautions -Up with assistance -Nicotine lozenges PRN -Psychiatry consult for med optimization, appreciate recs -out of bed with supervision order placed, pt wants to walk around -Zyprexa 10 mg daily -Zyprexa 5 mg prn for agitation Moderate dementia with behavioral disturbance (HCC) Patient with known history of dementia, and  continues to have behavioral disturbances centered around smoking.  -Zyprexa 10 mg daily -Zyprexa 5 mg prn, for agitation Pancreatic cancer [facility]) Patient has Stage III adenocarcinoma. Patient was diagnosed in 08/12/22, had Whipple procedure, and is now on chemotherapy. Has missed several chemo appointments due to his aggressive behavior. CT on admission with infiltrative metastatic disease in the surgical site soft tissues, and possibly liver mets. Discussed with HCPOA 8/27 who would like to proceed with comfort care measures - palliative care consulted, appreciate assistance with this patient Support system deficit Patient has no family support, and receives all his care from his medical care giver/POA [name]. She is having a difficult time with his behaviors.  Chronic and Stable Problems:  MCA CVA: stable, aspirin 81 and Plavix 75 daily Epilepsy: Depakote at 1000 mg daily, Keppra 1500 twice daily Tobacco use disorder: Smokes 2 to 4 packs/day  FEN/GI: regular PPx: lovenox Dispo:SNF . Barriers include placement.   Subjective:  Patient feeling good this morning.  Eating breakfast.  Still wants a cigarette, told him he would not be getting 1 but he still has his lozenges.  Patient would like to go home, he said that it only takes 12 hours to cure his cancer.  Objective: Temp:  [97.5 F (36.4 C)-97.7 F (36.5 C)] 97.5 F (36.4 C) (08/28 0606) Pulse Rate:  [81-85] 82 (08/28 0606) Resp:  [16-20] 16 (08/28 0606) BP: (101-126)/(55-63) 101/55 (08/28 0606) SpO2:  [98 %-100 %] 98 % (08/28 0736) Physical Exam: General: Well-appearing, no acute distress.  Oriented to person and place but not situation Cardiovascular: Regular rate and rhythm, no murmurs Respiratory: Clear to auscultation bilaterally, no increased work of breathing Abdomen: Soft, nontender, nondistended Extremities: No leg swelling bilaterally, moves all extremities  spontaneously  Laboratory: Most recent CBC Lab Results   Component Value Date   WBC 6.4 01/14/2023   HGB 10.8 (L) 01/14/2023   HCT 33.2 (L) 01/14/2023   MCV 91.7 01/14/2023   PLT 108 (L) 01/14/2023   Most recent BMP    Latest Ref Rng & Units 01/15/2023    7:26 AM  BMP  Glucose 70 - 99 mg/dL 161   BUN 8 - 23 mg/dL 22   Creatinine 0.96 - 1.24 mg/dL 0.45   Sodium 409 - 811 mmol/L 137   Potassium 3.5 - 5.1 mmol/L 3.5   Chloride 98 - 111 mmol/L 106   CO2 22 - 32 mmol/L 22   Calcium 8.9 - 10.3 mg/dL 9.1    Imaging/Diagnostic Tests: None  Jojo Geving, DO 01/16/2023, 11:39 AM  PGY-1, [place] [facility] FPTS Intern pager: [phone], text pages welcome Secure chat group [facility] Teaching Service

## 2023-01-16 NOTE — Assessment & Plan Note (Addendum)
According to [facility] [name], patient had issues with aggression at his facility surrounding not being able to smoke. [name] reports patient having issues with staying at current ALF, thus she is looking through alternate facility through [place]. -1:1 sitter -IVC in place by our team -Delirium precautions -Up with assistance -Nicotine lozenges PRN -Psychiatry consult for med optimization, appreciate recs -out of bed with supervision order placed, pt wants to walk around -Zyprexa 10 mg daily -Zyprexa 5 mg prn for agitation

## 2023-01-16 NOTE — TOC Progression Note (Addendum)
Transition of Care [facility]) - Progression Note    Patient Details  Name: Kerry [name] MRN: [id number] Date of Birth: 14-Oct-1945  Transition of [facility]) CM/SW Contact  Carley Hammed, LCSW Phone Number: 01/16/2023, 12:02 PM  Clinical Narrative:    CSW advised that IVC was put in place last night, documents on chart. CSW attempted to contact [facility] L[facility] again to discuss disposition. HCPOA to discuss GOC with medical team. TOC will continue to follow for DC needs.   3:00 CSW spoke with pt's [facility] social worker who stated that pt had been approved to go to a facility, but would need a locked unit. Since pt is hospitalized, they would not continue to find placement, CSW to find placement. List for possible facilities over 9 counties and 3 states provided. [facility] CSW stated that [facility] did not have a bed at this time, and likely would not. CSW completed FL2 and sent a referral to a facility in county that has a locked unit. CSW to expand search if a local facility cannot be found. TOC will continue to follow for DC needs.   Expected Discharge Plan:  (TBD) Barriers to Discharge: Continued Medical Work up, English as a second language teacher, Other (must enter comment) Presenter, broadcasting)  Expected Discharge Plan and Services In-house Referral: Clinical Social Work, Hospice / Palliative Care   Post Acute Care Choice:  (TBD) Living arrangements for the past 2 months: Independent Living Facility                                       Social Determinants of Health (SDOH) Interventions SDOH Screenings   Food Insecurity: No Food Insecurity (01/14/2023)  Housing: Low Risk  (01/14/2023)  Transportation Needs: No Transportation Needs (01/14/2023)  Utilities: Not At Risk (01/14/2023)  Financial Resource Strain: Medium Risk (08/28/2022)   Received from [facility], [facility]  Social Connections: Unknown (10/02/2021)   Received from [facility], [facility]  Stress: Stress Concern Present (08/21/2022)    Received from [facility], [facility]  Tobacco Use: High Risk (01/14/2023)    Readmission Risk Interventions     No data to display

## 2023-01-16 NOTE — Assessment & Plan Note (Signed)
Patient presented with constipation x3-4 days and abdominal pain.  Patient had CT abdomen that displayed significant stool burden in colon. Patient vitals otherwise stable, no concern for SBO, or perforation given imaging, and normal labs. Of note, pt recently hospitalized with minimal OOB and on antipsychotics, and potentially decreased PO intake at ILF. Also with pancreatic cancer with concern for liver mets.  -Patient had BM 8/27 and does not want further laxatives or stool softeners

## 2023-01-16 NOTE — [place] FL2 (Signed)
[place] MEDICAID FL2 LEVEL OF CARE FORM     IDENTIFICATION  Patient Name: Kerry [name] Birthdate: 02-Sep-1945 Sex: male Admission Date (Current Location): 01/14/2023  [facility] and [place] Number:  Producer, television/film/video and Address:  [facility], [address] 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 01/08/2023   Moderate dementia with behavioral disturbance (HCC) 01/08/2023   Support system deficit 01/08/2023   No contraindication to deep vein thrombosis (DVT) prophylaxis 01/07/2023   Stroke (HCC) 01/07/2023   Aggressive behavior 11/22/2021   Status epilepticus (HCC) 11/11/2021   Postictal confusion 11/11/2021   Altered mental status 11/02/2021   Generalized weakness    Jaundice    Dark stools 10/24/2021   Serum ammonia increased (HCC) 10/22/2021   Mixed hyperlipidemia 10/21/2021   Restless leg syndrome 10/21/2021   BPH (benign prostatic hyperplasia) 10/21/2021   Seizure disorder (HCC) 10/21/2021   Common bile duct obstruction    Pancreatic lesion    Cholestatic jaundice 10/20/2021   Bipolar 1 disorder (HCC) 10/20/2021   Hypokalemia 10/20/2021   Lesion of left native kidney 10/20/2021    Orientation RESPIRATION BLADDER Height & Weight     Self, Place, Time, Situation  Normal Continent Weight: 140 lb (63.5 kg) Height:  5\' 6"  (167.6 cm)  BEHAVIORAL SYMPTOMS/MOOD NEUROLOGICAL BOWEL  NUTRITION STATUS  Other (Comment) (Requires a locked memory care unit)   Continent Diet (See DC summary)  AMBULATORY STATUS COMMUNICATION OF NEEDS Skin   Independent Verbally Normal                       Personal Care Assistance Level of Assistance  Bathing, Feeding, Dressing Bathing Assistance: Limited assistance Feeding assistance: Independent Dressing Assistance: Limited assistance     Functional Limitations Info  Sight, Hearing, Speech Sight Info: Adequate Hearing Info: Adequate Speech Info: Adequate    SPECIAL CARE FACTORS FREQUENCY  PT (By licensed PT), OT (By licensed OT)     PT Frequency: 5x week OT Frequency: 5x week            Contractures Contractures Info: Not present    Additional Factors Info  Code Status, Allergies, Psychotropic Code Status Info: DNR Allergies Info: Fluoride Psychotropic Info: Divaloprex, Donepezil, Olanzapine         Current Medications (01/16/2023):  This is the current hospital active medication list Current Facility-Administered Medications  Medication Dose Route Frequency Provider Last Rate Last Admin   acetaminophen (TYLENOL) tablet 650 mg  650 mg Oral Q6H PRN Hindel, Leah, MD       ascorbic acid (VITAMIN C) tablet 500 mg  500 mg Oral Daily Sowell, Brandon, MD   500 mg at 01/16/23 1033   aspirin EC tablet 81 mg  81 mg Oral Daily Bess Kinds, MD   81 mg at 01/16/23 1034   cholecalciferol (VITAMIN D3) 25 MCG (1000 UNIT) tablet 2,000 Units  2,000  Units Oral Jake Shark, MD   2,000 Units at 01/15/23 2050   clopidogrel (PLAVIX) tablet 75 mg  75 mg Oral Daily Bess Kinds, MD   75 mg at 01/16/23 1034   divalproex (DEPAKOTE ER) 24 hr tablet 1,000 mg  1,000 mg Oral QHS Bess Kinds, MD   1,000 mg at 01/15/23 2135   donepezil (ARICEPT) tablet 5 mg  5 mg Oral q morning Bess Kinds, MD   5 mg at 01/16/23 1034   enoxaparin (LOVENOX) injection 40 mg  40 mg Subcutaneous Q24H Bess Kinds, MD   40 mg at 01/16/23  1033   feeding supplement (ENSURE ENLIVE / ENSURE PLUS) liquid 237 mL  237 mL Oral TID BM Bess Kinds, MD   237 mL at 01/16/23 1034   finasteride (PROSCAR) tablet 5 mg  5 mg Oral Daily Sowell, Apolinar Junes, MD   5 mg at 01/16/23 1034   ibuprofen (ADVIL) tablet 800 mg  800 mg Oral Q8H PRN Hindel, Leah, MD       levETIRAcetam (KEPPRA) tablet 1,500 mg  1,500 mg Oral BID Bess Kinds, MD   1,500 mg at 01/16/23 1033   lidocaine (LIDODERM) 5 % 1 patch  1 patch Transdermal Q24H Lockie Mola, MD   1 patch at 01/15/23 2135   melatonin tablet 5 mg  5 mg Oral QHS Darnelle Spangle B, MD   5 mg at 01/16/23 8657   multivitamin with minerals tablet 1 tablet  1 tablet Oral Daily Bess Kinds, MD   1 tablet at 01/16/23 1033   nicotine (NICODERM CQ - dosed in mg/24 hours) patch 21 mg  21 mg Transdermal Daily Bess Kinds, MD   21 mg at 01/16/23 1034   nicotine polacrilex (COMMIT) lozenge 2 mg  2 mg Oral PRN Peterson Ao, MD       nicotine polacrilex (NICORETTE) gum 2 mg  2 mg Oral PRN Bess Kinds, MD       OLANZapine zydis (ZYPREXA) disintegrating tablet 5 mg  5 mg Oral Q8H PRN Alicia Amel, MD   5 mg at 01/15/23 2303   Or   OLANZapine (ZYPREXA) injection 5 mg  5 mg Intramuscular Q8H PRN Alicia Amel, MD       OLANZapine (ZYPREXA) tablet 10 mg  10 mg Oral QHS Bess Kinds, MD   10 mg at 01/15/23 2050   rosuvastatin (CRESTOR) tablet 5 mg  5 mg Oral QHS Bess Kinds, MD   5 mg at 01/15/23 2050   umeclidinium bromide (INCRUSE ELLIPTA) 62.5 MCG/ACT 1 puff  1 puff Inhalation Daily Bess Kinds, MD   1 puff at 01/16/23 8469     Discharge Medications: Please see discharge summary for a list of discharge medications.  Relevant Imaging Results:  Relevant Lab Results:   Additional Information SS# [id number] [place]

## 2023-01-17 DIAGNOSIS — G4700 Insomnia, unspecified: Secondary | ICD-10-CM | POA: Diagnosis not present

## 2023-01-17 DIAGNOSIS — R4689 Other symptoms and signs involving appearance and behavior: Secondary | ICD-10-CM | POA: Diagnosis not present

## 2023-01-17 LAB — GLUCOSE, CAPILLARY: Glucose-Capillary: 95 mg/dL (ref 70–99)

## 2023-01-17 MED ORDER — OLANZAPINE 10 MG IM SOLR: 5.0000 mg | Freq: Two times a day (BID) | INTRAMUSCULAR | Status: DC | PRN

## 2023-01-17 MED ORDER — ZOLPIDEM TARTRATE 5 MG PO TABS
2.5000 mg | ORAL_TABLET | Freq: Every day | ORAL | Status: DC
  Administered 2023-01-17 – 2023-01-23 (×7): 2.5 mg via ORAL
  Filled 2023-01-17 (×7): qty 1

## 2023-01-17 MED ORDER — OLANZAPINE 5 MG PO TBDP
5.0000 mg | ORAL_TABLET | Freq: Two times a day (BID) | ORAL | Status: DC | PRN
  Administered 2023-01-21 – 2023-01-24 (×3): 5 mg via ORAL
  Filled 2023-01-17 (×3): qty 1

## 2023-01-17 NOTE — Assessment & Plan Note (Signed)
Patient has Stage III adenocarcinoma. Patient was diagnosed in 08/12/22, had Whipple procedure, and is now on chemotherapy. Has missed several chemo appointments due to his aggressive behavior. CT on admission with infiltrative metastatic disease in the surgical site soft tissues, and possibly liver mets. Discussed with HCPOA 8/27 who would like to proceed with comfort care measures - palliative care consulted, appreciate assistance with this patient

## 2023-01-17 NOTE — Assessment & Plan Note (Signed)
Patient has no family support, and receives all his care from his medical care giver/POA [name]. She is having a difficult time with his behaviors.

## 2023-01-17 NOTE — Progress Notes (Addendum)
Daily Progress Note Intern Pager: [phone]  Patient name: Kerry [name] [facility] Medical record number: [id number] Date of birth: Jul 03, 1945 Age: 77 y.o. Gender: male  Primary Care Provider: Clinic, [facility] Consultants: psychiatry, palliative care Code Status: DNR  Pt Overview and Major Events to Date:  8/26 - admitted 8/27 - IVC placed  Assessment and Plan:  77 year old male who initially presented with constipation and behavioral issues at his living facility.  Constipation resolved while here.  Palliative care evaluated patient yesterday, spoke with him and his healthcare power of attorney/caregiver.  TOC is following to try to find placement for patient, as [facility] social [facility] said that they would not continue to find placement for him since he is hospitalized.  IVC in place.  Placement is our only barrier to discharge.  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   Spoke with palliative care NP this morning who saw patient yesterday, said that pt's living will states that he wants all life prolonging interventions but he did list that his HCPOA is to have the final say. Pt does not have capacity and palliative care agrees.  Assessment & Plan Aggressive behavior According to POA [name], patient had issues with aggression at his facility surrounding not being able to smoke. [name] reports patient having issues with staying at current ALF, thus she is looking through alternate facility through [place]. TOC has now taken over the search for placement as the [facility] said that they would not continue to look for placement given that he is hospitalized. -1:1 sitter -IVC in place by our team -Delirium precautions -Up with assistance -Nicotine lozenges PRN -Psychiatry was consulted for medication optimization, agree with his current management, signed off.  -out of bed with supervision order placed, pt wants to walk around -Zyprexa  10 mg daily -Zyprexa 5 mg 12h PRN for agitation -Ambien 2.5mg  for sleep Moderate dementia with behavioral disturbance (HCC) Patient with known history of dementia, and continues to have behavioral disturbances centered around smoking.  -Zyprexa 10 mg daily -Zyprexa 5 mg 12h PRN for agitation Pancreatic cancer [facility]) Patient has Stage III adenocarcinoma. Patient was diagnosed in 08/12/22, had Whipple procedure, and is now on chemotherapy. Has missed several chemo appointments due to his aggressive behavior. CT on admission with infiltrative metastatic disease in the surgical site soft tissues, and possibly liver mets. Discussed with HCPOA 8/27 who would like to proceed with comfort care measures - palliative care consulted, appreciate assistance with this patient Support system deficit Patient has no family support, and receives all his care from his medical care giver/POA [name]. She is having a difficult time with his behaviors.  Chronic and Stable Problems:  MCA CVA: stable, aspirin 81 and Plavix 75 daily Epilepsy: Depakote at 1000 mg daily, Keppra 1500 twice daily Tobacco use disorder: Smokes 2 to 4 packs/day  FEN/GI: regular PPx: lovenox Dispo:pending placement, pt is medically cleared  Subjective:  Patient frustrated this morning because he does not have a fork or knife for his breakfast, and his food is cold.  He wants to leave, states he will go to a hotel.  Objective: Temp:  [97.7 F (36.5 C)-97.8 F (36.6 C)] 97.7 F (36.5 C) (08/28 2000) Pulse Rate:  [82-85] 82 (08/28 2000) Resp:  [17] 17 (08/28 2000) BP: (97-109)/(56-62) 97/56 (08/28 2000) SpO2:  [99 %-100 %] 99 % (08/29 1478) Physical Exam: General: mildly agitated, no acute distress Cardiovascular: RRR, no murmurs Respiratory: CTAB,  no increased work of breathing Abdomen: no distension Extremities: no leg swelling bilaterally  Laboratory: Most recent CBC Lab Results  Component Value Date   WBC 6.4  01/14/2023   HGB 10.8 (L) 01/14/2023   HCT 33.2 (L) 01/14/2023   MCV 91.7 01/14/2023   PLT 108 (L) 01/14/2023   Most recent BMP    Latest Ref Rng & Units 01/15/2023    7:26 AM  BMP  Glucose 70 - 99 mg/dL 621   BUN 8 - 23 mg/dL 22   Creatinine 3.08 - 1.24 mg/dL 6.57   Sodium 846 - 962 mmol/L 137   Potassium 3.5 - 5.1 mmol/L 3.5   Chloride 98 - 111 mmol/L 106   CO2 22 - 32 mmol/L 22   Calcium 8.9 - 10.3 mg/dL 9.1    Imaging/Diagnostic Tests:  Para March, DO 01/17/2023, 11:51 AM  PGY-1, [facility] FPTS Intern pager: [phone], text pages welcome Secure chat group [facility] Teaching Service

## 2023-01-17 NOTE — Assessment & Plan Note (Addendum)
According to [facility] Kerry [name], patient had issues with aggression at his facility surrounding not being able to smoke. Kerry [name] reports patient having issues with staying at current ALF, thus she is looking through alternate facility through [place]. TOC has now taken over the search for placement as the [facility] said that they would not continue to look for placement given that he is hospitalized. -1:1 sitter -IVC in place by our team -Delirium precautions -Up with assistance -Nicotine lozenges PRN -Psychiatry was consulted for medication optimization, agree with his current management, signed off.  -out of bed with supervision order placed, pt wants to walk around -Zyprexa 10 mg daily -Zyprexa 5 mg 12h PRN for agitation -Ambien 2.5mg  for sleep

## 2023-01-17 NOTE — Progress Notes (Addendum)
Called to bedside by nursing staff for acute agitation. Per nursing report patient was attempting to leave the unit and was become profane and making threats to unit staff. Security present outside patient's room on arrival. Orders given for RN to draw up 5mg  Zyprexa to be given IM should patient require it.   Thankfully patient was redirectable. Dr. Sharion Dove showed him some card tricks. With security dismissed he calmed and ultimately requested something to "calm him down and help him sleep."  Zyprexa 5mg  given PO rather than IV.  Attended to patient comfort with cranberry juice, lights out, and several blankets.  At the time we left the room he was tucked into bed with the lights out and plans for sleep.   Sitter arrived to unit shortly after the events above.  - 1-to-1 sitter  - recommend redirection as first line intervention for agitation - Please page [place] at [phone] should agitation recur - Care plan reviewed with RN and sitter  - He requests some 2.5+ reading glasses so he can read the newspaper. Our team will try to coordinate with his POA [name] tomorrow to get him some.   Eliezer Mccoy, MD

## 2023-01-17 NOTE — Assessment & Plan Note (Addendum)
Patient with known history of dementia, and continues to have behavioral disturbances centered around smoking.  -Zyprexa 10 mg daily -Zyprexa 5 mg 12h PRN for agitation

## 2023-01-17 NOTE — Progress Notes (Signed)
Patient became very angry and aggressive this evening when told that we needed him to stay in his room and not "take a walk" alone on the unit. He verbally threatened staff and became more and more agitated when we attempted to redirect him. Security called to the unit; patient became even more upset. His covering medical team came to the unit and convinced him to take his PRN medication. Patient calmed down and stated that he was going to bed and did not want the sitter inside his room with him. The sitter is currently sitting at door of patients room, window blinds are open and patient is visible at all times. Will continue to monitor closely.

## 2023-01-17 NOTE — TOC Progression Note (Addendum)
Transition of Care [facility]) - Progression Note    Patient Details  Name: Kerry [name] MRN: [id number] Date of Birth: 12-Feb-1946  Transition of Care [facility]) CM/SW Contact  Carley Hammed, LCSW Phone Number: 01/17/2023, 1:18 PM  Clinical Narrative:     CSW spoke with [name], and reviewed current barriers to placement. CSW advised that out of the facilities the [place] has approved, the local ones have declined due to pt's behaviors. HCPOA asked about several local facilities and CSW educated that the facilities have to be in network with the [place], or the [place] will not pay. CSW explained that the remaining facilities are far, HCPOA concerned with this, but does note [place] is the preference if local is not an option. Referrals were sent to all facilities on the list that noted having a locked MC unit.   [place], [address] [facility]                     (Declined) [address] [place] [facility], [place] (Declined) [place], [place] [facility] [place], [place] [facility] [place], [place]   (Declined) [place], [place]      [place], [address] and [place] [facility], [place] [employer], [place], [place], [place] , [place]                (DECLINED) [facility], [place], [address] [place], [address] [employer], [place], [place] [facility] [place], [place] [facility], [place] [facility], [place]  CSW will update team and family with any acceptances or updates.  Expected Discharge Plan:  (TBD) Barriers to Discharge: Continued Medical Work up, English as a second language teacher, Other (must enter comment) Presenter, broadcasting)  Expected Discharge Plan and Services In-house Referral: Clinical Social Work, Hospice / Palliative Care   Post Acute Care Choice:  (TBD) Living arrangements for the past 2  months: Independent Living Facility                                       Social Determinants of Health (SDOH) Interventions SDOH Screenings   Food Insecurity: No Food Insecurity (01/14/2023)  Housing: Low Risk  (01/14/2023)  Transportation Needs: No Transportation Needs (01/14/2023)  Utilities: Not At Risk (01/14/2023)  Financial Resource Strain: Medium Risk (08/28/2022)   Received from [facility], [facility]  Social Connections: Unknown (10/02/2021)   Received from [facility], [facility]  Stress: Stress Concern Present (08/21/2022)   Received from [facility] [place], [facility]  Tobacco Use: High Risk (01/14/2023)    Readmission Risk Interventions     No data to display

## 2023-01-18 ENCOUNTER — Encounter (HOSPITAL_COMMUNITY): Payer: Self-pay | Admitting: Family Medicine

## 2023-01-18 DIAGNOSIS — F03918 Unspecified dementia, unspecified severity, with other behavioral disturbance: Secondary | ICD-10-CM

## 2023-01-18 DIAGNOSIS — F03B18 Unspecified dementia, moderate, with other behavioral disturbance: Secondary | ICD-10-CM | POA: Diagnosis not present

## 2023-01-18 DIAGNOSIS — G4700 Insomnia, unspecified: Secondary | ICD-10-CM | POA: Diagnosis not present

## 2023-01-18 NOTE — Plan of Care (Signed)

## 2023-01-18 NOTE — Progress Notes (Signed)
Daily Progress Note Intern Pager: [phone]  Patient name: Kerry [name] [facility] Medical record number: [id number] Date of birth: 04-26-1946 Age: 77 y.o. Gender: male  Primary Care Provider: Clinic, [facility] Consultants: psychiatry, palliative care  Code Status: DNR   Pt Overview and Major Events to Date:  8/26 - admitted 8/27 - IVC placed 8/29 - declined from 4 local facilities, in addition to all of the [facility] local facilities  Assessment and Plan:  77 year old male who initially presented with constipation and behavioral issues at his living facility.  Constipation resolved while here. Palliative care continuing to follow. TOC is following to try to find placement for patient, as the [facility] social worker said that they would not continue to find placement for him since he is hospitalized.  IVC in place.  Placement is our only barrier to discharge.   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  Assessment & Plan Moderate dementia with behavioral disturbance [facility]) According to [facility] [name], patient had issues with agitation at his facility surrounding not being able to smoke. [name] reports patient having issues with staying at current ALF, thus she is looking through alternate facility through [place]. TOC has now taken over the search for placement as the [facility] said that they would not continue to look for placement given that he is hospitalized. -1:1 sitter -IVC in place by our team -Delirium precautions -Up with assistance -Nicotine lozenges PRN -Zyprexa 10 mg daily -Zyprexa 5 mg 12h PRN for agitation -Ambien 2.5mg  for sleep added yesterday, tolerating well Pancreatic cancer [facility]) Patient has Stage III adenocarcinoma. Patient was diagnosed in 08/12/22, had Whipple procedure, and is now on chemotherapy. Has missed several chemo appointments due to his aggressive behavior. CT on admission with infiltrative metastatic disease in the  surgical site soft tissues, and possibly liver mets. Discussed with HCPOA 8/27 who would like to proceed with comfort care measures - palliative care consulted, appreciate assistance with this patient Support system deficit Patient has no family support, and receives all his care from his medical care giver/POA [name]. She is having a difficult time with his behaviors. 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 Barriers include finding facility for placement, pt is medically cleared.   Subjective:  No acute events overnight. Pt sleeping. Woke up when I came in to tell me he is sleeping. No complaints today. Had BM last night 8/29 per nurse.  Objective: Temp:  [97.6 F (36.4 C)-98 F (36.7 C)] 97.6 F (36.4 C) (08/30 0742) Pulse Rate:  [70-80] 73 (08/30 0742) Resp:  [16] 16 (08/30 0825) BP: (92-109)/(48-57) 92/49 (08/30 0742) SpO2:  [99 %-100 %] 100 % (08/30 0742) Physical Exam: General: no acute distress, resting comfortably Respiratory: no increased WOB Abdomen: non distended Extremities: no swelling BLE  Laboratory: Most recent CBC Lab Results  Component Value Date   WBC 6.4 01/14/2023   HGB 10.8 (L) 01/14/2023   HCT 33.2 (L) 01/14/2023   MCV 91.7 01/14/2023   PLT 108 (L) 01/14/2023   Most recent BMP    Latest Ref Rng & Units 01/15/2023    7:26 AM  BMP  Glucose 70 - 99 mg/dL 696   BUN 8 - 23 mg/dL 22  Creatinine 0.61 - 1.24 mg/dL 1.61   Sodium 096 - 045 mmol/L 137   Potassium 3.5 - 5.1 mmol/L 3.5   Chloride 98 - 111 mmol/L 106   CO2 22 - 32 mmol/L 22   Calcium 8.9 - 10.3 mg/dL 9.1    Imaging/Diagnostic Tests: None  Aloura Matsuoka, DO 01/18/2023, 11:49 AM  PGY-1, [place] [facility] FPTS Intern  pager: [phone], text pages welcome Secure chat group [facility] Teaching Service

## 2023-01-18 NOTE — Assessment & Plan Note (Deleted)
According to [facility] [name], patient had issues with aggression at his facility surrounding not being able to smoke. [name] reports patient having issues with staying at current ALF, thus she is looking through alternate facility through [place]. TOC has now taken over the search for placement as the [facility] said that they would not continue to look for placement given that he is hospitalized. -1:1 sitter -IVC in place by our team -Delirium precautions -Up with assistance -Nicotine lozenges PRN -Psychiatry was consulted for medication optimization, agree with his current management, signed off.  -out of bed with supervision order placed, pt wants to walk around -Zyprexa 10 mg daily -Zyprexa 5 mg 12h PRN for agitation -Ambien 2.5mg  for sleep added yesterday, tolerating well

## 2023-01-18 NOTE — Assessment & Plan Note (Addendum)
Patient has no family support, and receives all his care from his medical care giver/POA [name]. She is having a difficult time with his behaviors. -Palliative care following, appreciate recs

## 2023-01-18 NOTE — Assessment & Plan Note (Signed)
 Patient has Stage III adenocarcinoma. Patient was diagnosed in 08/12/22, had Whipple procedure, and is now on chemotherapy. Has missed several chemo appointments due to his aggressive behavior. CT on admission with infiltrative metastatic disease in the surgical site soft tissues, and possibly liver mets. Discussed with HCPOA 8/27 who would like to proceed with comfort care measures - palliative care consulted, appreciate assistance with this patient

## 2023-01-18 NOTE — Progress Notes (Signed)
   01/18/23 1449  Mobility  Activity Ambulated with assistance in hallway  Level of Assistance Contact guard assist, steadying assist  Assistive Device None  Distance Ambulated (ft) 620 ft  Activity Response Tolerated well  Mobility Referral Yes  $Mobility charge 1 Mobility  Mobility Specialist Start Time (ACUTE ONLY) 1430  Mobility Specialist Stop Time (ACUTE ONLY) 1447  Mobility Specialist Time Calculation (min) (ACUTE ONLY) 17 min   Mobility Specialist: Progress Note Pt agreeable to mobility session - received standing in hallway with sitter. Required CG d/t unsteady gait. Pt took two seated breaks ( < 3 min ). Pt returned to bed with all needs met - call bell within reach. Sitter present.   Barnie Mort, BS Mobility Specialist Please contact via SecureChat or Rehab office at [phone].

## 2023-01-18 NOTE — TOC Progression Note (Signed)
Transition of Care [facility]) - Progression Note    Patient Details  Name: Kerry [name] MRN: [id number] Date of Birth: 1946/01/18  Transition of Care [facility]) CM/SW Contact  Carley Hammed, LCSW Phone Number: 01/18/2023, 12:46 PM  Clinical Narrative:    CSW met with APS SW [place] [phone]) and discussed barriers to placement, she will continue to follow pt at this time. Pt's IVC expires on Mon. Sept. 2. Team aware and coverage to update IVC. No offers have been made for this pt at this time. TOC will continue to follow for DC needs.    Expected Discharge Plan:  (TBD) Barriers to Discharge: Continued Medical Work up, English as a second language teacher, Other (must enter comment) Presenter, broadcasting)  Expected Discharge Plan and Services In-house Referral: Clinical Social Work, Hospice / Palliative Care   Post Acute Care Choice:  (TBD) Living arrangements for the past 2 months: Independent Living Facility                                       Social Determinants of Health (SDOH) Interventions SDOH Screenings   Food Insecurity: No Food Insecurity (01/14/2023)  Housing: Low Risk  (01/14/2023)  Transportation Needs: No Transportation Needs (01/14/2023)  Utilities: Not At Risk (01/14/2023)  Financial Resource Strain: Medium Risk (08/28/2022)   [facility]  Social Connections: Unknown (10/02/2021)   Received from [facility], [facility]  Stress: Stress Concern Present (08/21/2022)   Received from [facility], [facility]  Tobacco Use: High Risk (01/14/2023)    Readmission Risk Interventions     No data to display

## 2023-01-18 NOTE — Assessment & Plan Note (Addendum)
According to [facility] Kerry [name], patient had issues with agitation at his facility surrounding not being able to smoke. Kerry [name] reports patient having issues with staying at current ALF, thus she is looking through alternate facility through [place]. TOC has now taken over the search for placement as the [facility] said that they would not continue to look for placement given that he is hospitalized. -1:1 sitter -IVC in place by our team -Delirium precautions -Up with assistance -Nicotine lozenges PRN -Zyprexa 10 mg daily -Zyprexa 5 mg 12h PRN for agitation -Ambien 2.5mg  for sleep added yesterday, tolerating well

## 2023-01-19 DIAGNOSIS — F03B18 Unspecified dementia, moderate, with other behavioral disturbance: Secondary | ICD-10-CM | POA: Diagnosis not present

## 2023-01-19 DIAGNOSIS — G4700 Insomnia, unspecified: Secondary | ICD-10-CM | POA: Diagnosis not present

## 2023-01-19 DIAGNOSIS — F03918 Unspecified dementia, unspecified severity, with other behavioral disturbance: Secondary | ICD-10-CM | POA: Diagnosis not present

## 2023-01-19 NOTE — Progress Notes (Addendum)
     Daily Progress Note Intern Pager: [phone]  Patient name: Kerry [name] Medical record number: [id number] Date of birth: 09/06/1945 Age: 77 y.o. Gender: male  Primary Care Provider: [facility] Consultants: Psychiatry, Palliative  Code Status: DNR   Pt Overview and Major Events to Date:  8/26- admitted 8/27- IVC  Assessment and Plan: Kerry [name] is a 77yo male who initially presented with abdominal pain, constipation, and behavioral issues at his ILF. Found to have likely recurrence of his pancreatic cancer on CT imaging. Decision made by his HCPOA to not pursue aggressive work up or therapy for this, instead focusing on comfort and dignity. He is now medically stable awaiting placement.   Assessment & Plan Moderate dementia with behavioral disturbance (HCC) Placement has been a challenge due to behavioral disturbance. However, I will note that he has not demonstrated problem behaviors in >48 hours on current medication regimen. Perhaps this opens him up to different placement options?  -TOC assisting with placement.  -1:1 sitter -IVC in place by our team -Delirium precautions -Up with assistance -Nicotine lozenges PRN -Zyprexa 10 mg daily -Zyprexa 5 mg 12h PRN for agitation -Ambien 2.5mg  at bedtime  Pancreatic cancer [facility]) Patient has Stage III adenocarcinoma. Patient was diagnosed in 08/12/22, had Whipple procedure, and is now on chemotherapy. Has missed several chemo appointments due to his aggressive behavior. CT on admission with infiltrative metastatic disease in the surgical site soft tissues, and possibly liver mets. Discussed with HCPOA 8/27 who would like to proceed with comfort care measures - Not pursuing further workup or intervention, instead focusing on comfort and dignity.     FEN/GI: Regular PPx: Will D/c Lovenox today given desire to focus on comfort. He's also ambulatory while here Dispo: Pending placement per TOC    Subjective:   Awake during rounds ~5am. Tells me he slept poorly. Attributes this to the bed. No other complaints. Ready to leave. Expresses thanks for the reading glasses our pharmacy team was able to procure for him.   Objective: Temp:  [97.3 F (36.3 C)-98.4 F (36.9 C)] 98.4 F (36.9 C) (08/31 0432) Pulse Rate:  [72-83] 74 (08/31 0432) Resp:  [16] 16 (08/31 0432) BP: (92-114)/(49-61) 103/61 (08/31 0432) SpO2:  [100 %] 100 % (08/31 0432) Physical Exam: General: Elderly. NAD, generally in good spirits Cardiovascular: Regular rate and rhythm without murmur Respiratory: Normal WOB on RA Abdomen: Non-tender and non-distended Extremities: Without edema or deformity   Per Dr. Marisue Humble  Laboratory: Most recent CBC Lab Results  Component Value Date   WBC 6.4 01/14/2023   HGB 10.8 (L) 01/14/2023   HCT 33.2 (L) 01/14/2023   MCV 91.7 01/14/2023   PLT 108 (L) 01/14/2023   Most recent BMP    Latest Ref Rng & Units 01/15/2023    7:26 AM  BMP  Glucose 70 - 99 mg/dL 578   BUN 8 - 23 mg/dL 22   Creatinine 4.69 - 1.24 mg/dL 6.29   Sodium 528 - 413 mmol/L 137   Potassium 3.5 - 5.1 mmol/L 3.5   Chloride 98 - 111 mmol/L 106   CO2 22 - 32 mmol/L 22   Calcium 8.9 - 10.3 mg/dL 9.1     Imaging/Diagnostic Tests: No new imaging, tests  Alicia Amel, MD 01/19/2023, 4:35 AM  PGY-3, [place] [facility] FPTS Intern pager: [phone], text pages welcome Secure chat group [name] Teaching Service

## 2023-01-19 NOTE — Assessment & Plan Note (Signed)
Placement has been a challenge due to behavioral disturbance. However, I will note that he has not demonstrated problem behaviors in >48 hours on current medication regimen. Perhaps this opens him up to different placement options?  -TOC assisting with placement.  -1:1 sitter -IVC in place by our team -Delirium precautions -Up with assistance -Nicotine lozenges PRN -Zyprexa 10 mg daily -Zyprexa 5 mg 12h PRN for agitation -Ambien 2.5mg  at bedtime

## 2023-01-19 NOTE — Plan of Care (Signed)

## 2023-01-19 NOTE — Progress Notes (Signed)
The patient is sleeping this morning. No acute findings on his physical exam. She is stable for SNF discharge.

## 2023-01-19 NOTE — Assessment & Plan Note (Signed)
Patient has Stage III adenocarcinoma. Patient was diagnosed in 08/12/22, had Whipple procedure, and is now on chemotherapy. Has missed several chemo appointments due to his aggressive behavior. CT on admission with infiltrative metastatic disease in the surgical site soft tissues, and possibly liver mets. Discussed with HCPOA 8/27 who would like to proceed with comfort care measures - Not pursuing further workup or intervention, instead focusing on comfort and dignity.

## 2023-01-19 NOTE — Plan of Care (Signed)
  Problem: Education: Goal: Knowledge of General Education information will improve Description: Including pain rating scale, medication(s)/side effects and non-pharmacologic comfort measures Outcome: Progressing   Problem: Clinical Measurements: Goal: Ability to maintain clinical measurements within normal limits will improve Outcome: Progressing Goal: Diagnostic test results will improve Outcome: Progressing Goal: Cardiovascular complication will be avoided Outcome: Progressing   

## 2023-01-20 DIAGNOSIS — G4700 Insomnia, unspecified: Secondary | ICD-10-CM | POA: Diagnosis not present

## 2023-01-20 DIAGNOSIS — F03B18 Unspecified dementia, moderate, with other behavioral disturbance: Secondary | ICD-10-CM | POA: Diagnosis not present

## 2023-01-20 LAB — COMPREHENSIVE METABOLIC PANEL
ALT: 19 U/L (ref 0–44)
AST: 22 U/L (ref 15–41)
Albumin: 2.6 g/dL — ABNORMAL LOW (ref 3.5–5.0)
Alkaline Phosphatase: 58 U/L (ref 38–126)
Anion gap: 13 (ref 5–15)
BUN: 14 mg/dL (ref 8–23)
CO2: 22 mmol/L (ref 22–32)
Calcium: 8.4 mg/dL — ABNORMAL LOW (ref 8.9–10.3)
Chloride: 103 mmol/L (ref 98–111)
Creatinine, Ser: 0.71 mg/dL (ref 0.61–1.24)
GFR, Estimated: >60 mL/min (ref >60)
Glucose, Bld: 97 mg/dL (ref 70–99)
Potassium: 3.1 mmol/L — ABNORMAL LOW (ref 3.5–5.1)
Sodium: 138 mmol/L (ref 135–145)
Total Bilirubin: 0.5 mg/dL (ref 0.3–1.2)
Total Protein: 5.7 g/dL — ABNORMAL LOW (ref 6.5–8.1)

## 2023-01-20 LAB — CBC
HCT: 26.1 % — ABNORMAL LOW (ref 39.0–52.0)
Hemoglobin: 8.9 g/dL — ABNORMAL LOW (ref 13.0–17.0)
MCH: 30.7 pg (ref 26.0–34.0)
MCHC: 34.1 g/dL (ref 30.0–36.0)
MCV: 90 fL (ref 80.0–100.0)
Platelets: 75 10*3/uL — ABNORMAL LOW (ref 150–400)
RBC: 2.9 MIL/uL — ABNORMAL LOW (ref 4.22–5.81)
RDW: 13.6 % (ref 11.5–15.5)
WBC: 3.5 10*3/uL — ABNORMAL LOW (ref 4.0–10.5)
nRBC: 0 % (ref 0.0–0.2)

## 2023-01-20 MED ORDER — OLANZAPINE 5 MG PO TABS
10.0000 mg | ORAL_TABLET | Freq: Once | ORAL | Status: AC
  Administered 2023-01-20: 10 mg via ORAL
  Filled 2023-01-20: qty 2

## 2023-01-20 MED ORDER — POTASSIUM CHLORIDE CRYS ER 20 MEQ PO TBCR
40.0000 meq | EXTENDED_RELEASE_TABLET | ORAL | Status: AC
  Administered 2023-01-20 (×2): 40 meq via ORAL
  Filled 2023-01-20 (×2): qty 2

## 2023-01-20 NOTE — Assessment & Plan Note (Signed)
Placement has been a challenge due to behavioral disturbance. However, Patient has not demonstrated problem behaviors in >72 hours on current medication regimen. Perhaps this opens him up to different placement options?  -TOC assisting with placement.  -1:1 sitter -IVC in place by our team -Delirium precautions -Up with assistance -Nicotine lozenges PRN -Zyprexa 10 mg daily -Zyprexa 5 mg 12h PRN for agitation -Ambien 2.5mg  at bedtime

## 2023-01-20 NOTE — Progress Notes (Signed)
Daily Progress Note Intern Pager: [phone]  Patient name: Kerry [name] [facility] Medical record number: [id number] Date of birth: [birth year] Age: 77 y.o. Gender: male  Primary Care Provider: Clinic, [facility] Consultants: Psychiatry, Palliative  Code Status: DNR  Pt Overview and Major Events to Date:  8/26- admitted 8/27- IVC  Assessment and Plan:  Kerry [name] is a 77yo male who initially presented with abdominal pain, constipation, and behavioral issues at his ILF. Found to have likely recurrence of his pancreatic cancer on CT imaging. Decision made by his HCPOA to not pursue aggressive work up or therapy for this, instead focusing on comfort and dignity. He is now medically stable awaiting placement.   -      Hospital     * (Principal) Moderate dementia with behavioral disturbance (HCC)     Placement has been a challenge due to behavioral disturbance. However,  Patient has not demonstrated problem behaviors in >72 hours on current  medication regimen. Perhaps this opens him up to different placement  options?  -TOC assisting with placement.  -1:1 sitter -IVC in place by our team -Delirium precautions -Up with assistance -Nicotine lozenges PRN -Zyprexa 10 mg daily -Zyprexa 5 mg 12h PRN for agitation -Ambien 2.5mg  at bedtime         Support system deficit     Patient has no family support, and receives all his care from his  medical care giver/POA [name]. She is having a difficult time with  his behaviors. -Palliative care following, appreciate recs        Pancreatic cancer [facility])     Patient has Stage III adenocarcinoma. Patient was diagnosed in 08/12/22,  had Whipple procedure, and is now on chemotherapy. Has missed several  chemo appointments due to his aggressive behavior. CT on admission with  infiltrative metastatic disease in the surgical site soft tissues, and  possibly liver mets. Discussed with HCPOA 8/27 who would like to proceed  with  comfort care measures - Not pursuing further workup or intervention, instead focusing on comfort  and dignity.        FEN/GI: Regular PPx: Patient is ambulatory, w/ focus on comfort, no ppx at this time Dispo: Pending placement    Subjective:  Doing well this morning, but reports he needs more meds to go to sleep.   Objective: Temp:  [98.3 F (36.8 C)-99.2 F (37.3 C)] 99.2 F (37.3 C) (08/31 2143) Pulse Rate:  [71-86] 78 (08/31 2143) Resp:  [15-20] 20 (08/31 2143) BP: (94-117)/(54-73) 109/66 (08/31 2143) SpO2:  [100 %] 100 % (08/31 2143) Physical Exam: General: Elderly, frail, pleasant, NAD Cardiovascular: RRR, S1/S2, NRMG Respiratory: CTABL Abdomen: Soft, NTTP, non-distended Extremities: Moving all extremities, no edema  Laboratory: Most recent CBC Lab Results  Component Value Date   WBC 6.4 01/14/2023   HGB 10.8 (L) 01/14/2023   HCT 33.2 (L) 01/14/2023   MCV 91.7 01/14/2023   PLT 108 (L) 01/14/2023   Most recent BMP    Latest Ref Rng & Units 01/15/2023    7:26 AM  BMP  Glucose 70 - 99 mg/dL 147   BUN 8 - 23 mg/dL 22   Creatinine 8.29 - 1.24 mg/dL 5.62   Sodium 130 - 865 mmol/L 137   Potassium 3.5 - 5.1 mmol/L 3.5   Chloride 98 - 111 mmol/L 106   CO2 22 - 32 mmol/L 22   Calcium 8.9 - 10.3 mg/dL 9.1     Bess Kinds, MD 01/20/2023, 3:49 AM  PGY-3, [place] [facility] FPTS Intern pager: [phone], text pages welcome Secure chat group [facility] Teaching Service

## 2023-01-20 NOTE — Plan of Care (Signed)

## 2023-01-20 NOTE — Assessment & Plan Note (Signed)
Patient has Stage III adenocarcinoma. Patient was diagnosed in 08/12/22, had Whipple procedure, and is now on chemotherapy. Has missed several chemo appointments due to his aggressive behavior. CT on admission with infiltrative metastatic disease in the surgical site soft tissues, and possibly liver mets. Discussed with HCPOA 8/27 who would like to proceed with comfort care measures - Not pursuing further workup or intervention, instead focusing on comfort and dignity.

## 2023-01-21 DIAGNOSIS — G4700 Insomnia, unspecified: Secondary | ICD-10-CM | POA: Diagnosis not present

## 2023-01-21 DIAGNOSIS — F03B18 Unspecified dementia, moderate, with other behavioral disturbance: Secondary | ICD-10-CM | POA: Diagnosis not present

## 2023-01-21 LAB — BASIC METABOLIC PANEL
Anion gap: 6 (ref 5–15)
BUN: 11 mg/dL (ref 8–23)
CO2: 23 mmol/L (ref 22–32)
Calcium: 8.7 mg/dL — ABNORMAL LOW (ref 8.9–10.3)
Chloride: 107 mmol/L (ref 98–111)
Creatinine, Ser: 0.75 mg/dL (ref 0.61–1.24)
GFR, Estimated: >60 mL/min (ref >60)
Glucose, Bld: 85 mg/dL (ref 70–99)
Potassium: 3.8 mmol/L (ref 3.5–5.1)
Sodium: 136 mmol/L (ref 135–145)

## 2023-01-21 LAB — CBC
HCT: 28.4 % — ABNORMAL LOW (ref 39.0–52.0)
Hemoglobin: 9.1 g/dL — ABNORMAL LOW (ref 13.0–17.0)
MCH: 29.1 pg (ref 26.0–34.0)
MCHC: 32 g/dL (ref 30.0–36.0)
MCV: 90.7 fL (ref 80.0–100.0)
Platelets: 79 10*3/uL — ABNORMAL LOW (ref 150–400)
RBC: 3.13 MIL/uL — ABNORMAL LOW (ref 4.22–5.81)
RDW: 13.7 % (ref 11.5–15.5)
WBC: 3.4 10*3/uL — ABNORMAL LOW (ref 4.0–10.5)
nRBC: 0 % (ref 0.0–0.2)

## 2023-01-21 NOTE — Progress Notes (Addendum)
Pt became irritable when this nurse open the bathroom door to make sure pt was safe. He started cussing and calling this nurse outside her name. Pt was uncooperative by not allowing the nurse to scan arm bractlet to give medication. Pt is dangling on the side of the bed, taking his pills at this time. He wants to speak with the MD, I explain to the pt that MD has already rounded on him. 1:1 sitter in place and plan of care ongoing.

## 2023-01-21 NOTE — Assessment & Plan Note (Deleted)
Patient has no family support, and receives all his care from his medical care giver/POA [name]. She is having a difficult time with his behaviors. -Palliative care following, appreciate recs

## 2023-01-21 NOTE — Progress Notes (Signed)
   01/20/23 2035  Assess: MEWS Score  Temp 98.2 F (36.8 C)  BP (!) 96/59  MAP (mmHg) 70  Pulse Rate 73  Resp (!) 24  SpO2 99 %  O2 Device Room Air  Assess: MEWS Score  MEWS Temp 0  MEWS Systolic 1  MEWS Pulse 0  MEWS RR 1  MEWS LOC 0  MEWS Score 2  MEWS Score Color Yellow  Assess: if the MEWS score is Yellow or Red  Were vital signs accurate and taken at a resting state? No, vital signs rechecked  Does the patient meet 2 or more of the SIRS criteria? Yes  Does the patient have a confirmed or suspected source of infection? No  MEWS guidelines implemented  Yes, yellow  Treat  MEWS Interventions Considered administering scheduled or prn medications/treatments as ordered  Take Vital Signs  Increase Vital Sign Frequency  Yellow: Q2hr x1, continue Q4hrs until patient remains green for 12hrs  Escalate  MEWS: Escalate Yellow: Discuss with charge nurse and consider notifying provider and/or RRT  Notify: Charge Nurse/RN  Name of Charge Nurse/RN Notified Economist  Provider Notification  Provider Name/Title MD Rainey Pines  Date Provider Notified 01/20/23  Time Provider Notified 2330  Method of Notification Page  Notification Reason Other (Comment) (yellow mews)  Provider response No new orders  Date of Provider Response 01/20/23  Time of Provider Response 2335  Assess: SIRS CRITERIA  SIRS Temperature  0  SIRS Pulse 0  SIRS Respirations  1  SIRS WBC 1  SIRS Score Sum  2

## 2023-01-21 NOTE — Progress Notes (Signed)
     Daily Progress Note Intern Pager: [phone]  Patient name: Kerry [name] [facility] Medical record number: [id number] Date of birth: 1946/03/27 Age: 77 y.o. Gender: male  Primary Care Provider: Clinic, [facility] Consultants: psychiatry, palliative, TOC Code Status: DNR  Pt/ Overview and Major Events to Date:  8/26 - admitted 8/27 - IVC initial 9/2 - renew IVC  Assessment and Plan:  77yo male who initially presented with abdominal pain, constipation, and behavioral issues at his ILF. Found to have likely recurrence of his pancreatic cancer on CT imaging. Decision made by his HCPOA to not pursue aggressive work up or therapy for this, instead focusing on comfort and dignity. He is now medically stable awaiting placement. Pt has not had any behavioral issues in over 4 days on his current medication regimen.  Assessment & Plan Moderate dementia with behavioral disturbance (HCC) Placement has been a challenge due to behavioral disturbance. However, Patient has not demonstrated problem behaviors in >4 days on current medication regimen. Perhaps this opens him up to different placement options?  -TOC assisting with placement.  -1:1 sitter -IVC in place by our team -Delirium precautions -Up with assistance -Nicotine lozenges PRN -Zyprexa 10 mg daily -Zyprexa 5 mg 12h PRN for agitation -Ambien 2.5mg  at bedtime  - CMP checked 9/1 due to olanzapine - K 3.1 initially, repleted to 3.8 Pancreatic [facility]) Patient has Stage III adenocarcinoma. Patient was diagnosed in 08/12/22, had Whipple procedure, and is now on chemotherapy. Has missed several chemo appointments due to his aggressive behavior. CT on admission with infiltrative metastatic disease in the surgical site soft tissues, and possibly liver mets. Discussed with HCPOA 8/27 who would like to proceed with comfort care measures - Not pursuing further workup or intervention, instead focusing on comfort and dignity.   Chronic and  Stable Problems: none   FEN/GI: regular PPx: none, ambulatory, d/c lovenox on 8/31 given desire to focus on comfort Dispo:Pending placement per TOC  Subjective:  Sleeping comfortably, says he is trying to get sleep because he did not sleep well last night  Objective: Temp:  [97.8 F (36.6 C)-98.4 F (36.9 C)] 98.3 F (36.8 C) (09/02 0806) Pulse Rate:  [70-75] 75 (09/02 0806) Resp:  [18-24] 18 (09/02 0806) BP: (96-112)/(59-87) 105/87 (09/02 0806) SpO2:  [99 %-100 %] 100 % (09/02 0806) Physical Exam: General: No acute distress resting comfortably Respiratory: No increased work of breathing Abdomen: Soft nontender Extremities: No leg swelling, moves extremities spontaneously  Laboratory: Most recent CBC Lab Results  Component Value Date   WBC 3.4 (L) 01/21/2023   HGB 9.1 (L) 01/21/2023   HCT 28.4 (L) 01/21/2023   MCV 90.7 01/21/2023   PLT 79 (L) 01/21/2023   Most recent BMP    Latest Ref Rng & Units 01/21/2023    2:08 AM  BMP  Glucose 70 - 99 mg/dL 85   BUN 8 - 23 mg/dL 11   Creatinine 4.40 - 1.24 mg/dL 1.02   Sodium 725 - 366 mmol/L 136   Potassium 3.5 - 5.1 mmol/L 3.8   Chloride 98 - 111 mmol/L 107   CO2 22 - 32 mmol/L 23   Calcium 8.9 - 10.3 mg/dL 8.7    Imaging/Diagnostic Tests: none  Lola Czerwonka, DO 01/21/2023, 11:54 AM  PGY-1, [place] [facility] FPTS Intern pager: [phone], text pages welcome Secure chat group [facility] Teaching Service

## 2023-01-21 NOTE — TOC Progression Note (Addendum)
Transition of Care [facility]) - Progression Note    Patient Details  Name: Kerry [name] MRN: [id number] Date of Birth: [birth year]  Transition of Care [facility]) CM/SW Contact  Lorri Frederick, LCSW Phone Number: 01/21/2023, 2:25 PM  Clinical Narrative:   IVC paperwork renewed envelope #[id number], case [phone].  Paperwork placed in drawer outside pt room, [employer] called for service of the custody order.     Expected Discharge Plan:  (TBD) Barriers to Discharge: Continued Medical Work up, English as a second language teacher, Other (must enter comment) Presenter, broadcasting)  Expected Discharge Plan and Services In-house Referral: Clinical Social Work, Hospice / Palliative Care   Post Acute Care Choice:  (TBD) Living arrangements for the past 2 months: Independent Living Facility                                       Social Determinants of Health (SDOH) Interventions SDOH Screenings   Food Insecurity: No Food Insecurity (01/14/2023)  Housing: Low Risk  (01/14/2023)  Transportation Needs: No Transportation Needs (01/14/2023)  Utilities: Not At Risk (01/14/2023)  Financial Resource Strain: Medium Risk (08/28/2022)   Received from [facility], [facility]  Social Connections: Unknown (10/02/2021)   Received from [facility], [facility]  Stress: Stress Concern Present (08/21/2022)   [facility] from [facility], [facility]  Tobacco Use: High Risk (01/14/2023)    Readmission Risk Interventions     No data to display

## 2023-01-21 NOTE — Plan of Care (Signed)
  Problem: Health Behavior/Discharge Planning: Goal: Ability to manage health-related needs will improve Outcome: Progressing   Problem: Clinical Measurements: Goal: Will remain free from infection Outcome: Progressing Goal: Cardiovascular complication will be avoided Outcome: Progressing   Problem: Nutrition: Goal: Adequate nutrition will be maintained Outcome: Progressing   

## 2023-01-21 NOTE — Progress Notes (Signed)
Pt is resting comfortable in bed. He states " he tried, did not sleep last night". Pt has been cooperative since the incident earlier, pleasant with sitter. Darral Dash, pt spoke with her briefly.

## 2023-01-22 DIAGNOSIS — G4700 Insomnia, unspecified: Secondary | ICD-10-CM | POA: Diagnosis not present

## 2023-01-22 DIAGNOSIS — F03B18 Unspecified dementia, moderate, with other behavioral disturbance: Secondary | ICD-10-CM | POA: Diagnosis not present

## 2023-01-22 DIAGNOSIS — F03918 Unspecified dementia, unspecified severity, with other behavioral disturbance: Secondary | ICD-10-CM | POA: Diagnosis not present

## 2023-01-22 NOTE — Plan of Care (Signed)

## 2023-01-22 NOTE — Assessment & Plan Note (Addendum)
Placement has been a challenge due to behavioral disturbance, however has been relatively stable on his current medication regimen.  -TOC assisting with placement.  -1:1 sitter -IVC in place by our team -Delirium precautions -Up with assistance -Nicotine lozenges PRN -Zyprexa 10 mg daily -Zyprexa 5 mg 12h PRN for agitation -Ambien 2.5mg  at bedtime  -CMP checked 9/1 due to olanzapine, electrolytes repleted as needed

## 2023-01-22 NOTE — TOC Progression Note (Signed)
Transition of Care [facility]) - Progression Note    Patient Details  Name: Kerry [name] MRN: [id number] Date of Birth: August 26, 1945  Transition of Care [facility]) CM/SW Contact  Carley Hammed, LCSW Phone Number: 01/22/2023, 1:23 PM  Clinical Narrative:    CSW advised by several facilities on the list they are no longer contracted with the [place]. [facility] sent over several other lists and those Secure unit options have been added to the list. TOC will continue to search for an appropriate in network bed.  [place], [place]                     (Declined) [place] neuro, [place] (Declined) [place], [place]    VM [facility]HC [place], [place]   VM Full [facility] [place], [place]   (Declined) [place], Pensions consultant [place]     VM [place] in [place], [place] and [place]                      VM [facility] [place], [place]  VM [place]                 Re-sent referral [employer], [place]      VM full [place], [place]               VM [place], [place]  VM [place], [place]                (DECLINED) [facility], [place]               Re send referral next week [place], [address]            No answer [facility], [address]        ([place], NIN)   [employer], [employer] Resend referral [employer], [place]                reviewing ACC [place], [place]                 VM [facility], [place]              VM [employer], [place]               VM   Additional facilities identified [place], [place] [address], [place], [place] MTN [place] [employer], [employer] Company  CMS [employer], [place]  [place], [place] [employer], [place], [place] MTN [place] at [employer], [employer] MD [place] @ [place] MD [place]  [place].   Expected Discharge Plan:   (TBD) Barriers to Discharge: Continued Medical Work up, English as a second language teacher, Other (must enter comment) Presenter, broadcasting)  Expected Discharge Plan and Services In-house Referral: Clinical Social Work, Hospice / Palliative Care   Post Acute Care Choice:  (TBD) Living arrangements for the past 2 months: Independent Living Facility                                       Social Determinants of Health (SDOH) Interventions SDOH Screenings   Food Insecurity: No Food Insecurity (01/14/2023)  Housing: Low Risk  (01/14/2023)  Transportation Needs: No Transportation Needs (01/14/2023)  Utilities: Not At Risk (01/14/2023)  Financial Resource Strain:  Medium Risk (08/28/2022)   [facility], [facility]  Social Connections: Unknown (10/02/2021)   Received from [facility], [facility]  Stress: Stress Concern Present (08/21/2022)   Received from [facility], [facility]  Tobacco Use: High Risk (01/14/2023)    Readmission Risk Interventions     No data to display

## 2023-01-22 NOTE — Assessment & Plan Note (Signed)
Patient has Stage III adenocarcinoma. Patient was diagnosed in 08/12/22, had Whipple procedure, and is now on chemotherapy. Has missed several chemo appointments due to his aggressive behavior. CT on admission with infiltrative metastatic disease in the surgical site soft tissues, and possibly liver mets. Discussed with HCPOA 8/27 who would like to proceed with comfort care measures - Not pursuing further workup or intervention, instead focusing on comfort and dignity.

## 2023-01-22 NOTE — Progress Notes (Signed)
   01/22/23 1556  Mobility  Activity Ambulated independently in hallway  Level of Assistance Standby assist, set-up cues, supervision of patient - no hands on  Assistive Device None  Distance Ambulated (ft) 550 ft  Activity Response Tolerated well  Mobility Referral Yes  $Mobility charge 1 Mobility  Mobility Specialist Start Time (ACUTE ONLY) 1529  Mobility Specialist Stop Time (ACUTE ONLY) 1549  Mobility Specialist Time Calculation (min) (ACUTE ONLY) 20 min   Mobility Specialist: Progress Note  Pt agreeable to mobility session - received in bed. Required SV throughout denied use of RW and had no complaints. Pt returned to EOB with all needs met - call bell within reach. Sitter present outside of room.   Barnie Mort, BS Mobility Specialist Please contact via SecureChat or Rehab office at [phone].

## 2023-01-22 NOTE — Plan of Care (Signed)

## 2023-01-22 NOTE — Progress Notes (Signed)
     Daily Progress Note Intern Pager: [phone]  Patient name: Kerry [name] [facility] Medical record number: [id number] Date of birth: 1946-03-01 Age: 77 y.o. Gender: male  Primary Care Provider: Clinic, [facility] Consultants: psychiatry, palliative, TOC Code Status: DNR  Pt Overview and Major Events to Date:  8/26 - admitted 8/27 - IVC initial 9/2 - renewed IVC  Assessment and Plan:  77yo male who initially presented with abdominal pain, constipation, and behavioral issues at his ILF. Found to have likely recurrence of his pancreatic cancer on CT imaging. Decision made by his HCPOA to not pursue aggressive work up or therapy for this, instead focusing on comfort and dignity. He is now medically stable awaiting placement.  Assessment & Plan Moderate dementia with behavioral disturbance (HCC) Placement has been a challenge due to behavioral disturbance, however has been relatively stable on his current medication regimen.  -TOC assisting with placement.  -1:1 sitter -IVC in place by our team -Delirium precautions -Up with assistance -Nicotine lozenges PRN -Zyprexa 10 mg daily -Zyprexa 5 mg 12h PRN for agitation -Ambien 2.5mg  at bedtime  -CMP checked 9/1 due to olanzapine, electrolytes repleted as needed Pancreatic cancer [place]) Patient has Stage III adenocarcinoma. Patient was diagnosed in 08/12/22, had Whipple procedure, and is now on chemotherapy. Has missed several chemo appointments due to his aggressive behavior. CT on admission with infiltrative metastatic disease in the surgical site soft tissues, and possibly liver mets. Discussed with HCPOA 8/27 who would like to proceed with comfort care measures - Not pursuing further workup or intervention, instead focusing on comfort and dignity.   Chronic and Stable Problems: none  FEN/GI: regular PPx: none, ambulatory, d/c lovenox on 8/31 given desire to focus on comfort Dispo: Pending placement per TOC  Subjective:  Doing  well, states that he slept last night with the ambien but wants to go back to sleep  Objective: Temp:  [98 F (36.7 C)-98.5 F (36.9 C)] 98 F (36.7 C) (09/03 0645) Pulse Rate:  [73-86] 73 (09/03 0645) Resp:  [16-18] 16 (09/03 0645) BP: (88-114)/(43-67) 108/55 (09/03 0645) SpO2:  [99 %-100 %] 99 % (09/03 0803) Physical Exam: General: No acute distress, just ate breakfast Cardiovascular: RRR, no murmurs Respiratory: CTAB, no increased work of breathing Abdomen: non distended Extremities: no leg swelling, moves all extremities spontaneously   Laboratory: Most recent CBC Lab Results  Component Value Date   WBC 3.4 (L) 01/21/2023   HGB 9.1 (L) 01/21/2023   HCT 28.4 (L) 01/21/2023   MCV 90.7 01/21/2023   PLT 79 (L) 01/21/2023   Most recent BMP    Latest Ref Rng & Units 01/21/2023    2:08 AM  BMP  Glucose 70 - 99 mg/dL 85   BUN 8 - 23 mg/dL 11   Creatinine 3.66 - 1.24 mg/dL 4.40   Sodium 347 - 425 mmol/L 136   Potassium 3.5 - 5.1 mmol/L 3.8   Chloride 98 - 111 mmol/L 107   CO2 22 - 32 mmol/L 23   Calcium 8.9 - 10.3 mg/dL 8.7     Imaging/Diagnostic Tests: None  Jimmy Stipes, DO 01/22/2023, 11:03 AM  PGY-1, [place] [facility] FPTS Intern pager: [phone], text pages welcome Secure chat group [facility] Teaching Service

## 2023-01-23 DIAGNOSIS — F03B18 Unspecified dementia, moderate, with other behavioral disturbance: Secondary | ICD-10-CM | POA: Diagnosis not present

## 2023-01-23 DIAGNOSIS — G4700 Insomnia, unspecified: Secondary | ICD-10-CM | POA: Diagnosis not present

## 2023-01-23 DIAGNOSIS — F03918 Unspecified dementia, unspecified severity, with other behavioral disturbance: Secondary | ICD-10-CM | POA: Diagnosis not present

## 2023-01-23 NOTE — Progress Notes (Signed)
     Daily Progress Note Intern Pager: [phone]  Patient name: Kerry [name] [facility] Medical record number: [id number] Date of birth: November 28, 1945 Age: 77 y.o. Gender: male  Primary Care Provider: Clinic, [facility] Consultants: psychiatry, palliative, TOC Code Status: DNR  Pt Overview and Major Events to Date:  8/26 - admitted 8/27 - IVC initial 9/2 - renewed IVC  Assessment and Plan:  77yo male who initially presented with abdominal pain, constipation, and behavioral issues at his ILF. Found to have likely recurrence of his pancreatic cancer on CT imaging. Decision made by his HCPOA to not pursue aggressive work up or therapy for this, instead focusing on comfort and dignity. He is now medically stable awaiting placement.  Assessment & Plan Moderate dementia with behavioral disturbance (HCC) Placement has been a challenge due to behavioral disturbance, however has been relatively stable on his current medication regimen.  -TOC assisting with placement.  -1:1 sitter -IVC in place by our team -Delirium precautions -Up with assistance -Nicotine lozenges PRN - pharmacy has ordered for more to be sent -Zyprexa 10 mg daily -Zyprexa 5 mg 12h PRN for agitation -Ambien 2.5mg  at bedtime +/- melatonin -CMP checked 9/1 due to olanzapine, electrolytes repleted as needed Pancreatic cancer [facility]) Patient has Stage III adenocarcinoma. Patient was diagnosed in 08/12/22, had Whipple procedure, and is now on chemotherapy. Has missed several chemo appointments due to his aggressive behavior. CT on admission with infiltrative metastatic disease in the surgical site soft tissues, and possibly liver mets. Discussed with HCPOA 8/27 who would like to proceed with comfort care measures - Not pursuing further workup or intervention, instead focusing on comfort and dignity.   Chronic and Stable Problems: none  FEN/GI: regular PPx: none, ambulatory, d/c lovenox on 8/31 given desire to focus on  comfort Dispo: Pending placement per TOC  Subjective:  Patient sleeping on arrival.  States that he is tired of people waking him up from sleep.  Does note that he is out of his nicotine lozenges.  Objective: Temp:  [97.8 F (36.6 C)] 97.8 F (36.6 C) (09/03 1952) Pulse Rate:  [78] 78 (09/03 1952) Resp:  [16] 16 (09/03 1952) BP: (111)/(61) 111/61 (09/03 1952) SpO2:  [100 %] 100 % (09/03 1952) Physical Exam: General: No acute distress, sleeping Cardiovascular: Regular rate and rhythm, no murmurs Respiratory: Clear to auscultation bilaterally, no increased work of breathing Extremities: Moves extremity spontaneously  Laboratory: Most recent CBC Lab Results  Component Value Date   WBC 3.4 (L) 01/21/2023   HGB 9.1 (L) 01/21/2023   HCT 28.4 (L) 01/21/2023   MCV 90.7 01/21/2023   PLT 79 (L) 01/21/2023   Most recent BMP    Latest Ref Rng & Units 01/21/2023    2:08 AM  BMP  Glucose 70 - 99 mg/dL 85   BUN 8 - 23 mg/dL 11   Creatinine 6.29 - 1.24 mg/dL 5.28   Sodium 413 - 244 mmol/L 136   Potassium 3.5 - 5.1 mmol/L 3.8   Chloride 98 - 111 mmol/L 107   CO2 22 - 32 mmol/L 23   Calcium 8.9 - 10.3 mg/dL 8.7    Imaging/Diagnostic Tests: None  Etana Beets, DO 01/23/2023, 8:40 AM  PGY-1, [facility] FPTS Intern pager: [phone], text pages welcome Secure chat group [facility] Teaching Service

## 2023-01-23 NOTE — TOC Progression Note (Addendum)
Transition of Care [facility]) - Progression Note    Patient Details  Name: Kerry [name] MRN: [id number] Date of Birth: 1946-02-19  Transition [facility]) CM/SW Contact  Carley Hammed, LCSW Phone Number: 01/23/2023, 12:59 PM  Clinical Narrative:    CSW was notified that pt is stating he does not want his HCPOA, [name], involved with his healthcare. CSW spoke with [name] who noted pt and PCP put the [facility] in place and pt had requested [name] be named. Pt's brother does not speak to him, and [name] states concern that the brother just wants his money, and the pt needs that to care for himself. Pt states he has a daughter, but [name] advises they have tried to find her, and a daughter cannot be located. CSW left a VM for [place] with APS to confirm.   Additional referrals sent to locked units as far out as [place]. CSW asked if pt has ever had Medicaid. [name] says no, CSW asked for that to be considered as an alternate payor source as the [place] coverage is proving difficult to find a placement. CSW to consult financial counseling. TOC will continue to follow for DC needs.   2:30 CSW spoke with [place] who noted they would review pt and consider applying for guardianship. Pt will need a capacity eval prior to the decision. Given pt stating he does not want [name] involved, APS will speak with her about if she wants to be pt's guardian. TOC will continue to follow for DC needs.   Expected Discharge Plan:  (TBD) Barriers to Discharge: Continued Medical Work up, English as a second language teacher, Other (must enter comment) Presenter, broadcasting)  Expected Discharge Plan and Services In-house Referral: Clinical Social Work, Hospice / Palliative Care   Post Acute Care Choice:  (TBD) Living arrangements for the past 2 months: Independent Living Facility                                       Social Determinants of Health (SDOH) Interventions SDOH Screenings   Food Insecurity: No Food Insecurity (01/14/2023)  Housing:  Low Risk  (01/14/2023)  Transportation Needs: No Transportation Needs (01/14/2023)  Utilities: Not At Risk (01/14/2023)  Financial Resource Strain: Medium Risk (08/28/2022)   Received from [facility], [facility]  Social Connections: Unknown (10/02/2021)   Received from [facility], [facility]  Stress: Stress Concern Present (08/21/2022)   Received from [facility], [facility]  Tobacco Use: High Risk (01/14/2023)    Readmission Risk Interventions     No data to display

## 2023-01-23 NOTE — Assessment & Plan Note (Addendum)
Placement has been a challenge due to behavioral disturbance, however has been relatively stable on his current medication regimen.  -TOC assisting with placement.  -1:1 sitter -IVC in place by our team -Delirium precautions -Up with assistance -Nicotine lozenges PRN - pharmacy has ordered for more to be sent -Zyprexa 10 mg daily -Zyprexa 5 mg 12h PRN for agitation -Ambien 2.5mg  at bedtime +/- melatonin -CMP checked 9/1 due to olanzapine, electrolytes repleted as needed

## 2023-01-23 NOTE — Assessment & Plan Note (Signed)
Patient has Stage III adenocarcinoma. Patient was diagnosed in 08/12/22, had Whipple procedure, and is now on chemotherapy. Has missed several chemo appointments due to his aggressive behavior. CT on admission with infiltrative metastatic disease in the surgical site soft tissues, and possibly liver mets. Discussed with HCPOA 8/27 who would like to proceed with comfort care measures - Not pursuing further workup or intervention, instead focusing on comfort and dignity.

## 2023-01-24 DIAGNOSIS — F03B18 Unspecified dementia, moderate, with other behavioral disturbance: Secondary | ICD-10-CM | POA: Diagnosis not present

## 2023-01-24 MED ORDER — ACETAMINOPHEN 325 MG PO TABS: 650.0000 mg | ORAL_TABLET | Freq: Four times a day (QID) | ORAL | Status: AC | PRN

## 2023-01-24 NOTE — Progress Notes (Signed)
Pt left bag of belongings in room- a belt and cell phone. Left at nursing station with pt name.

## 2023-01-24 NOTE — Consult Note (Signed)
[name] Psychiatry Consult Note   Service Date: January 24, 2023 LOS:  LOS: 10 days  MRN: [id number] Type of consult:  New   Primary Psychiatric Diagnoses  None  Assessment  Kerry [name] is a 77 y.o. male with no past psychiatric history and significant PMHx of moderate dementia with behavioral disturbance and pancreatic cancer . Psychiatry was consulted for "capacity" by Kerry Quale, MD.   On my evaluation at 1:15 pm, after only having had one interaction with the patient, he was alert and fully oriented to self, day of month/month/year, location, and situation. He was able to perform attention test (reciting sequential months of the year in reverse order) flawlessly.  The four component model of decisional capacity in the healthcare setting outlined in an article by Kerry [name] and Kerry [name] (https://www.nejm.org/doi/full/10.1056/nejmcp074045) is the gold standard for assessing patients' capacity to make decisions related to their medical care  Communication of a choice. Patient is able clearly and consistently express the choice of changing his durable power of attorney. The patient has met this criteria. Demonstrate understanding of the relevant information. Patient is able to phrase in his own words the proposed decision, stating "she (the current DPOA) has not been treating me well. I don't want a power of attorney anymore and I want to make my own decisions." The patient has met  this criteria. Appreciation of patient's own situation and its consequences ("insight"). 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 The patient has met  this criteria. Reason through relevant information. Patient is able to reason through and provide justification  for his selection of option/s. The patient has met this criteria.  At my time of evaluation, based on my evaluation of the above criteria, for this particular snapshot of time, the patient has met the full standard for medical decision-making capacity.  I deferred patient's question about discharging home to his primary care team.  Diagnoses:  Active Hospital problems: Principal Problem:   Moderate dementia with behavioral disturbance (HCC) Active Problems:   Support system deficit   Pancreatic cancer (HCC)    Plan and Recommendations  ## Interventions (medications, psychoeducation, etc):  -- none  ## Further Work-up:  -- none  -- most recent EKG on 01/10/2023 had QtC of 427 -- Pertinent labwork reviewed earlier this admission includes: CBC, CMP, urine toxicology  ## Medical Decision Making Capacity:  -  (see above)  ## Disposition:  -- No indication for inpatient psychiatric admission. No indication for outpatient psychiatry / psychology follow-up. Defer immediate disposition plan to primary team  ## Behavioral / Environmental:  -- Standard delirium precautions and Fall precautions  ##Legal Status -- Patient voluntary  Thank you for this consult request. Our recommendations are listed above.  We will sign-off at this time  ## Safety and Observation Level:  - Based on my clinical evaluation, I estimate the patient to be at low risk of self harm in the current setting - At this time, we recommend a no additional level of observation. This decision is based on my review of the chart including patient's history and current presentation, interview of the patient, mental status examination, and consideration of suicide risk including evaluating suicidal ideation, plan, intent, suicidal or self-harm  behaviors, risk factors, and protective factors. This judgment is based on our ability to directly address suicide risk, implement suicide prevention strategies and develop a safety plan  while the patient is in the clinical setting. Please contact our team if there is a concern that risk level has changed.  Augusto Gamble, MD  History Obtained on Initial Interview  Relevant Aspects of Hospital Course:  Admitted on 01/14/2023 for progression of pancreatic cancer.  Patient Report:  Patient says he would like to change his DPOA because "she (the current DPOA) has not been treating me well. I don't want a power of attorney anymore and I want to make my own decisions." He explains that he would like to do some philanthropic work ("I know I don't have long to live, but I used to help people like the homeless and kids and I can still help them before I go"). He is also wanting to leave, raising his voice to me, because "I got a notification that $2,700 was used on an Amex card under my name, and I don't even have an Amex card so I have to take care of that."  I told him I do not decide when he can discharge but that I would let his primary team know. He apologized for raising his voice.  Psychiatric and Social History   Psychiatric History  Information collected from patient and available chart review  Patient has no psychiatric history Family psych history: not obtained  Social History:  Lived in [place], Kerry [name] is POA  Substance Use History: Not explored during encounter   Other Histories  These are pulled from EMR and updated if appropriate.   Family History:  The patient's family history is not on file.  Medical History: Past Medical History:  Diagnosis Date   Aggressive behavior 11/22/2021   anxiety    Anxiety    Bipolar 1 disorder (HCC)    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   Constipation 01/14/2023   Dementia (HCC)    Seizures (HCC)    Sleep apnea     Surgical History: Past Surgical History:  Procedure Laterality Date   BILIARY BRUSHING  10/23/2021   Procedure: BILIARY BRUSHING;  Surgeon: Lemar Lofty., MD;  Location: [facility]  ENDOSCOPY;  Service: Gastroenterology;;   BILIARY STENT PLACEMENT  10/23/2021   Procedure: BILIARY STENT PLACEMENT;  Surgeon: Lemar Lofty., MD;  Location[facility] ENDOSCOPY;  Service: Gastroenterology;;   BIOPSY  10/23/2021   Procedure: BIOPSY;  Surgeon: Lemar Lofty., MD;  Location: [facility] ENDOSCOPY;  Service: Gastroenterology;;   ERCP N/A 10/23/2021   Procedure: ENDOSCOPIC RETROGRADE CHOLANGIOPANCREATOGRAPHY (ERCP);  Surgeon: Lemar Lofty., MD;  Location: [facility] ENDOSCOPY;  Service: Gastroenterology;  Laterality: N/A;   ESOPHAGOGASTRODUODENOSCOPY (EGD) WITH PROPOFOL N/A 10/23/2021   Procedure: ESOPHAGOGASTRODUODENOSCOPY (EGD) WITH PROPOFOL;  Surgeon: Meridee Score Netty Starring., MD;  Location: [facility] ENDOSCOPY;  Service: Gastroenterology;  Laterality: N/A;   FINE NEEDLE ASPIRATION  10/23/2021   Procedure: FINE NEEDLE ASPIRATION (FNA) LINEAR;  Surgeon: Lemar Lofty., MD;  Location: [facility] ENDOSCOPY;  Service: Gastroenterology;;   REMOVAL OF STONES  10/23/2021   Procedure: REMOVAL OF SLUDGE;  Surgeon: Lemar Lofty., MD;  Location: [facility] ENDOSCOPY;  Service: Gastroenterology;;   [name]  10/23/2021   Procedure: [name];  Surgeon: Lemar Lofty., MD;  Location: [facility] ENDOSCOPY;  Service: Gastroenterology;;   UPPER ESOPHAGEAL ENDOSCOPIC ULTRASOUND (EUS) N/A 10/23/2021   Procedure: UPPER ESOPHAGEAL ENDOSCOPIC ULTRASOUND (EUS);  Surgeon: Meridee Score,  Netty Starring., MD;  Location: [facility] ENDOSCOPY;  Service: Gastroenterology;  Laterality: N/A;    Medications:   Current Facility-Administered Medications:    acetaminophen (TYLENOL) tablet 650 mg, 650 mg, Oral, Q6H PRN, Hindel, Leah, MD, 650 mg at 01/23/23 0549   ascorbic acid (VITAMIN C) tablet 500 mg, 500 mg, Oral, Daily, Sowell, Brandon, MD, 500 mg at 01/24/23 1610   aspirin EC tablet 81 mg, 81 mg, Oral, Daily, Sowell, Brandon, MD, 81 mg at 01/24/23 0935   cholecalciferol (VITAMIN D3) 25 MCG (1000 UNIT) tablet 2,000 Units,  2,000 Units, Oral, QHS, Sowell, Brandon, MD, 2,000 Units at 01/23/23 2224   clopidogrel (PLAVIX) tablet 75 mg, 75 mg, Oral, Daily, Everhart, Kirstie, DO, 75 mg at 01/24/23 0935   divalproex (DEPAKOTE ER) 24 hr tablet 1,000 mg, 1,000 mg, Oral, QHS, Sowell, Brandon, MD, 1,000 mg at 01/23/23 2236   donepezil (ARICEPT) tablet 5 mg, 5 mg, Oral, q morning, Sowell, Brandon, MD, 5 mg at 01/24/23 0934   feeding supplement (ENSURE ENLIVE / ENSURE PLUS) liquid 237 mL, 237 mL, Oral, TID BM, Sowell, Brandon, MD, 237 mL at 01/24/23 0932   finasteride (PROSCAR) tablet 5 mg, 5 mg, Oral, Daily, Sowell, Brandon, MD, 5 mg at 01/24/23 0934   ibuprofen (ADVIL) tablet 800 mg, 800 mg, Oral, Q8H PRN, Hindel, Leah, MD   levETIRAcetam (KEPPRA) tablet 1,500 mg, 1,500 mg, Oral, BID, Sowell, Brandon, MD, 1,500 mg at 01/24/23 0934   lidocaine (LIDODERM) 5 % 1 patch, 1 patch, Transdermal, Q24H, Lockie Mola, MD, 1 patch at 01/23/23 2226   melatonin tablet 5 mg, 5 mg, Oral, QHS, Darnelle Spangle B, MD, 5 mg at 01/23/23 2224   multivitamin with minerals tablet 1 tablet, 1 tablet, Oral, Daily, Sowell, Apolinar Junes, MD, 1 tablet at 01/24/23 0934   nicotine (NICODERM CQ - dosed in mg/24 hours) patch 21 mg, 21 mg, Transdermal, Daily, Sowell, Brandon, MD, 21 mg at 01/24/23 9604   nicotine polacrilex (COMMIT) lozenge 2 mg, 2 mg, Oral, PRN, Peterson Ao, MD   nicotine polacrilex (NICORETTE) gum 2 mg, 2 mg, Oral, PRN, Bess Kinds, MD   OLANZapine zydis (ZYPREXA) disintegrating tablet 5 mg, 5 mg, Oral, Q12H PRN, 5 mg at 01/24/23 0934 **OR** OLANZapine (ZYPREXA) injection 5 mg, 5 mg, Intramuscular, Q12H PRN, Peterson Ao, MD   OLANZapine (ZYPREXA) tablet 10 mg, 10 mg, Oral, QHS, Sowell, Brandon, MD, 10 mg at 01/23/23 2224   rosuvastatin (CRESTOR) tablet 5 mg, 5 mg, Oral, QHS, Sowell, Brandon, MD, 5 mg at 01/23/23 2224   umeclidinium bromide (INCRUSE ELLIPTA) 62.5 MCG/ACT 1 puff, 1 puff, Inhalation, Daily, Sowell, Apolinar Junes, MD, 1 puff at  01/24/23 0844   zolpidem (AMBIEN) tablet 2.5 mg, 2.5 mg, Oral, QHS, Peterson Ao, MD, 2.5 mg at 01/23/23 2224  Allergies: Allergies  Allergen Reactions   Fluoride Other (See Comments)    Seizures and stroke after dental visit from using fluoride paste (per pt)     Exam Findings  Vital signs:  Temp:  [97.9 F (36.6 C)-98 F (36.7 C)] 98 F (36.7 C) (09/05 0826) Pulse Rate:  [72-75] 72 (09/05 0844) Resp:  [15-19] 16 (09/05 0844) BP: (101-112)/(58-64) 105/64 (09/05 0826) SpO2:  [99 %-100 %] 99 % (09/05 0844)  Psychiatric Specialty Exam:  Presentation  General Appearance: Appropriate for Environment; Casual  Eye Contact:Fleeting  Speech:Clear and Coherent; Normal Rate  Speech Volume:Increased  Handedness:Right   Mood and Affect  Mood:-- ("I wanna leave")  Affect:Congruent; Appropriate; Restricted (irritable)   Thought Process  Thought Processes:Linear; Coherent  Descriptions of Associations:Intact  Orientation:Full (Time, Place and Person)  Thought Content:Logical; WDL  History of Schizophrenia/Schizoaffective disorder:No  Duration of Psychotic Symptoms:No data recorded Hallucinations:Hallucinations: None  Ideas of Reference:None  Suicidal Thoughts:Suicidal Thoughts: No  Homicidal Thoughts:Homicidal Thoughts: No   Sensorium  Memory:Immediate Good; Recent Good  Judgment:Poor  Insight:Lacking   Executive Functions  Concentration:Good  Attention Span:Good  Recall:Fair  Fund of Knowledge:Fair  Language:Good   Psychomotor Activity  Psychomotor Activity:Psychomotor Activity: Normal   Assets  Assets:Resilience   Sleep  Sleep:Sleep: -- (not assessed)   Physical Exam: Physical Exam Vitals and nursing note reviewed.  HENT:     Head: Normocephalic and atraumatic.     Comments: I noted patient to be somewhat hard of hearing Pulmonary:     Effort: Pulmonary effort is normal.  Musculoskeletal:     Cervical back: Normal range of  motion.  Neurological:     General: No focal deficit present.     Mental Status: He is alert. Mental status is at baseline.     Blood pressure 105/64, pulse 72, temperature 98 F (36.7 C), temperature source Oral, resp. rate 16, height 5\' 6"  (1.676 m), weight 63.5 kg, SpO2 99%. Body mass index is 22.6 kg/m.

## 2023-01-24 NOTE — TOC Progression Note (Signed)
Transition of Care [facility]) - Progression Note    Patient Details  Name: Kerry [name] MRN: [id number] Date of Birth: [birth year]  Transition of [facility]) CM/SW Contact  Carley Hammed, LCSW Phone Number: 01/24/2023, 12:35 PM  Clinical Narrative:     CSW was advised that pt's is now exhibiting capacity and team would like for him to be evaluated. APS notes they can send a clinician to do a formal capacity eval. CSW advised the team of a third party coming to complete the eval. CSW spoke with the [place] about alternate options for living situations that can be pursued instead of SNF.     Psych met with pt and ethics committee was consulted. IVC rescinded and pt has decided to leave AMA. CSW left a VM for APS and the [place]. CSW spoke with [name] who cannot come get him as he has no where to go and "won't act right". CSW spoke with [place] who noted pt is not allowed to return there.  CSW met with pt who was advised of all of this. He asked that CSW contact [place] and ask if Jeb Levering can come get him. CE states they do not have that person on staff. CSW advised pt of this, he states "they are lying, but it is fine, can I leave?" CSW advised that pt was able to leave but was also able to stay for treatment and to be somewhere safe. Pt states he is safe and cannot stand another night in the hospital. Pt states he will go outside and talk to someone to get a ride to the bus station. States he can call and get money from an account tomorrow.  CSW encouraged pt to come back through the ED if he needs to. Pt's sitter also present, encourages pt to stay as well.  CSW consulted TOC supervision and discussed situation with nursing staff.   Expected Discharge Plan:  (TBD) Barriers to Discharge: Continued Medical Work up, English as a second language teacher, Other (must enter comment) Presenter, broadcasting)  Expected Discharge Plan and Services In-house Referral: Clinical Social Work, Hospice /  Palliative Care   Post Acute Care Choice:  (TBD) Living arrangements for the past 2 months: Independent Living Facility                                       Social Determinants of Health (SDOH) Interventions SDOH Screenings   Food Insecurity: No Food Insecurity (01/14/2023)  Housing: Low Risk  (01/14/2023)  Transportation Needs: No Transportation Needs (01/14/2023)  Utilities: Not At Risk (01/14/2023)  Financial Resource Strain: Medium Risk (08/28/2022)   Received from [facility], [facility]  Social Connections: Unknown (10/02/2021)   [facility] from [facility], [facility]  Stress: Stress Concern Present (08/21/2022)   [facility], [facility]  Tobacco Use: High Risk (01/14/2023)    Readmission Risk Interventions     No data to display

## 2023-01-24 NOTE — Ethics Note (Signed)
Ethics Consult Note  Initial contact w/ medical team 01/24/23, which was on patient's hospital day 10   Source of Consult:  Current attending physician/service: Latrelle Dodrill, MD  Reason(s) for consult / relevant ethical question(s): Concern for medical decision-making / capacity    Information-gathering: Discussion with source of consult - Para March DO (resident physician)  Narrative:  Medical situation:  Description of case from person requesting consult: Pt initially delirious/agitated and history of behavioral problems, but now appears to be improved and is stating he wants to pursue cancer treatment and he does not want any continued involvement from person who has heretofore served as his Education officer, environmental.  Patient's decision-making capacity: appears to be intact per medical team, in process of confirming w/ psychiatry         Recommendations / Ethical Analysis:  Assuming patient has capacity to give informed consent / refusal for treatment, he has the right to make his own autonomous decision. If patient has capacity, his wishes re: all medical treatments as well as who he designates as surrogate healthcare decision-makers / HCPOA should be respected.  It is advised he name a HCPOA on an advanced directive if he desires, or give consent for attending physician to make medical decisions if he becomes unable - strongly encourage filling out advanced directive.  Psychiatry evaluation is not always absolutely necessary to determine capacity, but their input can be helpful if/when mental illness (history of illness, or active illness) may affect decision-making. Any physician can determine capacity but is also encouraged to seek consultation whenever capacity is in serious question. Psychiatry evaluation is also appropriate if psychiatry initiated an IVC and needs to reevaluate to continue or to discontinue that IVC.  Related, if this patient is asking to receive cancer  treatments for which he is not an appropriate candidate (requesting/demanding medically futile treatment), this should be addressed with oncology team, and plan to reengage ethics as needed.      Relevant underlying ethical principles were discussed directly with Dr Rexene Alberts. Other resources were discussed directly with them, if needed. Ethics committee strives to ensure that all necessary and appropriate steps are taken by the medical team such that all decisions made for this patient are ethically appropriate. Please note that Ethics committee members will NOT offer legal or medical counsel.      Thank you for this consult. Ethics will continue to follow this case.   Dr Rexene Alberts has my personal cell phone number and has my permission to share this number at their discretion.  Secure message on Epic is also welcome but may not receive an immediate response.  Please reference AMION for on-call committee member if needed.    Sunnie Nielsen [facility] Ethics Committee

## 2023-01-24 NOTE — Progress Notes (Signed)
Psychiatry spoke to patient, DC'd IVC, and told pt he could leave. Pt grabbed his belongings and walked off the unit prior to signing AMA. MD notified.

## 2023-01-24 NOTE — Progress Notes (Signed)
   01/24/23 1530  Mobility  Activity Ambulated independently in hallway  Level of Assistance Independent  Assistive Device None  Distance Ambulated (ft) 400 ft  Activity Response Tolerated well  Mobility Referral Yes  $Mobility charge 1 Mobility  Mobility Specialist Start Time (ACUTE ONLY) 1509  Mobility Specialist Stop Time (ACUTE ONLY) 1514  Mobility Specialist Time Calculation (min) (ACUTE ONLY) 5 min   Mobility Specialist: Progress Note  Pt agreeable to mobility session - received standing in hallway with sitter present. Pt has no complaints. Pt left AMA.   Barnie Mort, BS Mobility Specialist Please contact via SecureChat or Rehab office at [phone].

## 2023-01-24 NOTE — Progress Notes (Signed)
Daily Progress Note Intern Pager: [phone]  Patient name: Kerry [name] [facility] Medical record number: [id number] Date of birth: 1945/09/24 Age: 77 y.o. Gender: male  Primary Care Provider: [facility] Consultants: psychiatry, palliative, TOC Code Status: DNR  Pt Overview and Major Events to Date:  8/26 - admitted 8/27 - IVC initial 9/2 - renewed IVC  Assessment and Plan:  77yo male who initially presented with abdominal pain, constipation, and behavioral issues at his ILF. Found to have likely recurrence of his pancreatic cancer on CT imaging. Decision made by his HCPOA to not pursue aggressive work up or therapy for this, instead focusing on comfort and dignity. He is now medically stable awaiting placement.   Upon patient evaluation this morning, he is now exhibiting capacity to make his own medical decisions.  He is able to tell me details of his cancer diagnosis and that he would like treatment.  States that he signed papers given by his caregiver/HCPOA and he did not know what they were about a month ago.  States that he has known her for 6 months.  He states that he does not want her involved in his care at this time.  States that he is able to make his own medical decisions, and he would like to be treated for his cancer here for transferred to a different hospital to get treatment.  I have spoken with TOC who recommended patient have a formal capacity evaluation.  Patient has an active APS case currently.  TOC will reach out to APS for further guidance regarding this matter. I have also consulted psychiatry to assist in evaluating for capacity given the legality and severity of the situation.  Have also placed a consult to the ethics committee. Assessment & Plan Moderate dementia with behavioral disturbance (HCC) Placement has been a challenge due to behavioral disturbance, however has been stable on his current medication regimen.  -TOC assisting with placement.   -TOC to look into APS case -Psych consult for assistance with this patient, appreciate recs -Ethics committee consult placed, appreciate recs -1:1 sitter -IVC in place by our team -Delirium precautions -Up with assistance -Nicotine lozenges PRN - pharmacy has ordered for more to be sent -Zyprexa 10 mg daily -Zyprexa 5 mg 12h PRN for agitation -Ambien 2.5mg  at bedtime +/- melatonin -CMP checked 9/1 due to olanzapine, electrolytes repleted as needed Pancreatic cancer [facility]) Patient has Stage III adenocarcinoma. Patient was diagnosed in 08/12/22, had Whipple procedure, and is now on chemotherapy. Has missed several chemo appointments due to his aggressive behavior. CT on admission with infiltrative metastatic disease in the surgical site soft tissues, and possibly liver mets. Discussed with HCPOA 8/27 who would like to proceed with comfort care measures - Not pursuing further workup or intervention, instead focusing on comfort and dignity per HCPOA   Chronic and Stable Problems: none   FEN/GI: regular PPx: none, ambulatory, d/c lovenox on 8/31 given desire to focus on comfort Dispo: Pending placement per TOC  Subjective:  No acute events overnight.  Patient notes abdominal pain today.  States he also wants to leave the hospital.   Pt states that he was given paperwork by his caregiver a couple months ago and did not know what he was signing.  He reports that she also wanted him to sign over his finances to her, and he did not want to do that.  States he does not want his caregiver to make his medical decisions and does not want  her involved in his care at this time because he wants to leave the hospital and be treated for his cancer.  He states that he knows he has pancreatic cancer that has recurred and spread to his liver, and wants to pursue treatment. He states that he was being treated outside the hospital for his cancer, reports that he missed appointments because his caregiver would not  take him.  He states that he has known this caregiver for about 6 months.  Objective: Temp:  [97.9 F (36.6 C)-98 F (36.7 C)] 98 F (36.7 C) (09/05 0826) Pulse Rate:  [72-75] 72 (09/05 0844) Resp:  [15-19] 16 (09/05 0844) BP: (101-112)/(58-64) 105/64 (09/05 0826) SpO2:  [99 %-100 %] 99 % (09/05 0844) Physical Exam: General: well appearing, no acute distress, resting in bed Cardiovascular: RRR, no murmurs Respiratory: CTAB, no increased work of breathing Abdomen: diffuse mild TTP, no distention Extremities: no swelling bilaterally  Laboratory: Most recent CBC Lab Results  Component Value Date   WBC 3.4 (L) 01/21/2023   HGB 9.1 (L) 01/21/2023   HCT 28.4 (L) 01/21/2023   MCV 90.7 01/21/2023   PLT 79 (L) 01/21/2023   Most recent BMP    Latest Ref Rng & Units 01/21/2023    2:08 AM  BMP  Glucose 70 - 99 mg/dL 85   BUN 8 - 23 mg/dL 11   Creatinine 1.61 - 1.24 mg/dL 0.96   Sodium 045 - 409 mmol/L 136   Potassium 3.5 - 5.1 mmol/L 3.8   Chloride 98 - 111 mmol/L 107   CO2 22 - 32 mmol/L 23   Calcium 8.9 - 10.3 mg/dL 8.7    Imaging/Diagnostic Tests: None  Jette Lewan, DO 01/24/2023, 10:44 AM  PGY-1, [facility] FPTS Intern pager: [phone], text pages welcome Secure chat group [facility] Teaching Service

## 2023-01-24 NOTE — Assessment & Plan Note (Addendum)
Patient has Stage III adenocarcinoma. Patient was diagnosed in 08/12/22, had Whipple procedure, and is now on chemotherapy. Has missed several chemo appointments due to his aggressive behavior. CT on admission with infiltrative metastatic disease in the surgical site soft tissues, and possibly liver mets. Discussed with HCPOA 8/27 who would like to proceed with comfort care measures - Not pursuing further workup or intervention, instead focusing on comfort and dignity per [facility]

## 2023-01-24 NOTE — Assessment & Plan Note (Addendum)
Placement has been a challenge due to behavioral disturbance, however has been stable on his current medication regimen.  -TOC assisting with placement.  -TOC to look into APS case -Psych consult for assistance with this patient, appreciate recs -Ethics committee consult placed, appreciate recs -1:1 sitter -IVC in place by our team -Delirium precautions -Up with assistance -Nicotine lozenges PRN - pharmacy has ordered for more to be sent -Zyprexa 10 mg daily -Zyprexa 5 mg 12h PRN for agitation -Ambien 2.5mg  at bedtime +/- melatonin -CMP checked 9/1 due to olanzapine, electrolytes repleted as needed

## 2023-01-24 NOTE — Discharge Summary (Addendum)
Family Medicine Teaching Service AMA Summary   Patient name: Kerry [name] [facility] Medical record number: [id number] Date of birth: Sep 30, 1945 Age: 77 y.o. Gender: male Date of Admission: 01/14/2023  Date of Discharge: 01/24/2023 Admitting Physician: Caro Laroche, DO  Primary Care Provider: [facility] Consultants: psychiatry, palliative, TOC  Code Status: DNR   Indication for Hospitalization:77 yo male who initially presented with Abdominal pain, constipation, and behavioral issues at his ILF. Found to have likely recurrence of his pancreatic cancer on CT imaging   Discharge Diagnoses/Problem List:  Principal Problem for Admission: Moderate dementia with behavioral disturbance (HCC)   Other Problems addressed during stay:  Principal Problem:   Moderate dementia with behavioral disturbance (HCC) Active Problems:   Support system deficit   Pancreatic cancer (HCC)  Pt Overview and Major Events to Date:  8/26 - Admitted 8/27 - IVC initial 9/2 - Renewed IVC 9/5 - Evaluated pt for capacity--had capacity, discontinued IVC, patient decided to leave [facility]   Brief Hospital Course:  Kerry [name] is a 77 y.o.male with a history of pancreatic cancer, moderate dementia with behavioral disturbance, MCA CVA, epilepsy, tobacco use disorder who was admitted to the [facility] Teaching Service at [facility] for constipation, IVC, and progression of pancreatic cancer. His hospital course is detailed below:  Dementia with behavioral disturbance During patient's first night here, he began demanding to leave the hospital because he could not smoke cigarettes here.  Notably, similar issues occurred with patient during his last hospital stay.  Nicotine lozenges were provided to patient on 8/27.  Psychiatry was consulted for medication optimization, agreed with regimen, and signed off.  Patient's behavior did improve after being provided with nicotine lozenges.  It also improved once he  started receiving Ambien nightly to help him sleep.   Initially, patient did not have capacity as he lacked insight into his care and required an IVC order.   Pancreatic cancer Patient's CT scan on admission concerning for pancreatic cancer with resection but extensive infiltrative soft tissue in the operative region concerning for recurrence, also with scattered lesions in the liver compatible with metastatic disease.    Decision was made by HPOA to make patient DNR and not continue cancer treatment given his inability to demonstrate capacity.  Of note, patient has previously missed several chemo treatments due to his behavior.  He was also not tolerating chemo well.  His hospital course was prolonged due to difficulty finding placement for patient.    Constipation 3-4 days of constipation. Improved with 1 dose of senna. Patient denied any further treatment after BM.   Social Concern: Patient was reassessed for medical capacity 9/5 and was found to be capable of medical decisions and meeting criteria for formal capacity.  After discussion with staff, it seemed that there was concern about his HCPOA as he reported that he was made to sign for HCPOA without knowing the document that he was signing.  He endorsed that he wanted to continue with oncology and chemotherapy for his pancreatic cancer.    Placed ethics and psychiatry consult, also agreed that he met capacity and was able to make medical decisions  Ultimately, the patient decided to leave AMA and no longer wanted to be under care at [facility].  He is high risk for readmission given his multiple comorbidities and what seems to be metastatic pancreatic cancer.  He does have safe disposition and reports that he has his HCPOA coming to pick him up to take him  to a hotel.  Patient understands all risks associated with AMA and was able to verbalize this prior to leaving.  He has an active APS report and TOC relayed information to APS during  this hospitalization.   Other chronic conditions were medically managed with home medications and formulary alternatives as necessary (Epilepsy, hx CVA, tobacco use disorder) MCA CVA - Stable, Taking ASA 81 and Plavix 75 mg Pancreatic Cancer - Stage III metastatic adenocarcinoma, Dx 08/09/22  - Curative treatment w/ abraxane/gemcitabine, has missed last 2-3 treatments Dementia - On Zyprexa 10 mg, noted to have aggressive behaviors, centered around wanting to smoke, will yell, cuss, and throw things Bipolar 1: Zyprexa 10 mg Epilepsy - Depakote 1000 mg daily, keppra 1500 BID, last seizure ~ 1 month Tobacco use disorder - Smokes 2-4 PPD, Nicotine patch / Nicotine gum  PCP Follow-up Recommendations: Follow-up with oncology Follow up with palliative Recommend PCP assess mentation & living area on next PCP visit    Disposition: Against medical advice - hotel with caretaker  Discharge Condition: Stable   Discharge Exam:  Vitals:   01/24/23 0826 01/24/23 0844  BP: 105/64   Pulse: 75 72  Resp: 15 16  Temp: 98 F (36.7 C)   SpO2: 100% 99%   General: well appearing, no acute distress, resting in bed Cardiovascular: RRR, no murmurs Respiratory: CTAB, no increased work of breathing Abdomen: diffuse mild TTP, no distention Extremities: no swelling bilaterally  Significant Procedures: none  Significant Labs and Imaging:  No results for input(s): "WBC", "HGB", "HCT", "PLT" in the last 48 hours. No results for input(s): "NA", "K", "CL", "CO2", "GLUCOSE", "BUN", "CREATININE", "CALCIUM", "MG", "PHOS", "ALKPHOS", "AST", "ALT", "ALBUMIN", "PROTEIN" in the last 48 hours.    Results/Tests Pending at Time of Discharge: none  Discharge Medications:  Allergies as of 01/24/2023       Reactions   Fluoride Other (See Comments)   Seizures and stroke after dental visit from using fluoride paste (per pt)        Medication List     TAKE these medications    acetaminophen 325 MG  tablet Commonly known as: TYLENOL Take 2 tablets (650 mg total) by mouth every 6 (six) hours as needed for mild pain.   ascorbic acid 500 MG tablet Commonly known as: VITAMIN C Take 500 mg by mouth daily.   aspirin EC 81 MG tablet Take 1 tablet (81 mg total) by mouth daily.   clopidogrel 75 MG tablet Commonly known as: PLAVIX Take 1 tablet (75 mg total) by mouth daily for 17 days.   divalproex 500 MG 24 hr tablet Commonly known as: DEPAKOTE ER Take 2 tablets (1,000 mg total) by mouth at bedtime.   donepezil 5 MG tablet Commonly known as: ARICEPT Take 1 tablet (5 mg total) by mouth every morning.   feeding supplement Liqd Take 237 mLs by mouth 3 (three) times daily between meals.   finasteride 5 MG tablet Commonly known as: PROSCAR Take 5 mg by mouth daily. For overactive bladder   levETIRAcetam 750 MG tablet Commonly known as: KEPPRA Take 2 tablets (1,500 mg total) by mouth 2 (two) times daily.   multivitamin with minerals Tabs tablet Take 1 tablet by mouth daily.   nicotine 21 mg/24hr patch Commonly known as: NICODERM CQ - dosed in mg/24 hours Place 1 patch (21 mg total) onto the skin daily.   nicotine polacrilex 2 MG gum Commonly known as: NICORETTE Take 1 each (2 mg total) by mouth as needed  for smoking cessation.   OLANZapine zydis 5 MG disintegrating tablet Commonly known as: ZYPREXA Take 1 tablet (5 mg total) by mouth every 8 (eight) hours as needed (agitation).   OLANZapine 10 MG tablet Commonly known as: ZYPREXA Take 1 tablet (10 mg total) by mouth at bedtime.   rosuvastatin 10 MG tablet Commonly known as: CRESTOR Take 0.5 tablets (5 mg total) by mouth at bedtime. For cholesterol   umeclidinium bromide 62.5 MCG/ACT Aepb Commonly known as: INCRUSE ELLIPTA Inhale 1 puff into the lungs daily.   Vitamin D 50 MCG (2000 UT) tablet Take 2,000 Units by mouth at bedtime.        Discharge Instructions: Please refer to Patient Instructions section of  EMR for full details.  Patient was counseled important signs and symptoms that should prompt return to medical care, changes in medications, dietary instructions, activity restrictions, and follow up appointments.   Follow-Up Appointments: Follow up with PCP & Onc   Hassan Buckler, PGY1 01/24/2023, 3:14 PM PGY-1, [facility]  Upper Level Addendum:  I agree with Dr. Weston Settle and reviewed the above note, making necessary revisions as appropriate.  I agree with the medical decision making and physical exam as noted above.  Alfredo Martinez, MD PGY-3 [facility] Family Medicine Residency

## 2023-03-30 IMAGING — CT CT MAXILLOFACIAL W/O CM
3 series · 15 of 47 positions shown, 18 images · non-contrast
Comparison: Head CT dated 10/30/2021.

CLINICAL DATA: Trauma.



[Series 4: facialbone 2.0 ax st · axial · 0.32mm/px · z∈[-158,-14]mm · 9 of 85 slices shown, 12 images]
[im 6/85  brain]
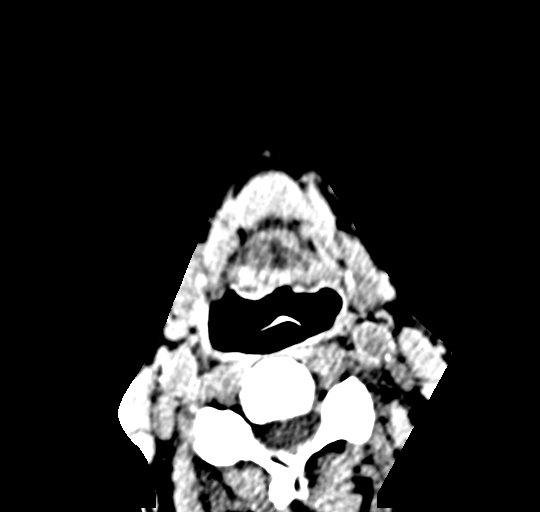
[im 6/85  bone]
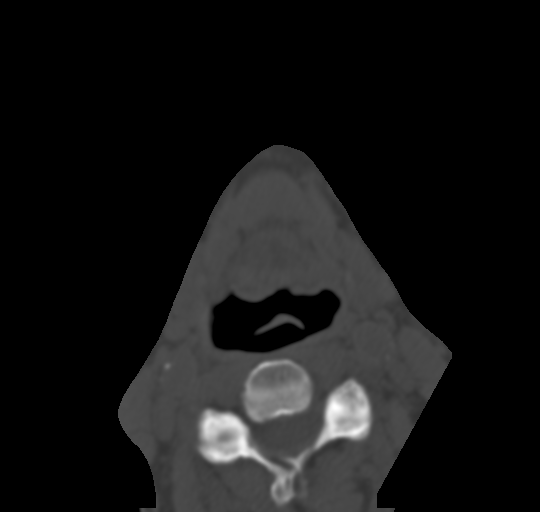
[im 15/85  bone]
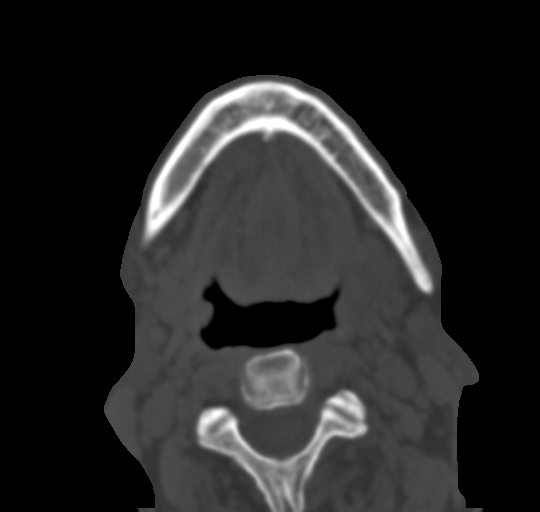
[im 24/85  bone]
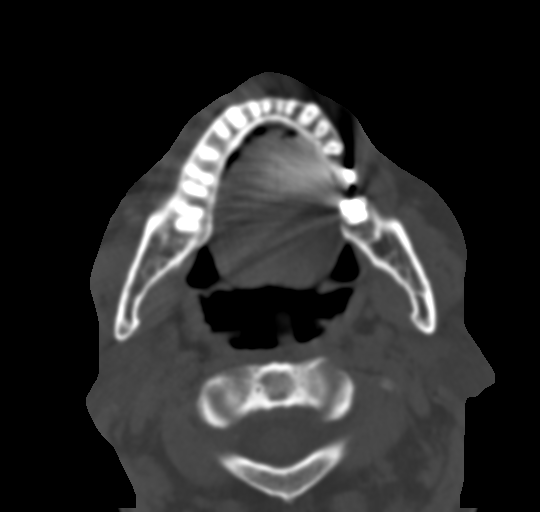
[im 32/85  bone]
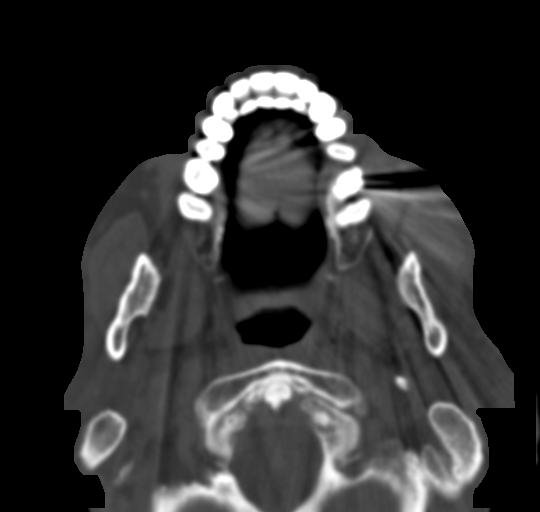
[im 44/85  brain]
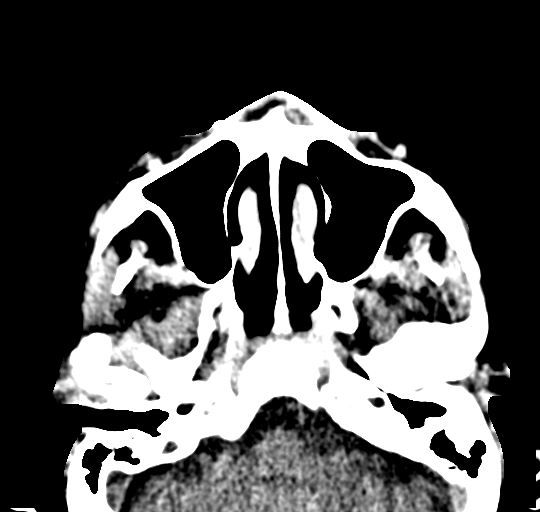
[im 44/85  bone]
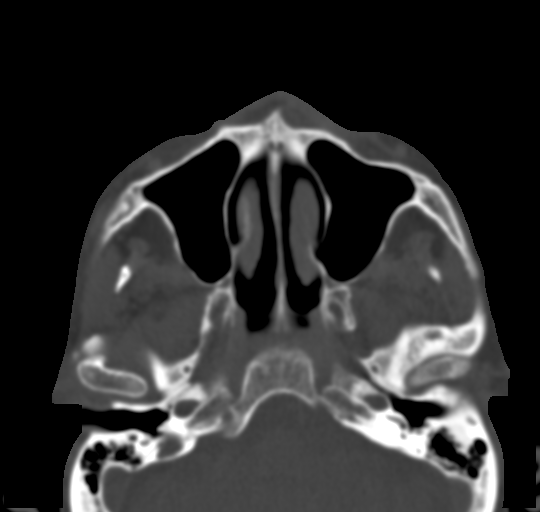
[im 53/85  bone]
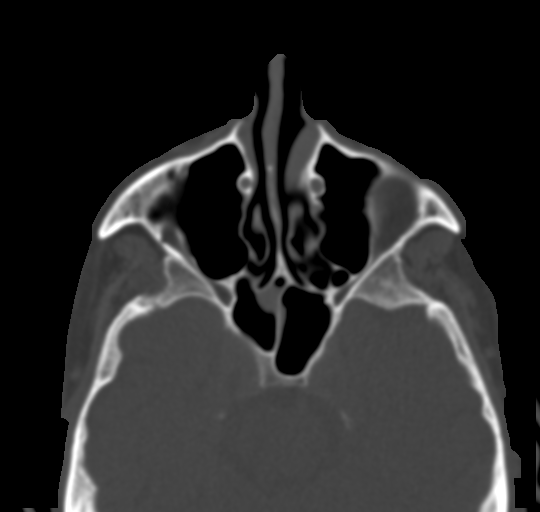
[im 61/85  bone]
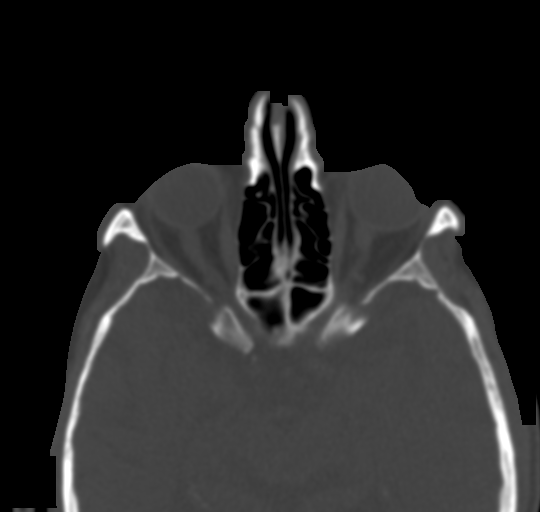
[im 70/85  bone]
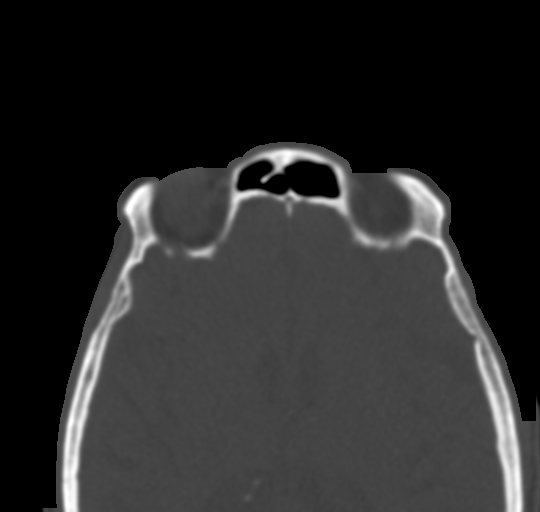
[im 79/85  brain]
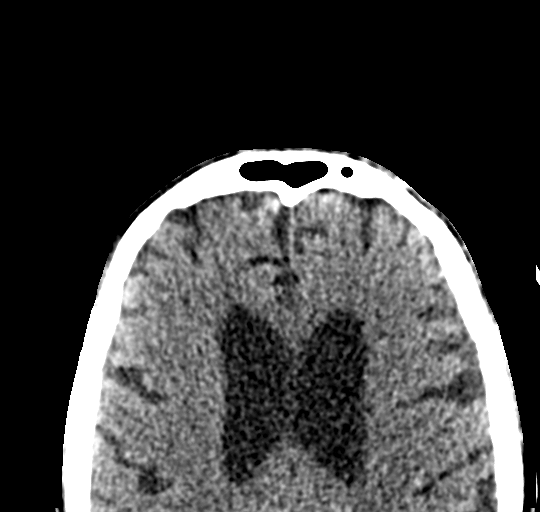
[im 79/85  bone]
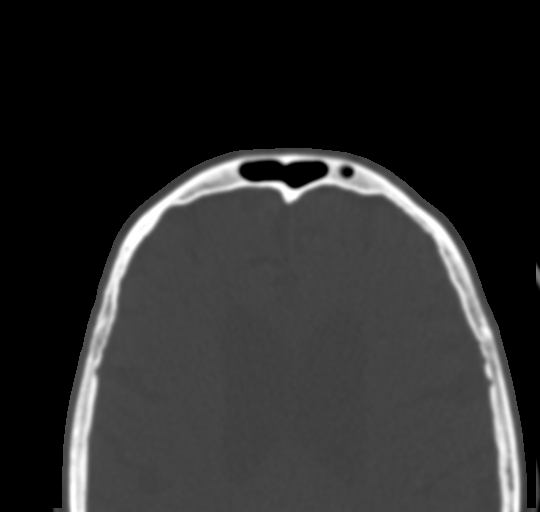

[Series 6: facialbone 2.0 cor st · coronal · 0.33mm/px · 3 of 78 slices shown]
[im 26/78  bone]
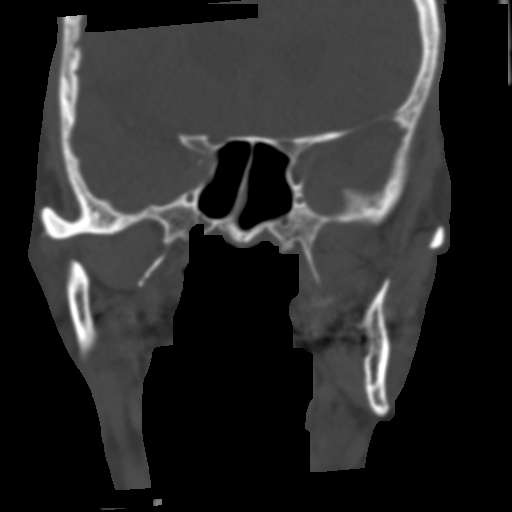
[im 35/78  bone]
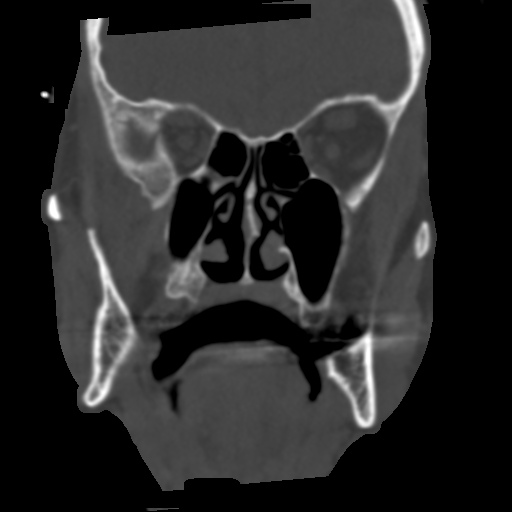
[im 43/78  bone]
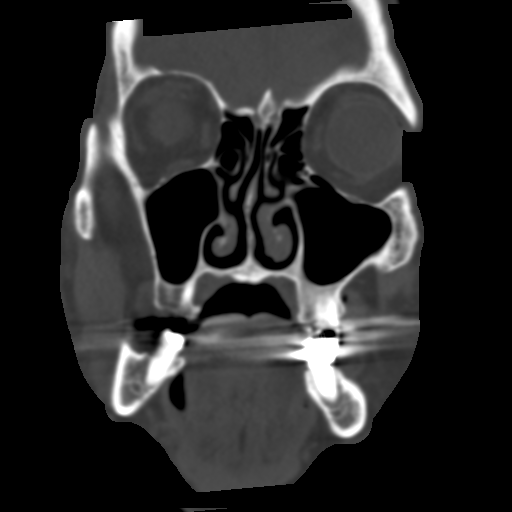

[Series 8: facialbone 2.0 sag st · sagittal · 0.31mm/px · 3 of 86 slices shown]
[im 29/86  bone]
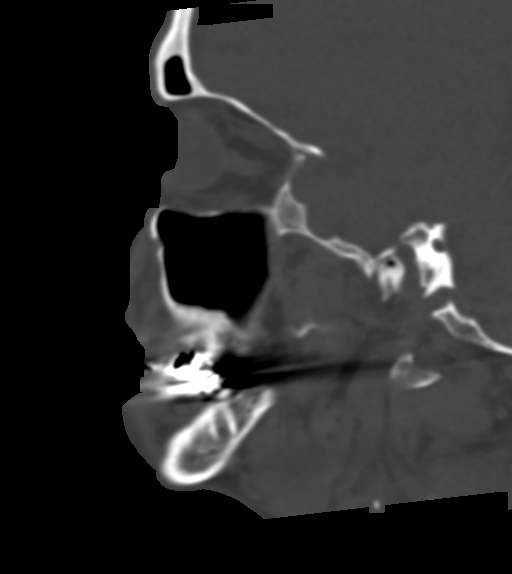
[im 43/86  bone]
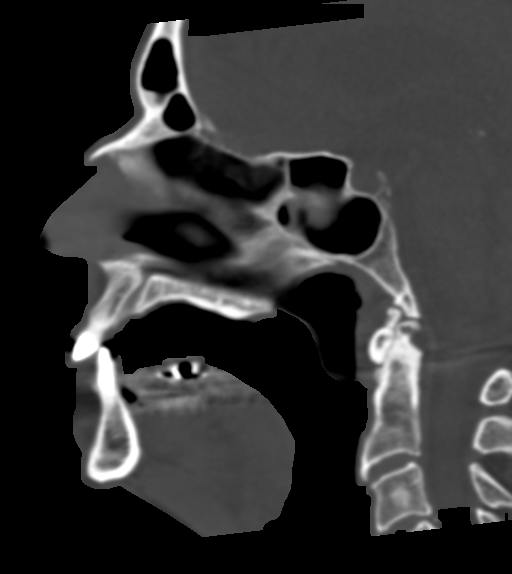
[im 57/86  bone]
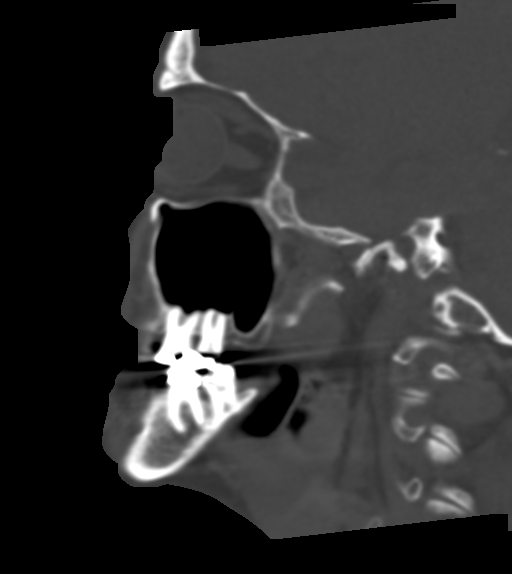

[15 of 47 positions shown; findings below may reference images not displayed]

FINDINGS: CT HEAD FINDINGS

Brain: Mild age-related atrophy and chronic microvascular ischemic
changes. There is no acute intracranial hemorrhage. No mass effect
or midline shift no extra-axial fluid collection.

Vascular: No hyperdense vessel or unexpected calcification.

Skull: Normal. Negative for fracture or focal lesion.

Other: None.

CT MAXILLOFACIAL FINDINGS

Osseous: No acute fracture or dislocation.

Orbits: The globes and retro-orbital fat are preserved.

Sinuses: Clear.

Soft tissues: Negative.

CT CERVICAL SPINE FINDINGS

Alignment: No subluxation.

Skull base and vertebrae: No acute fracture.

Soft tissues and spinal canal: No prevertebral fluid or swelling. No
visible canal hematoma.

Disc levels:  No acute findings.  Mild degenerative changes.

Upper chest: Negative.

Other: Bilateral carotid bulb calcified plaques.
IMPRESSION: 1. No acute intracranial pathology. Mild age-related atrophy and
chronic microvascular ischemic changes.
2. No acute facial bone fractures.
3. No acute cervical spine pathology.
